# Patient Record
Sex: Female | Born: 1945 | Hispanic: No | Marital: Single | Smoking: Former smoker
Health system: Western US, Academic
[De-identification: ages and names within clinical notes are randomized; demographics above are authoritative.]

## PROBLEM LIST (undated history)

## (undated) DIAGNOSIS — I10 Essential (primary) hypertension: Secondary | ICD-10-CM

## (undated) DIAGNOSIS — N63 Unspecified lump in unspecified breast: Secondary | ICD-10-CM

## (undated) DIAGNOSIS — Z780 Asymptomatic menopausal state: Secondary | ICD-10-CM

## (undated) DIAGNOSIS — R300 Dysuria: Secondary | ICD-10-CM

## (undated) DIAGNOSIS — K529 Noninfective gastroenteritis and colitis, unspecified: Secondary | ICD-10-CM

## (undated) DIAGNOSIS — Z1231 Encounter for screening mammogram for malignant neoplasm of breast: Secondary | ICD-10-CM

## (undated) DIAGNOSIS — M25561 Pain in right knee: Secondary | ICD-10-CM

## (undated) DIAGNOSIS — S42442A Displaced fracture (avulsion) of medial epicondyle of left humerus, initial encounter for closed fracture: Secondary | ICD-10-CM

## (undated) DIAGNOSIS — G4733 Obstructive sleep apnea (adult) (pediatric): Secondary | ICD-10-CM

## (undated) DIAGNOSIS — M1711 Unilateral primary osteoarthritis, right knee: Secondary | ICD-10-CM

## (undated) DIAGNOSIS — Z1239 Encounter for other screening for malignant neoplasm of breast: Secondary | ICD-10-CM

## (undated) DIAGNOSIS — Z0000 Encounter for general adult medical examination without abnormal findings: Secondary | ICD-10-CM

## (undated) DIAGNOSIS — K219 Gastro-esophageal reflux disease without esophagitis: Secondary | ICD-10-CM

## (undated) DIAGNOSIS — Z1159 Encounter for screening for other viral diseases: Secondary | ICD-10-CM

## (undated) HISTORY — PX: LYMPHADENECTOMY: SHX15

## (undated) HISTORY — PX: CATARACT EXTRACTION: SHX1313B

## (undated) HISTORY — PX: APPENDECTOMY: SHX54

## (undated) HISTORY — PX: BREAST LUMPECTOMY: SHX2

## (undated) HISTORY — PX: OTHER PROCEDURE: U1053

## (undated) HISTORY — DX: Noninfective gastroenteritis and colitis, unspecified: K52.9

## (undated) MED ORDER — ZOSTER VAC RECOMB ADJUVANTED 50 MCG/0.5ML IM SUSR
0.50 mL | Freq: Once | INTRAMUSCULAR | 1 refills | Status: AC
Start: 2019-05-01 — End: 2019-05-01

## (undated) MED ORDER — ALBUTEROL SULFATE 108 (90 BASE) MCG/ACT IN AERS
INHALATION_SPRAY | RESPIRATORY_TRACT | Status: AC
Start: 2021-05-14 — End: ?

## (undated) MED ORDER — DIPHENOXYLATE-ATROPINE 2.5-0.025 MG OR TABS
1.0000 | ORAL_TABLET | Freq: Four times a day (QID) | ORAL | 1 refills | Status: AC | PRN
Start: 2020-01-04 — End: ?

## (undated) MED ORDER — SIMVASTATIN 10 MG OR TABS
ORAL_TABLET | ORAL | 1 refills | Status: AC
Start: 2021-08-12 — End: ?

## (undated) MED ORDER — ALBUTEROL SULFATE 108 (90 BASE) MCG/ACT IN AERS
INHALATION_SPRAY | RESPIRATORY_TRACT | Status: AC
Start: 2021-05-08 — End: ?

---

## 2010-07-09 LAB — HM DEXA SCAN

## 2011-11-09 HISTORY — PX: TOTAL KNEE ARTHROPLASTY: SHX125

## 2013-11-08 HISTORY — PX: SHOULDER OPEN ROTATOR CUFF REPAIR: SHX2407

## 2016-06-17 NOTE — Progress Notes (Addendum)
Visit Type:  Follow-up Visit   Referring Provider:  [name], MD   Primary Provider:  [name], MD         History of Present Illness:   Rhonda [name] comes to the office today for an acute appointment.  She had an insect bite on her right inner elbow and that is resolving now but it has taken over a week.  She wanted to have this evaluated.  She also has a mole on her right arm in the antecubital area that has significantly enlarged.  Her husband has noticed that she snores quite loudly and she has had witnessed apneas.  She is also due for a breast exam and mammogram.          0 = no chance of dozing.     1 = slight chance      2 = moderate chance   3 = high chance      1. Sitting and reading? 0      2. Watching TV? 1      3. Sitting inactive in a public place (e.g in a meeting)? 0      4. As a passenger in a car for an hour or more without a break? 3      5. Lying down to rest in the afternoon? 3      6. Sitting and talking to someone? 0      7. Sitting quietly after a lunch without alcohol? 1      8. In a car, while driving and stopped for a few minutes in traffic? 0      SCORE: 8                           Hypertension HPI    Denies:  chest pain, palpitations, dyspnea with exertion, orthopnea, PND, peripheral edema, visual symptoms, neurologic problems, syncope   Any medication side effects?  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  no    Have you been or are you currently exposed to second hand smoke?  no      For women, in the past year how often have you had 4 or more drinks a day? never   In the past year, how often have you used prescription drugs for non medical reasons? (example: getting high) never   In the past year, how often have you used illegal drugs?  never      Exercise    How many times per week do you usually exercise? 0  How intense is your typical exercise?  Light (stretching slow walking or housework)      Nutrition    How would you describe your eating habits? pretty healthy   Do you have any concerns about what you eat? no I do not have concerns   Is there anything you would like to change? no      Motor Vehicle Safety    Do you fasten your seat belt when you are in a vehicle?  yes   Do you ever drive after drinking,or ride with a driver who has been drinking? no   Do you talk on your mobile device while driving? no   Do you text on your mobile device while driving?  no      Sun Exposure    How often are you outdoors? frequently   Do you wear UV protection while outdoors? yes      Falls Risk Information    Falls in the past 6 months? no   Are there difficulties with balance? no         Review of Systems       General        Denies fever, chills, sweats and fatigue.      CV        Denies chest pain/pressure, palpitations, fainting, shortness of breath w/exertion and trouble laying flat in bed.      Resp        Denies cough, shortness of breath and wheezing.      GI        Denies nausea, vomiting, diarrhea and constipation.         Vital Signs    Care Management:    Eligible (02/06/2015 12:00:00 AM)   Transition of Care:  PCP   Patient Identity Verified with 2 Methods  Yes   Nursing/MA Comments: needs mmx ordered.    pt got bit by an ant.   lesion on right arm near inner elbow needs to be looked at.       Pain Assessment    Are you having significant pain today that you would like to discuss with  the provider you are seeing today?no   BP Location:  Right arm   BP Cuff Size:  Regular   Method:  Manual      Final BP: 150 / 82   Blood Pressure #1: 160/84 mm Hg   Blood Pressure #2: 150/82 mm Hg   Pulse #2 66   Pulse rhythm: regular   Height: 65 inches    Entered Weight 227 lb Calculated Weight: 227 lb.  103.18kg   Body Mass Index: 37.91      Body Surface Area (m2): 2.09      Documented By: Tia MaskerJessica Tunks CMA (June 15, 2016 9:09 AM)      Medications were reviewed and reconciled with the patient during this visit including over the counter medications.          Allergies:    CECLOR (Critical)   PREDNISONE (Critical)      Allergies were reviewed with the patient during this visit.                  Physical Exam      General:       well developed, well nourished, in no acute distress.     Head:       normocephalic and atraumatic.     Eyes:       PERRL/EOM  intact, conjunctiva and sclera clear.   Breasts:       no masses, adenopathy or nipple discharge.     Lungs:       clear bilaterally to auscultation.     Heart:       non-displaced PMI, chest non-tender; regular rate and rhythm, S1, S2 without murmurs, rubs, or gallops   Abdomen:        normal bowel sounds; no hepatosplenomegaly no ventral,umbilical hernias or masses noted.     Skin:       there is an area of resolving erythema on the medial elbow with no signs of cellulitis.  There is a mole that has enlarged greatly on her right lateral antecubital area.    Psych:       alert and cooperative; normal mood and affect; normal attention span and concentration.           Impression & Recommendations:      Problem # 1:  Insect bite (ICD-919.4) (ZOX09-U04.5)   She was reassured that this looks like it is healing well.  There is nothing to do at this time.       Problem # 2:  Skin lesion (ICD-709.9) (ICD10-L98.9)   She will schedule an appointment to have this excised.       Problem # 3:  Sleep apnea (ICD-780.57) (ICD10-G47.30)    She will be set up for a sleep study based on her witnessed apneas.    Orders:   Sleep Study (CPT-00000)         Problem # 4:  Other screening mammogram (ICD-V76.12) (WUJ81-X91.47)   Her mammogram is ordered today   Orders:   Screening Bilat Mammo-Other (CPT-G0202)                     Orders:   Added new Test order of Sleep Study (CPT-00000) - Signed   Added new Test order of Screening Bilat Mammo-Other (CPT-G0202) - Signed   Added new Service order of Ofc Vst, Est Lvl 4 ([id number]) - Signed            I have either personally documented or reviewed the patients past medical, surgical, family and social history and review of systems if present in the note and when recorded by clinical staff.         Electronically signed by [name] MD on 06/15/2016 at 10:29 AM   ________________________________________________________________________

## 2016-06-28 NOTE — Telephone Encounter (Signed)
Call Type    PRIORITY Normal      pt wants to know if she needs to take antibiotics prior to her procedure tomorrow. please advise. [name] CMA, Rhonda  June 28, 2016 1:45 PM      the pt staes that she has 4 of them on hand that she ususally takes them  prior to going to the dentist  or anything that she ahs done that is going to be breaking the skin since she has had a knee replacement. the best call back is [phone].Rhonda JubileeBooker, Rhonda  June 28, 2016 1:48 PM      Is this for her colonoscopy? If so, that doesn't break the skin so she doens't need to.  It honestly won't hurt if she does. Theador Jezewski MD, Judie GrieveBryan  June 28, 2016 2:08 PM      shes coming here for a procedure tomorrow. it just says "elbow" on the appt note so I dont know what its for. [name] CMA, Rhonda  June 28, 2016 2:45 PM      pt called back and is waiting to hear if she needs to take her antibiotics prior to her procedure tomorrow with you for her elbow. please advise. Rhonda JubileeBooker, Rhonda  June 28, 2016 3:32 PM       I spoke with Dr Juliann PulseLundquist then called the pt and let her know she should take the antibiotics prior to her procedure. Rhonda JubileeBooker, Rhonda  June 28, 2016 3:43 PM         Electronically signed by Rhonda JubileeKatina [name] on 06/28/2016 at 3:43 PM   ________________________________________________________________________

## 2016-07-07 LAB — HM MAMMOGRAPHY
Mammography, External: NORMAL
Mammography, External: NORMAL

## 2016-07-08 NOTE — Progress Notes (Addendum)
Vital Signs    Care Management:    Eligible (02/06/2015 12:00:00 AM)   Height: 65 inches      Final BP:  /                Suture Removal    Location of Sutures: right inner elbow   Sutures placed by:  Johnn HaiBryan Kahlen Boyde, MD   Sutures placed on:  06/29/2016   Number of sutures removed: 2   Wound Appearance healed, no signs of infection   Signs of infection? No   Complicated removal? No   Sutures removed by: Tia MaskerJessica Tunks CCMA       Sent to Provider for Review and Signature            Electronically signed by Johnn HaiBryan Shamila Lerch MD on 07/06/2016 at 8:18 AM   ________________________________________________________________________

## 2016-07-08 NOTE — Progress Notes (Addendum)
Visit Type:  procedure   Referring Provider:  Johnn HaiBryan Sariah Henkin, MD   Primary Provider:  Johnn HaiBryan Nicole Hafley, MD         History of Present Illness:   Rhonda [name] comes to the office today to have the skin lesion on her right antecubital fossa removed.  This has increased in size and has changed color and shape over the last year.  It has started to bleed on occasion as well.  She is concerned because of these sudden changes.                                     Depression Screening    Over the last 2 weeks has often been bothered by feeling down, depressed or hopeless:  No   Over the last 2 weeks has often been bothered by little interest or pleasure in doing things:  No      Tobacco Use    Never smoker      Do you use other tobacco products?  no   Have you been or are you currently exposed to second hand smoke?  no      For women, in the past year how often have you had 4 or more drinks a day? never   In the past year, how often have you used prescription drugs for non medical reasons? (example: getting high) never   In the past year, how often have you used illegal drugs?  never      Exercise    How many times per week do you usually exercise? 0   How intense is your typical exercise?  Light (stretching slow walking or housework)      Nutrition    How would you describe your eating habits? pretty healthy   Do you have any concerns about what you eat? no I do not have concerns   Is there anything you would like to change? no      Motor Vehicle Safety    Do you fasten your seat belt when you are in a vehicle?  yes   Do you ever drive after drinking,or ride with a driver who has been drinking? no   Do you talk on your mobile device while driving? no   Do you text on your mobile device while driving?  no      Sun Exposure    How often are you outdoors? frequently   Do you wear UV protection while outdoors? yes      Falls Risk Information    Falls in the past 6 months? no   Are there difficulties with balance? no          Review of Systems       General        Denies headaches, fever, chills and sweats.         Vital Signs    Care Management:    Eligible (02/06/2015 12:00:00 AM)   Transition of Care:  PCP   Patient Identity Verified with 2 Methods  Yes   Nursing/MA Comments: right arm near inner elbow. changing lesion      Pain Assessment    Are you having significant pain today that you would like to discuss with the provider you are seeing today?no   BP Location:  Right arm   BP Cuff Size:  Regular   Method:  Manual      Final  BP: 132 / 80   Blood Pressure: 132/80 mm Hg   Pulse #1 66   Pulse rhythm: regular   Height: 65 inches    Entered Weight 228 lb Calculated Weight: 228 lb.  103.64kg   Body Mass Index: 38.08      Body Surface Area (m2): 2.09      Documented By: Tia Masker CMA (June 29, 2016 8:18 AM)      Medications were reviewed and reconciled with the patient during this visit including over the counter medications.          Allergies:    CECLOR (Critical)   PREDNISONE (Critical)      Allergies were reviewed with the patient during this visit.                  Physical Exam      General:       well developed, well nourished, in no acute distress.     Skin:       There is a mole that has enlarged greatly on her right lateral antecubital area.          Impression & Recommendations:      Problem # 1:  Skin lesion (ICD-709.9) (ICD10-L98.9)   A time out was performed prior to the minimally invasive procedure checking for correct patient, correct site, correct procedure and necessary equipment, including ,if applicable, the correct  needle length and technique.  An informed consent was reviewed and signed by the patient. The area was then prepped in sterile fashion and 10ml of 1% lidocaine with epinephrine was used to achieve adequate anesthesia.  After adequate anesthesia was verified an elliptical incision was used to excise the lesion which was 1x1x3cm and it was placed in a formain containing  specimen cup.  Hemostasis was achieved partially with TCA and then with closure of the incision.  4-0 ethilon was used and a mattress was placed to alleviate tension on the incision and then a running subcuticular suture was used for final closure.  A sterile dressing was placed.  She tolerated the procedure well, and post procedure instructions were given. She will follow up in 7-10 days for suture removal.    Orders:   Excision, benign lesion, trunk, arms or legs; excised diameter 2.1 to 3.0 cm (11403)                     Orders:   Added new Service order of Excision, benign lesion, trunk, arms or legs; excised diameter 2.1 to 3.0 cm (11403) - Signed            I have either personally documented or reviewed the patients past medical, surgical, family and social history and review of systems if present in the note and when recorded by clinical staff.         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, including shoulder (ICD-173.61) (ICD10-C44.612)   Orders:   Added new Service order of Excision;malignant les(exc skin tag)trunk,arms,legs 2.1 to 3.0 cm ([id number]) - Signed                     Electronically signed by [name] MD on 07/06/2016 at 11:45 AM   ________________________________________________________________________   Clinical Lists Changes  Orders:   Added new Service order of Excision;malignant les(exc skin tag)trunk,arms,legs 2.1 to 3.0 cm ([id number]) - Signed                     Electronically signed by [name] MD on 07/08/2016 at 12:33 PM   ________________________________________________________________________

## 2016-07-13 NOTE — Telephone Encounter (Addendum)
Call Type    Admin/Sched   PRIORITY Normal   Initial Call Started: [name],  June 22, 2016 10:57 AM   Rescheduled/Cancelled Appt Comment pts husband claled to schedk status of sleep study, he is very upset that these havent been taken care of yet, tried to explain the process hre didnt want to hear it, there is a note in his chart as well             I have sent husbands doc to [facility]. Tunks CMA, [name]  June 22, 2016 11:14 AM      [name], have you talked to either of them yet? Milayah Krell MD, [name]  June 22, 2016 11:46 AM      I called [facility] and they will call the patient to schedule. Referral faxed to [facility] at fax [phone] per their request. ..................................................................Marland KitchenDenna Haggard  June 22, 2016 11:47 AM      I have not.  I will touch base with them at the end of this week.  It sounds like it's moving along. [name]  June 22, 2016 12:44 PM      Thank you. [name] MD, [name]  June 22, 2016 1:12 PM         Electronically signed by [name] MD on 06/22/2016 at 1:12 PM   ________________________________________________________________________   [name].  Pt is scheduled on 9/11 at 8pm.          Electronically signed by [name] on 07/13/2016 at 8:03 AM   ________________________________________________________________________

## 2016-07-19 NOTE — Telephone Encounter (Addendum)
Call Type    Admin/Sched   PRIORITY Normal   Initial Call Started: Rhonda [name],  July 16, 2016 8:10 AM   Rescheduled/Cancelled Appt Comment Dr Juliann PulseLundquist has to be out on Monday, please reschedule this patient.       I called and spoke with [name] and explained to him that I was calling because pt. has an appt. on Monday with Dr. Juliann PulseLundquist and he is going to be out of the office that day and was calling to reschedule.  Rhonda [name] was upset and told me to just cancel the appt. all together because they called on Wednesday and Dr. Juliann PulseLundquist was not in that day and they had to wait until Monday for her to be seen, and now he's not going to be in the office again and they have to reschedule.  He said to just forget it and cancel the appt.  I have cancelled pts. appt.   Rhonda [name], Rhonda [name]  July 16, 2016 8:46 AM         Electronically signed by Rhonda Leaderhristy [name] Rhonda [name] on 07/16/2016 at 8:46 AM   Electronically signed by Rhonda HaiBryan Trever Streater MD on 07/18/2016 at 11:55 PM   ________________________________________________________________________

## 2016-08-23 LAB — HM COLONOSCOPY

## 2016-10-22 NOTE — Progress Notes (Signed)
Visit Type:  Follow-up Visit   Referring Provider:  [name], MD   Primary Provider:  [name], MD         History of Present Illness:   Rhonda [name] comes to the office today for a follow up appointment on her recent sleep study.  She informed me that she was told she has significant sleep apnea and she was started on positive pressure therapy for the titration phase.  She was amazed at what a difference it made in her sleep in just a short amount of time.  She has a number of questions about CPAP and OSA and how this process works.                                     Depression Screening    Over the last 2 weeks has often been bothered by feeling down, depressed or hopeless:  No   Over the last 2 weeks has often been bothered by little interest or pleasure in doing things:  No      Tobacco Use    Never smoker      Do you use other tobacco products?  no   Have you been or are you currently exposed to second hand smoke?  no      For women, in the past year how often have you had 4 or more drinks a day? never   In the past year, how often have you used prescription drugs for non medical reasons? (example: getting high) never   In the past year, how often have you used illegal drugs?  never      Exercise    How many times per week do you usually exercise? 0   How intense is your typical exercise?  Light (stretching slow walking or housework)      Nutrition    How would you describe your eating habits? pretty healthy   Do you have any concerns about what you eat? no I do not have concerns   Is there anything you would like to change? no      Motor Vehicle Safety    Do you fasten your seat belt when you are in a vehicle?  yes   Do you ever drive after drinking,or ride with a driver who has been drinking? no   Do you talk on your mobile device while driving? no   Do you text on your mobile device while driving?  no      Sun Exposure    How often are you outdoors? frequently    Do you wear UV protection while outdoors? yes      Falls Risk Information    Falls in the past 6 months? no   Are there difficulties with balance? no            Vital Signs    Care Management:    Eligible (02/06/2015 12:00:00 AM)   Transition of Care:  PCP   Patient Identity Verified with 2 Methods  Yes   Nursing/MA Comments: follow up   failed sleep test.   trouble with hips, arthritis? [id number]      Pain Assessment    Are you having significant pain today that you would like to discuss with the provider you are seeing today?no   BP Location:  Right arm   BP Cuff Size:  Regular   Method:  Manual  Final BP: 132 / 78   Blood Pressure: 132/78 mm Hg   Pulse #1 66   Pulse rhythm: regular   Height: 65 inches    Entered Weight 226 lb Calculated Weight: 226 lb.  102.73kg   Body Mass Index: 37.74      Body Surface Area (m2): 2.09      Documented By: Tia MaskerJessica Tunks CCMA (August 12, 2016 7:59 AM)      Medications were reviewed and reconciled with the patient during this visit including over the counter medications.          Allergies:    CECLOR (Critical)   PREDNISONE (Critical)      Allergies were reviewed with the patient during this visit.                  Physical Exam      General:       well developed, well nourished, in no acute distress.     Head:       normocephalic and atraumatic.     Psych:       alert and cooperative; normal mood and affect; normal attention span and concentration.           Impression & Recommendations:      Problem # 1:  Sleep apnea (ICD-780.57) (ICD10-G47.30)   The majority of today's 45 minute appointment was spent in counseling and coordination of care. We discussed OSA and the associated complications of this diagnosis.  We also discussed obstrutive vs central sleep apnea as that came up in discussion at the sleep lab.  We further discussed how the devices work and the differences between an autoset and a fixed CPAP /  BiPAP as well as the difference between these and an ASV device.  She will be set up with a device as soon as I have the report from the sleep lab.                   Orders:   Added new Service order of Ofc Vst, Est Lvl 5 ([id number]) - Signed            I have either personally documented or reviewed the patients past medical, surgical, family and social history and review of systems if present in the note and when recorded by clinical staff.         Electronically signed by Johnn HaiBryan Zayden Maffei MD on 08/12/2016 at 8:52 AM   ________________________________________________________________________   Flu Shot Visit            Influenza Vaccine      Vaccine Type: Fluzone PF Quadrivalent      Site: L arm      Mfr: Sanofi Pasteur      Dose: 0.5 ml      Route: IM      Given by: Tia MaskerJessica Tunks CCMA      Exp. Date: 05/07/2017      Lot #: [id number]      VIS given: 06/14/14 version given August 13, 2016.      Flu Vaccine Consent Questions       Do you have a history of severe allergic reactions to this vaccine? no      Any prior history of allergic reactions to egg and/or gelatin? no      Do you have a sensitivity to the preservative Thimersol? no      Do you have a past history of Guillan-Barre Syndrome? no  Do you currently have an acute febrile illness? no      Have you ever had a severe reaction to latex? no      Vaccine information given and explained to patient? yes      Are you currently pregnant? no         Orders:   Added new Service order of Fluzone PF Quadrivalent ([id number]) - Signed   Added new Service order of Immunization admin #1 [phone]([id number]) - Signed      A time out was performed checking for correct patient; correct vaccine or diluents; correct time (includes administering at correct age, appropriate interval and before vaccine or diluent expires); correct dosage; correct route, needle length and technique; correct site and correct documentation.  It was verified that the patient is not allergic to the  vaccine and/or medication being given. The patient is not currently moderately or severely ill.         Electronically signed by Tia MaskerJessica Tunks CCMA on 08/13/2016 at 9:13 AM   Electronically signed by Johnn HaiBryan Dorsie Sethi MD on 08/13/2016 at 9:43 AM   ________________________________________________________________________   Update Service Orders         Orders:   Added new Service order of Fluzone PF Quadrivalent ([id number]) - Signed   Added new Service order of Immunization admin #1 ([id number]) - Signed               Influenza Vaccine      Vaccine Type: Fluzone PF Quadrivalent      Site: R arm      Mfr: Sanofi Pasteur      Dose: 0.5 ml      Route: IM      Given by: Tia MaskerJessica Tunks CCMA      Exp. Date: 05/07/2017      Lot #: [id number]      VIS given: 06/14/14 version given September 02, 2016.      Flu Vaccine Consent Questions       Do you have a history of severe allergic reactions to this vaccine? no      Any prior history of allergic reactions to egg and/or gelatin? no      Do you have a sensitivity to the preservative Thimersol? no      Do you have a past history of Guillan-Barre Syndrome? no      Do you currently have an acute febrile illness? no      Have you ever had a severe reaction to latex? no      Vaccine information given and explained to patient? yes      Are you currently pregnant? no         A time out was performed checking for correct patient; correct vaccine or diluents; correct time (includes administering at correct age, appropriate interval and before vaccine or diluent expires); correct dosage; correct route, needle length and technique; correct site and correct documentation.  It was verified that the patient is not allergic to the vaccine and/or medication being given. The patient is not currently moderately or severely ill.      this was put in on the wrong service date, so this is why it has been changed.Ramiro Harvest. Tunks CCMA, [name]  September 02, 2016 8:25 AM          Electronically signed by Tia MaskerJessica Tunks CCMA on 09/02/2016 at 8:25 AM   Electronically signed by Johnn HaiBryan Treylan Mcclintock MD on 09/02/2016 at 8:29 AM   ________________________________________________________________________

## 2016-10-22 NOTE — Progress Notes (Signed)
Visit Type:  Follow-up Visit   Referring Provider:  Johnn HaiBryan Calil Amor, MD   Primary Provider:  Johnn HaiBryan Keeya Dyckman, MD         History of Present Illness:   Rhonda MccreedyBarbara comes to the office today for an acute visit due to pain in her left buttock.  She has not had any injuries that she is aware of but had a sudden onset of pain in her left buttock occur about a week ago.  This has limited her ability to walk and her ability to get up and down from a seated position.  She does not have any numbness or tingling associated with this. She has tried OTC medications and heat and these have not been effective in alleviating her symptoms.                                     Depression Screening    Over the last 2 weeks has often been bothered by feeling down, depressed or hopeless:  No   Over the last 2 weeks has often been bothered by little interest or pleasure in doing things:  No      Tobacco Use    Never smoker      Do you use other tobacco products?  no   Have you been or are you currently exposed to second hand smoke?  no      For women, in the past year how often have you had 4 or more drinks a day? never   In the past year, how often have you used prescription drugs for non medical reasons? (example: getting high) never   In the past year, how often have you used illegal drugs?  never      Exercise    How many times per week do you usually exercise? 0   How intense is your typical exercise?  Light (stretching slow walking or housework)      Nutrition    How would you describe your eating habits? pretty healthy   Do you have any concerns about what you eat? no I do not have concerns   Is there anything you would like to change? no      Motor Vehicle Safety    Do you fasten your seat belt when you are in a vehicle?  yes   Do you ever drive after drinking,or ride with a driver who has been drinking? no   Do you talk on your mobile device while driving? no   Do you text on your mobile device while driving?  no       Sun Exposure    How often are you outdoors? frequently   Do you wear UV protection while outdoors? yes      Falls Risk Information    Falls in the past 6 months? no   Are there difficulties with balance? no         Review of Systems       General        Denies headaches, fever, chills, sweats and fatigue.         Vital Signs    Care Management:    Eligible (02/06/2015 12:00:00 AM)   Transition of Care:  PCP   Patient Identity Verified with 2 Methods  Yes   Nursing/MA Comments: left hip bothering. last few days been getting worse. has been hurting for a while. no known  injury or fall. ROM is decreased. hurts to get into car      Pain Assessment    Are you having significant pain today that you would like to discuss with the provider you are seeing today?yes   BP Location:  Right arm   BP Cuff Size:  Regular   Method:  Manual      Final BP: 126 / 80   Blood Pressure: 126/80 mm Hg   Pulse #1 64   Pulse rhythm: regular   Height: 65 inches    Entered Weight 231 lb Calculated Weight: 231 lb.  105.00kg   Body Mass Index: 38.58      Body Surface Area (m2): 2.10      Documented By: Tia MaskerJessica Tunks CCMA (September 23, 2016 2:57 PM)      Medications were reviewed and reconciled with the patient during this visit including over the counter medications.          Allergies:    CECLOR (Critical)   PREDNISONE (Critical)      Allergies were reviewed with the patient during this visit.                  Physical Exam      General:       well developed, well nourished, in no acute distress.     Head:       normocephalic and atraumatic.     Eyes:       PERRL/EOM intact, conjunctiva and sclera clear.   Msk:       Examination of her left buttock demonstrates tenderness over the left piriformis muscle which is in spasm.  Oppositional testing of piriformis also reproduces her pain.    Psych:       alert and cooperative; normal mood and affect; normal attention span and concentration.           Impression & Recommendations:       Problem # 1:  Piriformis muscle spasm, left (ICD-728.85) (ICD10-M62.838)   As she has failed OTC NSAIDs, she will be treated with steroids and will utilize ice massage.  She will let me know in 10 days how she is doing and if she has not had significant improvement, she will be referred to PT for further evaluation and management.       Medications Added to Medication List This Visit:   1)  Medrol 4 Mg Oral Tablet Therapy Pack (Methylprednisolone) .... Take as directed, tapering over 6 days         Prescriptions:   MEDROL 4 MG ORAL TABLET THERAPY PACK (METHYLPREDNISOLONE) Take as directed, tapering over 6 days  #1[Package] x 0       Entered and Authorized by:  Johnn HaiBryan Brigido Mera MD       Electronically signed by:   Johnn HaiBryan Lyliana Dicenso MD on 09/23/2016       Method used:    Electronically to                [facility]* (retail)               [address]               [place], [place]  [id number]               Ph: [phone]               Fax: [phone]       RxID:   [id number]  Orders:   Added new Service order of Ofc Vst, Est Lvl 4 ([id number]) - Signed            I have either personally documented or reviewed the patients past medical, surgical, family and social history and review of systems if present in the note and when recorded by clinical staff.         Electronically signed by [name] MD on 09/23/2016 at 3:26 PM   ________________________________________________________________________

## 2016-10-22 NOTE — Progress Notes (Signed)
Visit Type:  Follow-up Visit   Referring Provider:  [name], MD   Primary Provider:  [name], MD         History of Present Illness:   Rhonda [name] comes to the office today for a routine follow up appointment on her OSA, HTN, and buttock pain.  She reports that her OSA is well controlled and she is pleased that the device is working as well as it has been. She has not had any problems with the machine from her end of things, though apparently it did have a bad modem and didn't transmit for a while.  She feels that her blood pressure is well controlled and she has not had symtpoms of elevation or orthostasis.  She did take the medrol dose pack for her piriformis syndrome, and that was helpful the first few days.  However, when she reduced the dose, she noticed return of pain, but she also started to have headaches and other side effects from the medication, thus she stopped it.  She has been taking her meloxicam and that has not been beneficial.  She has not been to PT for this issue in the past.                            Hypertension HPI    Denies:  headache, chest pain, palpitations, dyspnea with exertion, orthopnea, PND, peripheral edema, visual symptoms, neurologic problems, syncope   Any medication side effects?  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  no   Have you been or are you currently exposed to second hand smoke?  no       For women, in the past year how often have you had 4 or more drinks a day? never   In the past year, how often have you used prescription drugs for non medical reasons? (example: getting high) never   In the past year, how often have you used illegal drugs?  never      Exercise    How many times per week do you usually exercise? 0   How intense is your typical exercise?  Light (stretching slow walking or housework)      Nutrition    How would you describe your eating habits? pretty healthy   Do you have any concerns about what you eat? no I do not have concerns   Is there anything you would like to change? no      Motor Vehicle Safety    Do you fasten your seat belt when you  are in a vehicle?  yes   Do you ever drive after drinking,or ride with a driver who has been drinking? no   Do you talk on your mobile device while driving? no   Do you text on your mobile device while driving?  no      Sun Exposure    How often are you outdoors? frequently   Do you wear UV protection while outdoors? yes      Falls Risk Information    Falls in the past 6 months? no   Are there difficulties with balance? no         Review of Systems       General        Denies headaches, fever, chills, sweats and fatigue.      CV        Denies chest pain/pressure, palpitations, fainting, shortness of breath w/exertion, trouble laying flat in bed, wake up at night/can't breathe and leg cramps.      Resp        Denies cough, shortness of breath and wheezing.         Vital Signs    Care Management:    Eligible (02/06/2015 12:00:00 AM)   Transition of Care:  PCP   Patient Identity Verified with 2 Methods  Yes   Nursing/MA Comments: left leg pain, radiates to calf. hard time walking- throwing off right knee.    cpap seems to be working.       Pain Assessment    Are you having significant pain today that you would like to discuss with the provider you are seeing today?yes   BP Location:  Right arm   BP Cuff Size:  Regular   Method:  Manual       Final BP: 132 / 80   Blood Pressure: 132/80 mm Hg   Pulse #1 68   Pulse rhythm: regular   Height: 65 inches    Entered Weight 229 lb Calculated Weight: 229 lb.  104.09kg   Change since last visit: -2 pounds    Body Mass Index: 38.25      Body Surface Area (m2): 2.10      Documented By: Tia MaskerJessica [name] CCMA (October 07, 2016 7:38 AM)      Medications were reviewed and reconciled with the patient during this visit including over the counter medications.          Allergies:    CECLOR (Critical)   PREDNISONE (Critical)      Allergies were reviewed with the patient during this visit.                  Physical Exam      General:       well developed, well nourished, in no acute distress.     Head:       normocephalic and atraumatic.     Eyes:       PERRL/EOM intact, conjunctiva and sclera clear.   Lungs:       clear bilaterally to auscultation.     Heart:       non-displaced PMI, chest non-tender; regular rate and rhythm, S1, S2 without murmurs, rubs, or gallops   Abdomen:        normal bowel sounds; no hepatosplenomegaly no ventral,umbilical hernias or masses noted.     Psych:       alert and cooperative; normal mood and affect; normal attention span and concentration.  Impression & Recommendations:      Problem # 1:  Piriformis muscle spasm, left (ICD-728.85) (ICD10-M62.838)   We dicussed that use of muscle relaxant medications and steroids is usually helpful.  However, she is intolerant to steroids at this point in time.  She does have Cyclobenzaprine at home and she will start taking this.  I will refer her to PT and she will also see Dr. Darius BumpSaulter DC.  To help get the pain settled for PT and the chiropractor, she will be prescribed Vicoden for a week.       Problem # 2:  Sleep apnea (ICD-780.57) (ICD10-G47.30)   Excellent control on her current CPAP settings.  The download was reviewed and will be scanned into the chart.       Problem # 3:  Hypertension, benign (ICD-401.1) (ICD10-I10)    Well managed, no changes required at this time.       Her updated medication list for this problem includes:      Lisinopril-hydrochlorothiazide 20-25 Mg Oral Tablet (Lisinopril-hydrochlorothiazide) .Marland Kitchen.... 1 tablet by mouth once a day      Lisinopril 20 Mg Oral Tablet (Lisinopril) .Marland Kitchen.... 1 tablet by mouth once a day at bedtime         Medications Added to Medication List This Visit:   1)  Hydrocodone-acetaminophen 10-325 Mg Oral Tablet (Hydrocodone-acetaminophen) .... One every six hours as needed for severe pain.  acute pain         Prescriptions:   HYDROCODONE-ACETAMINOPHEN 10-325 MG ORAL TABLET (HYDROCODONE-ACETAMINOPHEN) one every six hours as needed for severe pain.  Acute pain  #20 x 0       Entered and Authorized by:  Johnn HaiBryan Herbie Lehrmann MD       Electronically signed by:   Johnn HaiBryan Korbin Mapps MD on 10/07/2016       Method used:    Print then Give to Patient       RxID:   [id number]               Orders:   Added new Service order of Ofc Vst, Est Lvl 4 ([id number]) - Signed            I have either personally documented or reviewed the patients past medical, surgical, family and social history and review of systems if present in the note and when recorded by clinical staff.         Electronically signed by Johnn HaiBryan Saveon Plant MD on 10/07/2016 at 8:35 AM   ________________________________________________________________________

## 2016-10-22 NOTE — Telephone Encounter (Signed)
Patient Identity Verified with 2 Methods        Call Type    VM   PRIORITY Normal   Initial Call Started: Ella JubileeKatina Booker,  October 15, 2016 12:54 PM   Spoke to: Spouse phillip    Call For: Johnn HaiBryan Oluwatimileyin Vivier MD    Other    [phone]   Date of Voicemail 10/15/2016   VM Message:   the pt husband Aneta Minshillip called and left a VM the pt is worse the pain has not subsided since the pt has seen Dr Juliann PulseLundquist a few weeks ago. the pain is worse and she can hardly move.  she has had 4 visits with Dr Darius Bumpsaulter and he is not sure what it is . he thinks it could be a pinched nerve or goes back to a fall she had down the stairs aways back and nothing was done about it. he said that the pain meds that she has she can take it makes her sick and she can not do anything. he thinks it is time to do something else such as xrays or a MRI and have it set up emideatly. he wants a call asap.          Follow-up for Phone Call    Follow-up Details: pc to pt.  She doesnt have any numbness or weakness in her legs,  The pain goes from middle of back down the outside of the left side.  Worse standing, couldn't sleep, used icepacks all night, got into recliner and was able to doze off and on.  She saw the chiropracter today and there was no pain with the treatment.  13 years ago she fell down 13 steps and didnt get propper tx.  Pt has (per spouse) herniated disk, bulging disk etc.  chiropracter says pelvic is out of line with spine so possibly a pinched nerve.        Dr Juliann PulseLundquist gave her Vicodin 1 tab every 6 hours but the first she took made her sick to her stomach.  so she has tried using half at a time and it makes her kind of disoriented and sick to her stomach.      They are using ice packs, pillow under knees as well.  would like MRI and additional information please.      Please call spouse.   Follow-up by: Annamary CarolinSharon McCarthy CMA, AAMA,  October 15, 2016 2:35 PM         New Orders:   Referral - PT [CPT-000000]   LS Spine xray [[id number]]       I don't see what happened to the PT order, but I have resent this.  I have also entered an order for a LS spine radiograph. Nyanna Heideman MD, Judie GrieveBryan  October 15, 2016 4:18 PM      pc to pt-got answering machine and left specific message regarding PT (explained pt is  NOT chirporactic service) [facility] and asked them to call there Monday to see if they can expedite appt.  also explained the [facility] for the [facility] and asked them to go in as walk-in as soon as possible.  they should call the hospital and ask if possible to get done on Saturday but if not to go up Monday and again it is as a Walk In.      THey will touch base here on Monday also to update.      ..................................................................Marland Kitchen.Annamary CarolinSharon McCarthy CMA, AAMA  October 15, 2016 4:27 PM         Electronically signed by Annamary CarolinSharon McCarthy CMA, AAMA on 10/15/2016 at 4:28 PM   Electronically signed by Johnn HaiBryan Lorielle Boehning MD on 10/15/2016 at 4:58 PM   ________________________________________________________________________   husband called and requested [facility]- [facility].          Electronically signed by Cleda Mccreedyristina Lopez CMA AAMA on 10/15/2016 at 4:39 PM   Electronically signed by Johnn HaiBryan Loletha Bertini MD on 10/15/2016 at 4:58 PM   ________________________________________________________________________

## 2016-10-25 NOTE — Telephone Encounter (Addendum)
Call Type    Admin/Sched   PRIORITY Normal   Initial Call Started: Rhonda [name],  October 22, 2016 12:40 PM   Rescheduled/Cancelled Appt Comment pts husband called pt is in alot of pain, she got a message about the Vicoden and getting PT, they want something besides the Vicoden for pain. There must be something that can be done for the pain, the chiropractor says she will improve but in the mean time they would like to have a referral back to pain mgmt with Dr Janetta HoraZolper for ASAP scheduling she can be seen next week if you do this for them today and referrals sends it to them today  call phil when this has been done on [phone]        so to be clear what they are asking for is a referral to Dr Janetta HoraZolper put through referrals asap so she can be seen next week, also something for the pain besides the vicoden, they do not want a referral to PT as the chiropractor says she will improve   Rhonda Calamityaylor, Rhonda [name]  October 22, 2016 12:44 PM          New Problems:   Lumbar spondylosis (ICD-721.3) (ICD10-M47.816)         New Orders:   Referral-Pain Mgmt [CPT-00000]      I have sent the order to pain management, but there is not much they are going to do as stable spine imaging for 5 years does not warrant injection therapy.  Her current issue is muscular and PT will help with this and can help her get better faster.  There is not much else that I can give her other than Vicoden as she has been intolerant to other medications that could be helpful.  She is already on an NSAID (Meloxicam) and was taking left over muscle relaxants (Cyclobenzaprine).  She will get better in time, Dr. Harlan StainsSalter is correct. Rhonda Gutzmer MD, Judie GrieveBryan  October 23, 2016 3:49 PM      I have responded to pt through email, and told her the above message. Tunks Eartha InchCCMA, [name]  October 25, 2016 8:30 AM         Electronically signed by Tia MaskerJessica Tunks CCMA on 10/25/2016 at 8:30 AM   Electronically signed by Johnn HaiBryan Josphine Laffey MD on 10/25/2016 at 9:01 AM    ________________________________________________________________________

## 2016-10-29 NOTE — Telephone Encounter (Addendum)
Patient Identity Verified with 2 Methods        Call Type    Admin/Sched   PRIORITY Normal   Initial Call Started: Rhonda JubileeKatina Booker,  October 27, 2016 10:34 AM      [facility] fax number [phone]. the fax said her husband the pt is going to pain managment and does not want PT at this time. I put the fax in the MA box.Rhonda [name], Rhonda [name]  October 27, 2016 10:35 AM      Spoke to ManilaPhilip who confirms that pt does not want PT- he states that he thinks [facility] has made a misdiagnosis. He states that he cx'd with PT and she has appt with Pain Management tomorrow. Order cx'd. ..................................................................Marland Kitchen.Cleda MccreedyCristina Lopez CMA AAMA  October 27, 2016 10:52 AM         Electronically signed by Cleda Mccreedyristina Lopez CMA AAMA on 10/27/2016 at 10:52 AM   Electronically signed by Johnn HaiBryan Isael Stille MD on 10/28/2016 at 5:15 PM   ________________________________________________________________________

## 2016-11-12 NOTE — Telephone Encounter (Signed)
Patient Identity Verified with 2 Methods        Call Type    Admin/Sched   PRIORITY Normal   Initial Call Started: Rhonda [name],  November 12, 2016 11:05 AM   Spoke to: Spouse   Call For: Rhonda HaiBryan Landy Dunnavant [name]   [facility] [phone]            New Orders:   MRI Lumbar w/o Contrast [[id number]]the pt husband called and the pt is getting worse as far has her back and leg pain. she has been going to the chiropractor and the pain clinic. when she went to the chiropractor today and he looked at the xray results he said that there was a compression fracture of the spine at one point. the pt , her husband and the chiropractor all agree they would like a MRI done to see why the pt is not getting better but worse. they would like the order sent [facility] it is the closes and they should be able to get in there sooner then around here and they are only 7 miles away. please let me know if this order is put in so i can call him and let him know. he would like to hear something back today. Grace Bushy.[name], Rhonda  November 12, 2016 11:11 AM      can you do this order for this? Tunks CCMA, [name]  November 12, 2016 11:17 AM      have they discussed this with pain management yet?  They are currently managing this back pain. [name], Rhonda [name]  November 12, 2016 12:32 PM      talked with husband, when she went to pain management, they were expecting her to have had one done before she went there, so one needs to be ordered. phil wants me to get back to him Today. Tunks CCMA, [name]  November 12, 2016 1:21 PM      Michele Mcalpinehil is the one who insisted that she be seen at Pain Management ASAP and called Pain management to set up the appointment, there was not time to order an MRI prior to her being seen.  In the usual course of events one would have been ordered, but this was circumvented.  I expected that Pain Management may order one upon seeing her, but apparently they have not.   An MRI has been ordered. Rhonda [name], Rhonda [name]  November 12, 2016 1:42 PM      talked to pts husband, he [facility], and asap. I have sent a flag to [facility]. Tunks Eartha InchCCMA, [name]  November 12, 2016 2:02 PM         Electronically signed by Tia MaskerJessica Tunks CCMA on 11/12/2016 at 2:03 PM   Electronically signed by Rhonda HaiBryan Yoshiye Kraft [name] on 11/12/2016 at 2:06 PM   ________________________________________________________________________

## 2017-01-11 ENCOUNTER — Ambulatory Visit: Admit: 2017-01-11 | Discharge: 2017-01-11 | Payer: MEDICARE | Attending: Family Medicine | Primary: Internal Medicine

## 2017-01-11 DIAGNOSIS — G4733 Obstructive sleep apnea (adult) (pediatric): Secondary | ICD-10-CM

## 2017-01-11 NOTE — Progress Notes (Signed)
[place]   [place] [address]  [place]   Rhonda [name] is a 71 y.o. female who presents to clinic for Hypertension ( follow up) and Back Pain.    HPI   She comes to the office today for a routine follow up appointment.  She feels that she is doing well with her OSA and CPAP management.  She has been wearing her mask nightly and her download shows good compliance and a normal AHI.  She awakes feeling well rested in the AM.  She has not had any issues with her blood pressure. There have been no symptoms of elevated blood pressure such as headaches, palpitations, chest pain, epistaxis, or vision changes; nor have there been symptoms of orthostasis or dizziness that would indicate hypotension.  Medications are taken regularly and are well tolerated without side effects. Her GERD remains well controlled.  She has surgery scheduled for her back on Monday and is hopeful that this will resolve her pain.     HPI    Cardiovascular Review:  The patient has hypertension.  Diet and Lifestyle: generally follows a low fat low cholesterol diet, sedentary  Home BP Monitoring: is not measured at home.  Pertinent ROS: taking medications as instructed, no medication side effects noted, no TIA's, no chest pain on exertion, no dyspnea on exertion, no swelling of ankles.   Patient Active Problem List    Diagnosis Date Noted   ??? GERD without esophagitis 01/11/2017   ??? Hypertension, benign 01/06/2017   ??? Osteoarthritis 01/06/2017   ??? Lumbar spondylosis 10/22/2016   ??? Basal cell carcinoma of skin of right upper limb, including shoulder 07/06/2016   ??? Other screening mammogram 06/15/2016   ??? Skin lesion 06/15/2016   ??? Obstructive sleep apnea syndrome 06/15/2016   ??? Rotator cuff tear, left 10/21/2015   ??? Encounter for general adult medical examination without abnormal findings 12/06/2014   ??? Seborrheic keratosis 07/08/2014    ??? Mild intermittent asthma 11[birth year]/1958     Current Outpatient Prescriptions   Medication Sig Dispense Refill   ??? fluticasone-salmeterol (ADVAIR DISKUS) 250-50 mcg/dose diskus inhaler Inhale 1 dose twice a day     ??? cpap machine kit Autoset CPAP 6-10cmH2O.     ??? albuterol-ipratropium (DUO-NEB) 2.5 mg-0.5 mg/3 ml nebu Use every 4 hours as needed for dyspena     ??? lisinopril (PRINIVIL, ZESTRIL) 20 mg tablet 1 tablet by mouth once a day at bedtime     ??? lisinopril-hydroCHLOROthiazide (PRINZIDE, ZESTORETIC) 20-25 mg per tablet 1 tablet by mouth once a day     ??? meloxicam (MOBIC) 15 mg tablet 1/2 tablet by mouth twice a day as directed     ??? albuterol (PROVENTIL HFA) 90 mcg/actuation inhaler Inhale 2 puffs four times a day if needed     ??? raNITIdine (ZANTAC) 300 mg tablet One twice daily as needed for GERD     ??? HYDROcodone-acetaminophen (NORCO) 10-325 mg tablet One every six hours as needed for severe pain.  Acute pain     ??? nystatin (MYCOSTATIN) topical cream Apply to affected area up to 3 times daily       Allergies   Allergen Reactions   ??? Cefaclor Not Reported This Time   ??? Prednisone Not Reported This Time     Past Medical History:   Diagnosis Date   ??? Asthma    ???  HTN (hypertension)    ??? Hx of colonoscopy 2005     Past Surgical History:   Procedure Laterality Date   ??? HX CATARACT REMOVAL Left    ??? HX ROTATOR CUFF REPAIR  2007    Dr. Mickie Bail - [place]   ??? HX TONSILLECTOMY  1952     Family History   Problem Relation Age of Onset   ??? No Known Problems Son    ??? No Known Problems Daughter      Social History   Substance Use Topics   ??? Smoking status: Never Smoker   ??? Smokeless tobacco: Never Used   ??? Alcohol use Yes      Comment: Rare social ETOH          REVIEW OF SYSTEMS   Review of Systems   Constitutional: Negative for chills, diaphoresis, fever and malaise/fatigue.   HENT: Negative for hearing loss.    Eyes: Negative for blurred vision and double vision.    Respiratory: Negative for cough, shortness of breath and wheezing.    Cardiovascular: Negative for chest pain, palpitations and leg swelling.   Gastrointestinal: Negative for abdominal pain, constipation, diarrhea, nausea and vomiting.   Genitourinary: Negative for dysuria, frequency and urgency.   Skin: Negative for itching and rash.   Neurological: Negative for dizziness and headaches.         PHYSICAL EXAM     Vitals:    01/11/17 0815   BP: 138/84   Pulse: 66   Weight: 223 lb (101.2 kg)   Height: '5\' 5"'  (1.651 m)       Physical Exam   Constitutional: She is well-developed, well-nourished, and in no distress.   HENT:   Head: Normocephalic and atraumatic.   Eyes: Conjunctivae and EOM are normal. Pupils are equal, round, and reactive to light.   Cardiovascular: Normal rate, regular rhythm, S1 normal, S2 normal and normal heart sounds.  Exam reveals no gallop and no friction rub.    No murmur heard.  Pulmonary/Chest: Breath sounds normal. She has no wheezes. She has no rales.   Abdominal: Soft. Bowel sounds are normal. She exhibits no distension. There is no tenderness.   Psychiatric: Affect normal.       LABS/IMAGING     No results found for any visits on 01/11/17.      Assessment and Plan       ICD-10-CM ICD-9-CM    1. Obstructive sleep apnea syndrome G47.33 327.23    2. Lumbar spondylosis M47.816 721.3    3. Mild intermittent asthma without complication H47.42 595.63    4. Hypertension, benign I10 401.1    5. GERD without esophagitis K21.9 530.81         Blood pressure is well managed as is her OSA and GERD.  I will plan to see her back in 3 months unless she needs to be seen sooner post-operatively.     Follow-up Disposition:  Return in about 3 months (around 04/13/2017) for HTN, OSA.  Future Appointments  Date Time Provider [place]   04/25/2017 7:30 AM Lance Morin, MD [facility]       Lance Morin, MD  01/11/2017

## 2017-01-21 MED ORDER — RANITIDINE 300 MG TAB
300 mg | ORAL_TABLET | ORAL | 3 refills | Status: DC
Start: 2017-01-21 — End: 2018-01-20

## 2017-02-07 MED ORDER — MELOXICAM 15 MG TAB
15 mg | ORAL_TABLET | ORAL | 3 refills | Status: DC
Start: 2017-02-07 — End: 2017-08-13

## 2017-02-07 MED ORDER — LISINOPRIL 20 MG TAB
20 mg | ORAL_TABLET | ORAL | 3 refills | Status: DC
Start: 2017-02-07 — End: 2017-02-18

## 2017-02-14 NOTE — Telephone Encounter (Signed)
Have her double her lisinopril  (the stand alone pill) and follow her BP for the rest of the week and call on Friday.

## 2017-02-14 NOTE — Telephone Encounter (Signed)
Pt called again she would like to hear from Abanda asap!

## 2017-02-14 NOTE — Telephone Encounter (Signed)
The pt called and left a MV that she was in the er over the weekend with her BP. She said it was high. When she left there is was 187/something. She said today it is 157/81 which is high for her. They told her to call her PCP office on Monday. Does she need to be seen ? Or any suggestions for the pt.

## 2017-02-14 NOTE — Telephone Encounter (Signed)
Bryan- please advise.

## 2017-02-14 NOTE — Telephone Encounter (Signed)
Pt is aware, she will do this and get back to [place]

## 2017-02-14 NOTE — Telephone Encounter (Signed)
Please advise if you need to see her.

## 2017-02-18 MED ORDER — LISINOPRIL 40 MG TAB
40 mg | ORAL_TABLET | Freq: Every day | ORAL | 3 refills | Status: DC
Start: 2017-02-18 — End: 2017-04-25

## 2017-02-18 NOTE — Telephone Encounter (Signed)
Double B/P and it worked.  Should she continue to take double or go back to the one pill qhs?  Please advise.  Also had surgery on her back and they gave her methylpredisone and pt  Would like it noted in her chart that she has a reaction to that as well now.  [phone]

## 2017-02-18 NOTE — Telephone Encounter (Signed)
Pt has been notified through personalized mailbox.

## 2017-02-18 NOTE — Telephone Encounter (Signed)
She should remain on the higher dose.

## 2017-04-25 ENCOUNTER — Encounter

## 2017-04-25 ENCOUNTER — Ambulatory Visit: Admit: 2017-04-25 | Discharge: 2017-04-25 | Payer: MEDICARE | Attending: Family Medicine | Primary: Internal Medicine

## 2017-04-25 DIAGNOSIS — I10 Essential (primary) hypertension: Secondary | ICD-10-CM

## 2017-04-25 MED ORDER — HYDROCHLOROTHIAZIDE 25 MG TAB
25 mg | ORAL_TABLET | Freq: Every day | ORAL | 1 refills | Status: DC
Start: 2017-04-25 — End: 2017-10-23

## 2017-04-25 MED ORDER — LISINOPRIL 40 MG TAB
40 mg | ORAL_TABLET | Freq: Every day | ORAL | 3 refills | Status: DC
Start: 2017-04-25 — End: 2018-05-22

## 2017-04-25 NOTE — Progress Notes (Signed)
[place]   [place], [place]  [place] [address]  [phone]      Rhonda [name] is a 71 y.o. female who presents to clinic for Hypertension (follow up); Asthma (hasnt had a spirometer test, not using inhalers correctly? ); and Sleep Problem (OSA follow up).    HPI   Rhonda [name] comes to the office today for a routine follow up appointment on her multiple medical issues.  She  Is still having issues with blood pressure despite increase of lisinopril to 71m in addition to Lisinopril-HCTZ 20/25.  There have been no symptoms of elevated blood pressure such as headaches, palpitations, chest pain, epistaxis, or vision changes; nor have there been symptoms of orthostasis or dizziness that would indicate hypotension.  Medications are taken regularly and are well tolerated without side effects.  She is also still having difficulty with dyspnea had been using albuterol twice daily and this was discovered in the hospital.  She has apparently been doing this for many years, but apparently her use didn't rise to the level of concern with her insurance or the pharmacy and she fell within the normal number of refills given annually for asthma with occasional exacerbations.  She would like to know if she can decrease her albuterol use where she has been doing this for so long.  She has been using her CPAP regularly and feels that it works well in treating her OSA.  However, she wakes up in the morning every morning with a red spot on her left cheek.  This is irritated and painful but will resolve in time.  She typically covers it with makeup so she can go about her day without giving time to resolve. She also feels that her GERD remains well controlled.   HPI      Patient Active Problem List    Diagnosis Date Noted   ??? Severe obesity (BMI 35.0-39.9) ([place] 04/25/2017   ??? GERD without esophagitis 01/11/2017   ??? Hypertension, benign 01/06/2017   ??? Osteoarthritis 01/06/2017    ??? Lumbar spondylosis 10/22/2016   ??? Basal cell carcinoma of skin of right upper limb, including shoulder 07/06/2016   ??? Other screening mammogram 06/15/2016   ??? Skin lesion 06/15/2016   ??? Obstructive sleep apnea syndrome 06/15/2016   ??? Rotator cuff tear, left 10/21/2015   ??? Encounter for general adult medical examination without abnormal findings 12/06/2014   ??? Seborrheic keratosis 07/08/2014   ??? Moderate persistent asthma 11/08/1958     Current Outpatient Prescriptions   Medication Sig Dispense Refill   ??? hydroCHLOROthiazide (HYDRODIURIL) 25 mg tablet Take 1 Tab by mouth daily. 90 Tab 1   ??? lisinopril (PRINIVIL, ZESTRIL) 40 mg tablet Take 2 Tabs by mouth daily. 180 Tab 3   ??? meloxicam (MOBIC) 15 mg tablet take 1/2 tablet by mouth twice a day as directed 45 Tab 3   ??? raNITIdine (ZANTAC) 300 mg tablet  TAKE 1 TABLET BY MOUTH TWICE DAILY IF NEEDED FOR GERD 180 Tab 3   ??? fluticasone-salmeterol (ADVAIR DISKUS) 250-50 mcg/dose diskus inhaler Inhale 1 dose twice a day     ??? cpap machine kit Autoset CPAP 6-10cmH2O.     ??? albuterol (PROVENTIL HFA) 90 mcg/actuation inhaler Inhale 2 puffs four times a day if needed     ??? albuterol-ipratropium (DUO-NEB) 2.5 mg-0.5 mg/3 ml nebu Use every 4 hours as needed for dyspena  Allergies   Allergen Reactions   ??? Cefaclor Not Reported This Time   ??? Methylprednisolone Other (comments)     Pt states that she has a reaction    ??? Prednisone Not Reported This Time     Past Medical History:   Diagnosis Date   ??? Asthma    ??? HTN (hypertension)    ??? Hx of colonoscopy 2005     Past Surgical History:   Procedure Laterality Date   ??? HX CATARACT REMOVAL Left    ??? HX ROTATOR CUFF REPAIR  2007    Dr. Mickie Bail - [place]   ??? HX TONSILLECTOMY  1952     Family History   Problem Relation Age of Onset   ??? No Known Problems Son    ??? No Known Problems Daughter      Social History   Substance Use Topics   ??? Smoking status: Never Smoker   ??? Smokeless tobacco: Never Used   ??? Alcohol use Yes       Comment: Rare social ETOH          REVIEW OF SYSTEMS   Review of Systems   Constitutional: Negative for chills, diaphoresis, fever and malaise/fatigue.   HENT: Negative for hearing loss.    Eyes: Negative for blurred vision and double vision.   Respiratory: Negative for cough.    Cardiovascular: Negative for chest pain, palpitations and leg swelling.   Gastrointestinal: Negative for abdominal pain, constipation, diarrhea, nausea and vomiting.   Genitourinary: Negative for dysuria, frequency and urgency.   Skin: Negative for itching and rash.   Neurological: Negative for dizziness and headaches.         PHYSICAL EXAM     Vitals:    04/25/17 0733   BP: 138/78   Pulse: 72   Weight: 225 lb (102.1 kg)   Height: '5\' 5"'  (1.651 m)       Physical Exam   Constitutional: She is well-developed, well-nourished, and in no distress.   HENT:   Head: Normocephalic and atraumatic.   Eyes: Conjunctivae and EOM are normal. Pupils are equal, round, and reactive to light.   Cardiovascular: Normal rate, regular rhythm, S1 normal, S2 normal and normal heart sounds.  Exam reveals no gallop and no friction rub.    No murmur heard.  Pulmonary/Chest: Breath sounds normal. She has no wheezes. She has no rales.   Abdominal: Soft. Bowel sounds are normal. She exhibits no distension. There is no tenderness.   Psychiatric: Affect normal.       Assessment and Plan       ICD-10-CM ICD-9-CM    1. Hypertension, benign I10 401.1 hydroCHLOROthiazide (HYDRODIURIL) 25 mg tablet      lisinopril (PRINIVIL, ZESTRIL) 40 mg tablet      METABOLIC PANEL, COMPREHENSIVE      LIPID PANEL W/ REFLX DIRECT LDL      CBC WITH AUTOMATED DIFF   2. Obstructive sleep apnea syndrome G47.33 327.23    3. Moderate persistent asthma without complication U27.25 366.44 PFT COMPLETE      CBC WITH AUTOMATED DIFF   4. GERD without esophagitis K21.9 530.81         We will maximize her ACE-I and in doing so will discontinue the combo.   She will instead be prescribed HCTZ alone and take 26m of lisinopril.  I will plan to see her back in 3 months for a follow up provided she continues to check home blood pressures (as her husband  does this with a manual cuff) and these are well controlled.  She will have her labs updated.  We discussed that she should decrease her albuterol use and she will start by eliminating the AM dose.  She will have an updated PFT to determine her current lung function and see if it has changed from a normal asthma picture to more of a COPD type picture.  This will guide further medication management.    She will try to pad the CPAP tubing with something and if this is not effective will talk to the respiratory supplier to see what options are available to help manage this.   She will remain on her H2RA for her GERD as this is stable.     Follow-up Disposition:  Return in about 3 months (around 07/26/2017).  Future Appointments  Date Time Provider [place]   08/23/2017 7:30 AM Lance Morin, MD [facility]       Lance Morin, MD  04/25/2017

## 2017-05-13 LAB — CBC WITH AUTOMATED DIFF
HCT: 39.6 % (ref 36.0–47.0)
HGB: 13.5 g/dL (ref 12.0–16.0)
MCH: 32.1 pg (ref 28.0–34.0)
MCHC: 34.1 g/dL (ref 32.0–36.0)
MCV: 94.1 fL (ref 80.0–100.0)
MEAN PLATELET VOLUME: 9.6 fL (ref 8.5–12.0)
PLATELET: 338 10*3/uL (ref 150–400)
RBC: 4.21 Mil/uL (ref 4.20–5.40)
RDW-CV: 13 % (ref 11.5–13.5)
RDW-SD: 44.2 fL (ref 35.0–47.0)
WBC: 8.3 10*3/uL (ref 4.8–10.8)

## 2017-05-13 LAB — METABOLIC PANEL, COMPREHENSIVE
ALT (SGPT): 32 IU/L (ref 10–40)
AST (SGOT): 35 IU/L (ref 10–42)
Albumin: 4.1 g/dL (ref 3.5–5.0)
Alk. phosphatase: 61 IU/L (ref 28–100)
Anion gap: 8 mEq/L (ref 3–11)
BUN: 20 mg/dL — ABNORMAL HIGH (ref 7–18)
Bilirubin, total: 0.6 mg/dL (ref 0.2–1.0)
CO2: 27 mmol/L (ref 21–31)
Calcium: 9 mg/dL (ref 8.4–10.2)
Chloride: 103 mmol/L (ref 101–111)
Creatinine: 0.98 mg/dL (ref 0.50–1.06)
Glucose: 102 mg/dL (ref 70–120)
Potassium: 4 mmol/L (ref 3.5–5.0)
Protein, total: 7.3 g/dL (ref 5.9–7.8)
Sodium: 138 mmol/L (ref 135–145)
eGFR: 56

## 2017-05-13 LAB — DIFFERENTIAL, AUTO.
ABS. BASOPHILS: 0.07 10*3/uL (ref 0.00–0.20)
ABS. BASOPHILS: 0.07 10*3/uL (ref 0.00–0.20)
ABS. IMM. GRANS.: 0.04 10*3/uL (ref 0.00–0.06)
ABS. IMM. GRANS.: 0.04 10*3/uL (ref 0.00–0.06)
ABS. MONOCYTES: 0.56 10*3/uL (ref 0.10–0.80)
ABS. MONOCYTES: 0.56 10*3/uL (ref 0.10–0.80)
ABS. NEUTROPHILS: 5.16 10*3/uL (ref 1.90–7.80)
ABS. NEUTROPHILS: 5.16 10*3/uL (ref 1.90–7.80)
Abs Lymphocytes: 2.37 10*3/uL (ref 1.00–4.50)
Abs Lymphocytes: 2.37 10*3/uL (ref 1.00–4.50)
BASOPHILS: 0.8 % (ref 0.0–2.0)
BASOPHILS: 0.8 % (ref 0.0–2.0)
BRCH EOSINS: 1.4 % (ref 0.0–5.0)
BRCH EOSINS: 1.4 % (ref 0.0–5.0)
BRCH NEUTROPHIL: 62.1 % (ref 45.0–85.0)
BRCH NEUTROPHIL: 62.1 % (ref 45.0–85.0)
Eos abs-DIF: 0.12 10*3/uL (ref 0.00–0.50)
Eos abs-DIF: 0.12 10*3/uL (ref 0.00–0.50)
IMMATURE GRANULOCYTES: 0.5 %
IMMATURE GRANULOCYTES: 0.5 %
LYMPHOCYTES: 28.5 % (ref 25.0–45.0)
LYMPHOCYTES: 28.5 % (ref 25.0–45.0)
MONOCYTES: 6.7 % (ref 1.0–9.0)
MONOCYTES: 6.7 % (ref 1.0–9.0)

## 2017-05-13 LAB — LIPID PANEL W/ REFLX DIRECT LDL
Cholesterol, total: 182 mg/dL (ref 165–199)
HDL Cholesterol: 36 mg/dL — ABNORMAL LOW (ref 40–91)
LDL CHOL, CALCULATED: 118 mg/dL (ref 70–129)
LDL+VLDL: 146 mg/dL (ref <=160)
Triglyceride: 142 mg/dL (ref 50–149)

## 2017-05-16 NOTE — Progress Notes (Signed)
Please let her know that her labs are excellent.

## 2017-05-17 NOTE — Progress Notes (Signed)
Pt aware

## 2017-05-18 ENCOUNTER — Inpatient Hospital Stay: Admit: 2017-05-18 | Discharge: 2017-05-20 | Payer: MEDICARE | Attending: Family Medicine | Primary: Internal Medicine

## 2017-05-18 DIAGNOSIS — J45909 Unspecified asthma, uncomplicated: Secondary | ICD-10-CM

## 2017-05-18 NOTE — Procedures (Signed)
PULMONARY FUNCTION TEST    PATIENT'S NAME:        Rhonda [name], Rhonda [name]  DATE OF BIRTH:         29-Jan-1946  MR#:                   [id number]  ACCT#:                 [id number]  TEST TYPE              Pulmonary Function Test  TEST DATE:             05/18/2017  ORDERING PHYSICIAN     Beverly GustAndrew C Kingsly Kloepfer, MD  PCP:                   Ellin GoodieBRYAN [name] LUNDQUIST    Spirometry demonstrates a normal forced vital capacity of 104% and a normal FEV1 of 102%.  The FEV1/FVC ratio is normal at 75%.  Bronchodilator response is not assessed.     Total lung capacity is normal at 104% and residual volume normal at 98%.  Diffusion capacity is normal at 95%.    CONCLUSION:  This is a normal study.  If a diagnosis of asthma remains under question, then a methacholine challenge test could be considered if clinically indicated.          ______________________________  Beverly GustAndrew C Domini Vandehei, MD      D:  05/18/2017 10:06:57  T:  05/18/2017 10:13:19    [id number]  ACD/MODL      CC:         Johnn HaiBryan Lundquist, MD

## 2017-05-18 NOTE — Procedures (Signed)
PULMONARY FUNCTION TEST    PATIENT'S NAME:        Rhonda [name], Rhonda [name]  DATE OF BIRTH:         11/05/1946  MR#:                   [id number]  ACCT#:                 [id number]  TEST TYPE              Pulmonary Function Test  TEST DATE:             05/18/2017  ORDERING PHYSICIAN     Lucus Lambertson C Marnie Fazzino, MD  PCP:                   BRYAN [name] LUNDQUIST    Spirometry demonstrates a normal forced vital capacity of 104% and a normal FEV1 of 102%.  The FEV1/FVC ratio is normal at 75%.  Bronchodilator response is not assessed.     Total lung capacity is normal at 104% and residual volume normal at 98%.  Diffusion capacity is normal at 95%.    CONCLUSION:  This is a normal study.  If a diagnosis of asthma remains under question, then a methacholine challenge test could be considered if clinically indicated.          ______________________________  Ginni Eichler C Alanmichael Barmore, MD      D:  05/18/2017 10:06:57  T:  05/18/2017 10:13:19    [id number]  ACD/MODL      CC:         Bryan Lundquist, MD

## 2017-08-15 MED ORDER — MELOXICAM 15 MG TAB
15 mg | ORAL_TABLET | ORAL | 3 refills | Status: DC
Start: 2017-08-15 — End: 2018-02-02

## 2017-08-15 NOTE — Telephone Encounter (Signed)
Please address

## 2017-08-15 NOTE — Telephone Encounter (Signed)
Lab Results   Component Value Date/Time    Sodium 138 05/13/2017 07:40 AM    Potassium 4.0 05/13/2017 07:40 AM    Chloride 103 05/13/2017 07:40 AM    CO2 27 05/13/2017 07:40 AM    Anion gap 8 05/13/2017 07:40 AM    Glucose 102 05/13/2017 07:40 AM    BUN 20 (H) 05/13/2017 07:40 AM    Creatinine 0.98 05/13/2017 07:40 AM    Calcium 9.0 05/13/2017 07:40 AM    Bilirubin, total 0.6 05/13/2017 07:40 AM    AST (SGOT) 35 05/13/2017 07:40 AM    Alk. phosphatase 61 05/13/2017 07:40 AM    Protein, total 7.3 05/13/2017 07:40 AM    Albumin 4.1 05/13/2017 07:40 AM    ALT (SGPT) 32 05/13/2017 07:40 AM     Future Appointments  Date Time Provider [place]   08/23/2017 7:30 AM Lance Morin, MD [facility]

## 2017-08-17 MED ORDER — ALBUTEROL SULFATE HFA 90 MCG/ACTUATION AEROSOL INHALER
90 mcg/actuation | Freq: Four times a day (QID) | RESPIRATORY_TRACT | 2 refills | Status: DC | PRN
Start: 2017-08-17 — End: 2017-09-02

## 2017-08-17 NOTE — Telephone Encounter (Signed)
-----   Message from Cristal Generous, [place] sent at 08/17/2017  7:06 AM EDT -----  Regarding: FW: Prescription Question  Contact: [phone]      ----- Message -----     From: Annye Asa     Sent: 08/16/2017   6:52 PM       To: [facility]  Subject: Prescription Question                            I need a refill on my Ventolin HFA.  I thought there was a refill for Oct.  but missed the refill date by one day. Probably ok for the next few days, but will need it by the weekend.  Thanks!  Rhonda [name]

## 2017-08-23 ENCOUNTER — Ambulatory Visit: Admit: 2017-08-23 | Discharge: 2017-08-23 | Payer: MEDICARE | Attending: Family Medicine | Primary: Internal Medicine

## 2017-08-23 DIAGNOSIS — I10 Essential (primary) hypertension: Secondary | ICD-10-CM

## 2017-08-23 MED ORDER — VARICELLA-ZOSTER GLYCOE VACC-AS01B ADJ(PF) 50 MCG/0.5 ML IM SUSPENSION
50 mcg/0.5 mL | INTRAMUSCULAR | 1 refills | Status: DC
Start: 2017-08-23 — End: 2018-11-15

## 2017-08-23 MED ORDER — IPRATROPIUM-ALBUTEROL 2.5 MG-0.5 MG/3 ML NEB SOLUTION
2.5 mg-0.5 mg/3 ml | INHALATION_SOLUTION | Freq: Four times a day (QID) | RESPIRATORY_TRACT | 2 refills | Status: AC | PRN
Start: 2017-08-23 — End: ?

## 2017-08-23 NOTE — Progress Notes (Signed)
[facility]   [address], [place]  [place] [address]  [phone]      Rhonda [name] is a 71 y.o. female who presents to clinic for Hypertension (follow up).    HPI   [name] comes to the office today for a routine follow up appointment.  She feels that she is doing well and her asthma and allergies don't seem to be bothering her much this fall.  She has been using her CPAP regularly and it has been beneficial for her.  She is awakening feeling rested in the AM and is not drowsy during the day.  She has not had any issues with elevated blood pressure at home and does check on a regular basis. There have been no symptoms of elevated blood pressure such as headaches, palpitations, chest pain, epistaxis, or vision changes; nor have there been symptoms of orthostasis or dizziness that would indicate hypotension.  Medications are taken regularly and are well tolerated without side effects.  She has not had any issues with her GERD.         Patient Active Problem List    Diagnosis Date Noted   ??? Severe obesity (BMI 35.0-39.9) 04/25/2017   ??? GERD without esophagitis 01/11/2017   ??? Hypertension, benign 01/06/2017   ??? Osteoarthritis 01/06/2017   ??? Lumbar spondylosis 10/22/2016   ??? Basal cell carcinoma of skin of right upper limb, including shoulder 07/06/2016   ??? Other screening mammogram 06/15/2016   ??? Skin lesion 06/15/2016   ??? Obstructive sleep apnea syndrome 06/15/2016   ??? Rotator cuff tear, left 10/21/2015   ??? Encounter for general adult medical examination without abnormal findings 12/06/2014   ??? Seborrheic keratosis 07/08/2014   ??? Moderate persistent asthma 11/08/1958     Current Outpatient Prescriptions   Medication Sig Dispense Refill   ??? albuterol-ipratropium (DUO-NEB) 2.5 mg-0.5 mg/3 ml nebu 3 mL by Nebulization route every six (6) hours as needed. Use every 4 hours as needed for dyspena 30 Nebule 2    ??? varicella-zoster recombinant, PF, (SHINGRIX, PF,) 50 mcg/0.5 mL susr injection 0.5 ml IM, 1 dose now, repeat dose in 2-6 months for  Indications: PREVENTION OF HERPES ZOSTER 0.5 mL 1   ??? albuterol (PROVENTIL HFA) 90 mcg/actuation inhaler Take 2 Puffs by inhalation every six (6) hours as needed for Wheezing. Inhale 2 puffs four times a day if needed 1 Inhaler 2   ??? meloxicam (MOBIC) 15 mg tablet  TAKE 1/2 TABLET BY MOUTH TWICE DAILY AS DIRECTED 45 Tab 3   ??? hydroCHLOROthiazide (HYDRODIURIL) 25 mg tablet Take 1 Tab by mouth daily. 90 Tab 1   ??? lisinopril (PRINIVIL, ZESTRIL) 40 mg tablet Take 2 Tabs by mouth daily. 180 Tab 3   ??? raNITIdine (ZANTAC) 300 mg tablet  TAKE 1 TABLET BY MOUTH TWICE DAILY IF NEEDED FOR GERD 180 Tab 3   ??? fluticasone-salmeterol (ADVAIR DISKUS) 250-50 mcg/dose diskus inhaler Inhale 1 dose twice a day     ??? cpap machine kit Autoset CPAP 6-10cmH2O.       Allergies   Allergen Reactions   ??? Cefaclor Not Reported This Time   ??? Methylprednisolone Other (comments)     Pt states that she has a reaction    ??? Prednisone Not Reported This Time     Past Medical History:   Diagnosis Date   ??? Asthma    ??? HTN (hypertension)    ???  Hx of colonoscopy 2005     Past Surgical History:   Procedure Laterality Date   ??? HX CATARACT REMOVAL Left    ??? HX ROTATOR CUFF REPAIR  2007    Dr. Mickie Bail - [place]   ??? HX TONSILLECTOMY  1952     Family History   Problem Relation Age of Onset   ??? No Known Problems Son    ??? No Known Problems Daughter      Social History   Substance Use Topics   ??? Smoking status: Never Smoker   ??? Smokeless tobacco: Never Used   ??? Alcohol use Yes      Comment: Rare social ETOH      Lab Results   Component Value Date/Time    WBC 8.3 05/13/2017 07:40 AM    HGB 13.5 05/13/2017 07:40 AM    HCT 39.6 05/13/2017 07:40 AM    PLATELET 338 05/13/2017 07:40 AM    MCV 94.1 05/13/2017 07:40 AM     Lab Results   Component Value Date/Time    Cholesterol, total 182 05/13/2017 07:40 AM     HDL Cholesterol 36 (L) 05/13/2017 07:40 AM    LDL CHOL, CALCULATED 118 05/13/2017 07:40 AM    Triglyceride 142 05/13/2017 07:40 AM     Lab Results   Component Value Date/Time    ALT (SGPT) 32 05/13/2017 07:40 AM    AST (SGOT) 35 05/13/2017 07:40 AM    Alk. phosphatase 61 05/13/2017 07:40 AM    Bilirubin, total 0.6 05/13/2017 07:40 AM    Albumin 4.1 05/13/2017 07:40 AM    Protein, total 7.3 05/13/2017 07:40 AM    PLATELET 338 05/13/2017 07:40 AM       Lab Results   Component Value Date/Time    Creatinine 0.98 05/13/2017 07:40 AM    BUN 20 (H) 05/13/2017 07:40 AM    Sodium 138 05/13/2017 07:40 AM    Potassium 4.0 05/13/2017 07:40 AM    Chloride 103 05/13/2017 07:40 AM    CO2 27 05/13/2017 07:40 AM       Lab Results   Component Value Date/Time    Glucose 102 05/13/2017 07:40 AM        REVIEW OF SYSTEMS   Review of Systems   Constitutional: Negative for chills, diaphoresis, fever and malaise/fatigue.   HENT: Negative for hearing loss.    Eyes: Negative for blurred vision and double vision.   Respiratory: Negative for cough, shortness of breath and wheezing.    Cardiovascular: Negative for chest pain, palpitations and leg swelling.   Gastrointestinal: Negative for abdominal pain, constipation, diarrhea, nausea and vomiting.   Genitourinary: Negative for dysuria, frequency and urgency.   Skin: Negative for itching and rash.   Neurological: Negative for dizziness and headaches.         PHYSICAL EXAM     Vitals:    08/23/17 0732   BP: 138/82   Pulse: 74   Weight: 228 lb (103.4 kg)   Height: '5\' 5"'  (1.651 m)       Physical Exam   Constitutional: She is well-developed, well-nourished, and in no distress.   HENT:   Head: Normocephalic and atraumatic.   Eyes: Conjunctivae and EOM are normal. Pupils are equal, round, and reactive to light.   Cardiovascular: Normal rate, regular rhythm, S1 normal, S2 normal and normal heart sounds.  Exam reveals no gallop and no friction rub.    No murmur heard.   Pulmonary/Chest: Breath sounds normal. She has no  wheezes. She has no rales.   Abdominal: Soft. Bowel sounds are normal. She exhibits no distension. There is no tenderness.   Psychiatric: Affect normal.     Assessment and Plan       ICD-10-CM ICD-9-CM    1. Hypertension, benign I10 401.1    2. GERD without esophagitis K21.9 530.81    3. Moderate persistent asthma without complication X21.19 417.40 albuterol-ipratropium (DUO-NEB) 2.5 mg-0.5 mg/3 ml nebu   4. Obstructive sleep apnea syndrome G47.33 327.23    5. Encounter for general adult medical examination without abnormal findings Z00.00 V70.9 varicella-zoster recombinant, PF, (SHINGRIX, PF,) 50 mcg/0.5 mL susr injection      HEPATITIS C AB       She is doing well overall.  We reviewed her recent normal PFT study.  She still finds benefit to her dyspnea with her inhalers so these will be continued at this time as she may have an exercise component or some other trigger.  She will have her Hep C screening test done when she can get to the lab.      Follow-up Disposition:  Return in about 3 months (around 11/23/2017) for Multiple issues.  Future Appointments  Date Time Provider [place]   12/01/2017 7:30 AM Lance Morin, MD [facility]       Lance Morin, MD  08/23/2017

## 2017-09-05 MED ORDER — ALBUTEROL SULFATE HFA 90 MCG/ACTUATION AEROSOL INHALER
90 mcg/actuation | Freq: Four times a day (QID) | RESPIRATORY_TRACT | 2 refills | Status: DC | PRN
Start: 2017-09-05 — End: 2020-08-20

## 2017-09-05 NOTE — Telephone Encounter (Signed)
Looks like it has been being filled with 1, because that is what the pharmacy was requesting erequest. I have changed this.

## 2017-09-27 DIAGNOSIS — Z8711 Personal history of peptic ulcer disease: Secondary | ICD-10-CM

## 2017-09-27 DIAGNOSIS — E785 Hyperlipidemia, unspecified: Secondary | ICD-10-CM

## 2017-10-03 NOTE — Progress Notes (Signed)
Your Hepatitis C test was negative. If you have any questions, please feel free to contact the office.

## 2017-10-04 LAB — HEPATITIS C ANTIBODY: HEPATITIS C AB,HCAB: NEGATIVE

## 2017-10-04 LAB — HEPATITIS C AB: Hepatitis C Ab: NEGATIVE

## 2017-10-07 NOTE — Telephone Encounter (Signed)
I spoke with Dr Juliann PulseLundquist and he said the pt has to be off the Azo for a full 24 hours before it can be tested, I called the pt and gave her this information and suggested a walk in care or Er over the weekend if things do not improve .,

## 2017-10-07 NOTE — Telephone Encounter (Signed)
The pt called and she has frequent urination and burning, she said she feels like she has a shiver in her thighs when she goes.  she has done the azo for 2 days and this has not helped can a rx be sent in the pt uses [facility] in [place] if a rx is sent please advise and call the pt

## 2017-10-07 NOTE — Telephone Encounter (Signed)
[facility]

## 2017-10-23 ENCOUNTER — Encounter

## 2017-10-24 MED ORDER — HYDROCHLOROTHIAZIDE 25 MG TAB
25 mg | ORAL_TABLET | ORAL | 1 refills | Status: DC
Start: 2017-10-24 — End: 2018-04-24

## 2017-10-24 NOTE — Telephone Encounter (Signed)
Lab Results   Component Value Date/Time    Sodium 138 05/13/2017 07:40 AM    Potassium 4.0 05/13/2017 07:40 AM    Chloride 103 05/13/2017 07:40 AM    CO2 27 05/13/2017 07:40 AM    Anion gap 8 05/13/2017 07:40 AM    Glucose 102 05/13/2017 07:40 AM    BUN 20 (H) 05/13/2017 07:40 AM    Creatinine 0.98 05/13/2017 07:40 AM    Calcium 9.0 05/13/2017 07:40 AM    Bilirubin, total 0.6 05/13/2017 07:40 AM    AST (SGOT) 35 05/13/2017 07:40 AM    Alk. phosphatase 61 05/13/2017 07:40 AM    Protein, total 7.3 05/13/2017 07:40 AM    Albumin 4.1 05/13/2017 07:40 AM    ALT (SGPT) 32 05/13/2017 07:40 AM     Future Appointments   Date Time Provider [place]   12/01/2017  7:30 AM Lundquist, Ike Bene, MD [facility]

## 2017-11-14 MED ORDER — ADVAIR DISKUS 250 MCG-50 MCG/DOSE POWDER FOR INHALATION
250-50 mcg/dose | RESPIRATORY_TRACT | 3 refills | Status: DC
Start: 2017-11-14 — End: 2018-10-23

## 2017-12-01 ENCOUNTER — Ambulatory Visit: Admit: 2017-12-01 | Discharge: 2017-12-01 | Payer: MEDICARE | Attending: Family Medicine | Primary: Internal Medicine

## 2017-12-01 DIAGNOSIS — I10 Essential (primary) hypertension: Secondary | ICD-10-CM

## 2017-12-01 NOTE — Progress Notes (Signed)
[facility]   [address], [place]  [place] [address]  [phone]      Rhonda [name] is a 72 y.o. female who presents to clinic for Hypertension (follow up) and Medication Evaluation (mobic? long term).    HPI   [place] comes to the office for a follow up appointment on her blood pressure and to discuss meloxicam as she was at her husband's rheumatology appointment when he was told this medication should be discontinued due to side effects. She feels that her hips might be getting worse as they are giving her more discomfort.  However, she would prefer to wait until her husband's issues are addressed before delving into this for herself.  She feels that the pain is an annoyance right now and not a limitation.  She feels her blood pressure has remained under good control and has been in the normal range when checked at home.       Patient Active Problem List    Diagnosis Date Noted   ??? Severe obesity (BMI 35.0-39.9) 04/25/2017   ??? GERD without esophagitis 01/11/2017   ??? Hypertension, benign 01/06/2017   ??? Osteoarthritis 01/06/2017   ??? Lumbar spondylosis 10/22/2016   ??? Basal cell carcinoma of skin of right upper limb, including shoulder 07/06/2016   ??? Other screening mammogram 06/15/2016   ??? Skin lesion 06/15/2016   ??? Obstructive sleep apnea syndrome 06/15/2016   ??? Rotator cuff tear, left 10/21/2015   ??? Encounter for general adult medical examination without abnormal findings 12/06/2014   ??? Seborrheic keratosis 07/08/2014   ??? Moderate persistent asthma 11/08/1958     Current Outpatient Medications   Medication Sig Dispense Refill   ??? ADVAIR DISKUS 250-50 mcg/dose diskus inhaler inhale 1 dose by mouth twice a day 3 Inhaler 3   ??? hydroCHLOROthiazide (HYDRODIURIL) 25 mg tablet take 1 tablet by mouth once daily 90 Tab 1   ??? albuterol (PROVENTIL HFA) 90 mcg/actuation inhaler Take 2 Puffs by inhalation every six (6) hours as needed for Wheezing. Inhale 2 puffs four  times a day if needed 3 Inhaler 2   ??? albuterol-ipratropium (DUO-NEB) 2.5 mg-0.5 mg/3 ml nebu 3 mL by Nebulization route every six (6) hours as needed. Use every 4 hours as needed for dyspena 30 Nebule 2   ??? varicella-zoster recombinant, PF, (SHINGRIX, PF,) 50 mcg/0.5 mL susr injection 0.5 ml IM, 1 dose now, repeat dose in 2-6 months for  Indications: PREVENTION OF HERPES ZOSTER 0.5 mL 1   ??? meloxicam (MOBIC) 15 mg tablet  TAKE 1/2 TABLET BY MOUTH TWICE DAILY AS DIRECTED 45 Tab 3   ??? lisinopril (PRINIVIL, ZESTRIL) 40 mg tablet Take 2 Tabs by mouth daily. 180 Tab 3   ??? raNITIdine (ZANTAC) 300 mg tablet  TAKE 1 TABLET BY MOUTH TWICE DAILY IF NEEDED FOR GERD 180 Tab 3   ??? cpap machine kit Autoset CPAP 6-10cmH2O.       Allergies   Allergen Reactions   ??? Cefaclor Not Reported This Time   ??? Methylprednisolone Other (comments)     Pt states that she has a reaction    ??? Prednisone Not Reported This Time     Past Medical History:   Diagnosis Date   ??? Asthma    ??? HTN (hypertension)    ??? Hx of colonoscopy 2005     Past Surgical History:   Procedure Laterality Date   ??? HX  CATARACT REMOVAL Left    ??? HX ROTATOR CUFF REPAIR  2007    Dr. Mickie Bail - [place]   ??? HX TONSILLECTOMY  1952     Family History   Problem Relation Age of Onset   ??? No Known Problems Son    ??? No Known Problems Daughter      Social History     Tobacco Use   ??? Smoking status: Never Smoker   ??? Smokeless tobacco: Never Used   Substance Use Topics   ??? Alcohol use: Yes     Comment: Rare social ETOH      Lab Results   Component Value Date/Time    WBC 8.3 05/13/2017 07:40 AM    HGB 13.5 05/13/2017 07:40 AM    HCT 39.6 05/13/2017 07:40 AM    PLATELET 338 05/13/2017 07:40 AM    MCV 94.1 05/13/2017 07:40 AM     Lab Results   Component Value Date/Time    Cholesterol, total 182 05/13/2017 07:40 AM    HDL Cholesterol 36 (L) 05/13/2017 07:40 AM    LDL CHOL, CALCULATED 118 05/13/2017 07:40 AM    Triglyceride 142 05/13/2017 07:40 AM     Lab Results   Component Value Date/Time     ALT (SGPT) 32 05/13/2017 07:40 AM    AST (SGOT) 35 05/13/2017 07:40 AM    Alk. phosphatase 61 05/13/2017 07:40 AM    Bilirubin, total 0.6 05/13/2017 07:40 AM    Albumin 4.1 05/13/2017 07:40 AM    Protein, total 7.3 05/13/2017 07:40 AM    PLATELET 338 05/13/2017 07:40 AM    Hepatitis C Ab NEG 10/03/2017 01:34 PM     Lab Results   Component Value Date/Time    Creatinine 0.98 05/13/2017 07:40 AM    BUN 20 (H) 05/13/2017 07:40 AM    Sodium 138 05/13/2017 07:40 AM    Potassium 4.0 05/13/2017 07:40 AM    Chloride 103 05/13/2017 07:40 AM    CO2 27 05/13/2017 07:40 AM     Lab Results   Component Value Date/Time    Glucose 102 05/13/2017 07:40 AM        REVIEW OF SYSTEMS   ROS      PHYSICAL EXAM     Vitals:    12/01/17 0803   BP: 140/72   Pulse: 74   Weight: 228 lb (103.4 kg)   Height: '5\' 5"'  (1.651 m)       Physical Exam    LABS/IMAGING     No results found for any visits on 12/01/17.        Assessment and Plan       ICD-10-CM ICD-9-CM    1. Hypertension, benign I10 401.1    2. Primary osteoarthritis involving multiple joints M15.0 715.09    3. Lumbar spondylosis M47.816 721.3         We had a very long discussion today going over the difficult balance of medication management in patients with multiple medical problems.  I explained that we have to look at the risk and benefit of each medication in light of the specific patient and not generalized research data.  We discussed that the NSAIDs are a broad class of medication with varied side effect profiles amongst them all and each with their own risk of causing these side effects.  I explained how the NSAIDs work and we discussed the difference in Meloxicam and Celebrex being more selective for COX2 with Meloxicam bleeding over to hitting COX1 at  the 7m dose.  These two medications have been shown in research to be safer than traditional NSAIDs with Celebrex being placed back on the market after a temporary withdrawal due to "class concerns" many years ago while the other  medications in the same class - Bextra and Vioxx - were discontinued.  As she has been doing well on Meloxicam and Celebrex is more expensive the slight benefit of Celebrex does not push uKoreato change to Celebrex.  If we were to risk stratify all of the issues that could affect the renal and cardiovascular system Meloxicam would rank close to if not at the bottom of this list.       We will address her hip issues further when she is ready to do so.     Her blood pressure is stable at this time, we will continue to follow for now.         Follow-up Disposition:  Return in about 3 months (around 03/01/2018) for HTN, OSA, Hip OA.  Future Appointments   Date Time Provider [place]  03/06/2018  7:30 AM Daymien Goth[facility]       BLance Morin MD  12/01/2017

## 2017-12-22 ENCOUNTER — Ambulatory Visit (INDEPENDENT_AMBULATORY_CARE_PROVIDER_SITE_OTHER): Admitting: Family Medicine

## 2017-12-22 ENCOUNTER — Encounter (INDEPENDENT_AMBULATORY_CARE_PROVIDER_SITE_OTHER): Payer: Self-pay | Admitting: Family Medicine

## 2017-12-22 VITALS — BP 118/80 | HR 101 | Temp 98.4°F | Resp 16 | Ht 62.6 in | Wt 128.0 lb

## 2017-12-22 DIAGNOSIS — K219 Gastro-esophageal reflux disease without esophagitis: Secondary | ICD-10-CM

## 2017-12-22 DIAGNOSIS — J309 Allergic rhinitis, unspecified: Secondary | ICD-10-CM

## 2017-12-22 DIAGNOSIS — M25512 Pain in left shoulder: Secondary | ICD-10-CM

## 2017-12-22 DIAGNOSIS — M25561 Pain in right knee: Secondary | ICD-10-CM

## 2017-12-22 DIAGNOSIS — J302 Other seasonal allergic rhinitis: Secondary | ICD-10-CM

## 2017-12-22 DIAGNOSIS — Z853 Personal history of malignant neoplasm of breast: Principal | ICD-10-CM

## 2017-12-22 DIAGNOSIS — G8929 Other chronic pain: Secondary | ICD-10-CM

## 2017-12-22 DIAGNOSIS — Z96651 Presence of right artificial knee joint: Secondary | ICD-10-CM

## 2017-12-22 DIAGNOSIS — K589 Irritable bowel syndrome without diarrhea: Secondary | ICD-10-CM

## 2017-12-22 MED ORDER — DIPHENOXYLATE-ATROPINE 2.5-0.025 MG OR TABS
1.0000 | ORAL_TABLET | Freq: Four times a day (QID) | ORAL | 1 refills | Status: DC | PRN
Start: 2017-12-22 — End: 2019-01-11

## 2017-12-22 MED ORDER — RANITIDINE HCL 150 MG OR TABS
150.0000 mg | ORAL_TABLET | Freq: Two times a day (BID) | ORAL | 1 refills | Status: DC | PRN
Start: 2017-12-22 — End: 2018-05-10

## 2017-12-22 MED ORDER — SIMVASTATIN 20 MG OR TABS
ORAL_TABLET | ORAL | 2 refills | Status: DC
Start: 2017-09-27 — End: 2017-12-22

## 2017-12-22 MED ORDER — PANTOPRAZOLE SODIUM 40 MG OR TBEC
DELAYED_RELEASE_TABLET | ORAL | 2 refills | Status: DC
Start: 2017-09-27 — End: 2017-12-22

## 2017-12-22 MED ORDER — CIPROFLOXACIN HCL 500 MG OR TABS
ORAL_TABLET | ORAL | 0 refills | Status: DC
Start: 2017-10-28 — End: 2017-12-22

## 2017-12-30 NOTE — Telephone Encounter (Signed)
Signed and given to Jess to attache med list.

## 2017-12-30 NOTE — Telephone Encounter (Signed)
What type of paperwork?  dmv  How did it arrive in the office?patient  Is the patient portion complete?yes  What needs to be done once paperwork is completed? (IE fax, mail, etc)fax to dmv  What was done with paperwork? (IE put in providers box, etc)  Provider box

## 2017-12-30 NOTE — Telephone Encounter (Signed)
Placed for BL to sign.

## 2018-01-02 NOTE — Telephone Encounter (Signed)
faxed

## 2018-01-05 ENCOUNTER — Telehealth (INDEPENDENT_AMBULATORY_CARE_PROVIDER_SITE_OTHER): Payer: Self-pay | Admitting: Family Medicine

## 2018-01-05 ENCOUNTER — Encounter (INDEPENDENT_AMBULATORY_CARE_PROVIDER_SITE_OTHER): Payer: Self-pay | Admitting: Family Medicine

## 2018-01-05 DIAGNOSIS — E7800 Pure hypercholesterolemia, unspecified: Principal | ICD-10-CM

## 2018-01-05 LAB — COMPREHENSIVE METABOLIC PANEL, BLOOD
ALT (SGPT): 37 U/L — ABNORMAL HIGH (ref 6–29)
AST (SGOT): 27 U/L (ref 10–35)
Albumin/Glob Ratio: 1.9 (calc) (ref 1.0–2.5)
Albumin: 4.8 g/dL (ref 3.6–5.1)
Alkaline Phos: 89 U/L (ref 33–130)
BUN/Creatinine Ratio: 20 (calc) (ref 6–22)
BUN: 19 mg/dL (ref 7–25)
Bilirubin, Total: 0.6 mg/dL (ref 0.2–1.2)
Calcium: 9.8 mg/dL (ref 8.6–10.4)
Carbon Dioxide: 25 mmol/L (ref 20–32)
Chloride: 102 mmol/L (ref 98–110)
Creatinine: 0.95 mg/dL — ABNORMAL HIGH (ref 0.60–0.93)
Globulin: 2.5 g/dL (calc) (ref 1.9–3.7)
Glucose: 89 mg/dL (ref 65–99)
Potassium: 4.4 mmol/L (ref 3.5–5.3)
Sodium: 138 mmol/L (ref 135–146)
Total Protein: 7.3 g/dL (ref 6.1–8.1)
eGFR African American: 70 mL/min/{1.73_m2} (ref >=60)
eGFR non-Afr.American: 61 mL/min/{1.73_m2} (ref >=60)

## 2018-01-05 LAB — LIPID PANEL W/REFLEX TO DIRECT LDL
Chol/HDLC Ratio: 7.9 (calc) — ABNORMAL HIGH (ref <5.0)
Cholesterol: 309 mg/dL — ABNORMAL HIGH (ref <200)
HDL Cholesterol: 39 mg/dL — ABNORMAL LOW (ref >50)
Non-HDL Cholesterol: 270 mg/dL (calc) — ABNORMAL HIGH (ref <130)
Triglycerides: 500 mg/dL — ABNORMAL HIGH (ref <150)

## 2018-01-05 LAB — DIRECT LDL - QUEST HISTORICAL: Direct LDL: 178 mg/dL — ABNORMAL HIGH (ref <100)

## 2018-01-05 MED ORDER — ATORVASTATIN CALCIUM 20 MG OR TABS
20.0000 mg | ORAL_TABLET | Freq: Every day | ORAL | 3 refills | Status: DC
Start: 2018-01-05 — End: 2019-01-11

## 2018-01-11 ENCOUNTER — Encounter (INDEPENDENT_AMBULATORY_CARE_PROVIDER_SITE_OTHER): Payer: Self-pay | Admitting: Physician Assistant

## 2018-01-11 ENCOUNTER — Ambulatory Visit (INDEPENDENT_AMBULATORY_CARE_PROVIDER_SITE_OTHER): Admitting: Physician Assistant

## 2018-01-11 VITALS — BP 110/72 | HR 72 | Temp 98.8°F | Resp 16 | Ht 62.6 in | Wt 129.0 lb

## 2018-01-11 MED ORDER — CLARITHROMYCIN 500 MG OR TABS
500.0000 mg | ORAL_TABLET | Freq: Two times a day (BID) | ORAL | 0 refills | Status: DC
Start: 2018-01-11 — End: 2019-01-08

## 2018-01-11 MED ORDER — METRONIDAZOLE 500 MG OR TABS
500.0000 mg | ORAL_TABLET | Freq: Three times a day (TID) | ORAL | 0 refills | Status: DC
Start: 2018-01-11 — End: 2018-09-13

## 2018-01-11 MED ORDER — OMEPRAZOLE 40 MG OR CPDR
40.0000 mg | DELAYED_RELEASE_CAPSULE | Freq: Every day | ORAL | 0 refills | Status: DC
Start: 2018-01-11 — End: 2019-02-05

## 2018-01-11 NOTE — Assessment & Plan Note (Addendum)
Active  Worsening  Start Omeprazole, Clarithromycin and Metronidazole as prescribed  Avoid spicy, acidic, tomato-based, or fatty foods  Limit intake of coffee, tea, alcohol and soda beverages  Encouraged to exercise to lose weight  Avoid gorging during mealtimes, eating moderate amounts of food  Avoid bedtime snacks, latest meal should be 3-4 hours before bedtime  Monitor

## 2018-01-20 MED ORDER — RANITIDINE 300 MG TAB
300 mg | ORAL_TABLET | ORAL | 3 refills | Status: DC
Start: 2018-01-20 — End: 2019-07-10

## 2018-01-20 NOTE — Telephone Encounter (Signed)
Lab Results   Component Value Date/Time    Sodium 138 05/13/2017 07:40 AM    Potassium 4.0 05/13/2017 07:40 AM    Chloride 103 05/13/2017 07:40 AM    CO2 27 05/13/2017 07:40 AM    Anion gap 8 05/13/2017 07:40 AM    Glucose 102 05/13/2017 07:40 AM    BUN 20 (H) 05/13/2017 07:40 AM    Creatinine 0.98 05/13/2017 07:40 AM    Calcium 9.0 05/13/2017 07:40 AM    Bilirubin, total 0.6 05/13/2017 07:40 AM    AST (SGOT) 35 05/13/2017 07:40 AM    Alk. phosphatase 61 05/13/2017 07:40 AM    Protein, total 7.3 05/13/2017 07:40 AM    Albumin 4.1 05/13/2017 07:40 AM    ALT (SGPT) 32 05/13/2017 07:40 AM     Future Appointments   Date Time Provider [place]   04/24/2018  7:30 AM Lundquist, Ike Bene, MD [facility]

## 2018-01-31 ENCOUNTER — Inpatient Hospital Stay: Admit: 2018-01-31 | Payer: MEDICARE | Primary: Internal Medicine

## 2018-01-31 ENCOUNTER — Ambulatory Visit: Admit: 2018-01-31 | Discharge: 2018-01-31 | Payer: MEDICARE | Attending: Family Medicine | Primary: Internal Medicine

## 2018-01-31 DIAGNOSIS — R300 Dysuria: Secondary | ICD-10-CM

## 2018-01-31 MED ORDER — NITROFURANTOIN (25% MACROCRYSTAL FORM) 100 MG CAP
100 mg | ORAL_CAPSULE | Freq: Two times a day (BID) | ORAL | 0 refills | Status: AC
Start: 2018-01-31 — End: 2018-02-07

## 2018-01-31 NOTE — Progress Notes (Signed)
[facility]   [place] [address], [place]  [place] [address]  [phone]      CHIEF COMPLAINT   Rhonda [name] is a 72 y.o. female who presents to clinic for UTI (x few weeks, took AZO, so cant do U/A, frequency).    HPI   Rhonda [name] comes to the office today for an acute appointment due to urinary symptoms.  She had urinary frequency, urgency, and dysuria about two weeks ago.  She took some AZO at that time and it settled her symptoms down for a bit.  However, in the last few days her symptoms returned with a vengance. She took an OTC product similar to AZO and that has calmed them down a bit again, but she is still having frequency.  She has pain in her pelvic region and occasional pain in her low back since this started back a few days ago. She is concerned that she might have a UTI.         Patient Active Problem List    Diagnosis Date Noted   ??? Severe obesity (BMI 35.0-39.9) 04/25/2017   ??? GERD without esophagitis 01/11/2017   ??? Hypertension, benign 01/06/2017   ??? Osteoarthritis 01/06/2017   ??? Lumbar spondylosis 10/22/2016   ??? Basal cell carcinoma of skin of right upper limb, including shoulder 07/06/2016   ??? Other screening mammogram 06/15/2016   ??? Skin lesion 06/15/2016   ??? Obstructive sleep apnea syndrome 06/15/2016   ??? Rotator cuff tear, left 10/21/2015   ??? Encounter for general adult medical examination without abnormal findings 12/06/2014   ??? Seborrheic keratosis 07/08/2014   ??? Moderate persistent asthma 11/08/1958     Current Outpatient Medications   Medication Sig Dispense Refill   ??? neomycin-polymyxin-dexamethasone (DEXACINE) 3.5 mg/g-10,000 unit/g-0.1 % ophthalmic ointment apply TO CORNER OF RIGHT EYE BEFORE BED FOR 1 WEEK  0   ??? nitrofurantoin, macrocrystal-monohydrate, (MACROBID) 100 mg capsule Take 1 Cap by mouth two (2) times a day for 7 days. 14 Cap 0   ??? raNITIdine (ZANTAC) 300 mg tab  TAKE 1 TABLET BY MOUTH TWICE DAILY IF NEEDED FOR GERD 180 Tab 3    ??? ADVAIR DISKUS 250-50 mcg/dose diskus inhaler inhale 1 dose by mouth twice a day 3 Inhaler 3   ??? hydroCHLOROthiazide (HYDRODIURIL) 25 mg tablet take 1 tablet by mouth once daily 90 Tab 1   ??? albuterol (PROVENTIL HFA) 90 mcg/actuation inhaler Take 2 Puffs by inhalation every six (6) hours as needed for Wheezing. Inhale 2 puffs four times a day if needed 3 Inhaler 2   ??? albuterol-ipratropium (DUO-NEB) 2.5 mg-0.5 mg/3 ml nebu 3 mL by Nebulization route every six (6) hours as needed. Use every 4 hours as needed for dyspena 30 Nebule 2   ??? varicella-zoster recombinant, PF, (SHINGRIX, PF,) 50 mcg/0.5 mL susr injection 0.5 ml IM, 1 dose now, repeat dose in 2-6 months for  Indications: PREVENTION OF HERPES ZOSTER 0.5 mL 1   ??? meloxicam (MOBIC) 15 mg tablet  TAKE 1/2 TABLET BY MOUTH TWICE DAILY AS DIRECTED 45 Tab 3   ??? lisinopril (PRINIVIL, ZESTRIL) 40 mg tablet Take 2 Tabs by mouth daily. 180 Tab 3   ??? cpap machine kit Aut[employer]et CPAP 6-10cmH2O.       Allergies   Allergen Reactions   ??? Cefaclor Not Reported This Time   ??? Methylprednisolone Other (comments)     Pt states that she has a reaction    ???  Prednisone Not Reported This Time     Past Medical History:   Diagnosis Date   ??? Asthma    ??? HTN (hypertension)    ??? Hx of colonoscopy 2005     Past Surgical History:   Procedure Laterality Date   ??? HX CATARACT REMOVAL Left    ??? HX ROTATOR CUFF REPAIR  2007    Dr. Mickie Bail - [place]   ??? HX TONSILLECTOMY  1952     Family History   Problem Relation Age of Onset   ??? No Known Problems Son    ??? No Known Problems Daughter      Social History     Tobacco Use   ??? Smoking status: Never Smoker   ??? Smokeless tobacco: Never Used   Substance Use Topics   ??? Alcohol use: Yes     Comment: Rare social ETOH          REVIEW OF SYSTEMS   Review of Systems   Constitutional: Negative for chills, diaphoresis, fever and malaise/fatigue.         PHYSICAL EXAM     Vitals:    01/31/18 0908   BP: 150/78   Pulse: 78   Weight: 226 lb (102.5 kg)    Height: '5\' 5"'  (1.651 m)       Physical Exam   Constitutional: She is well-developed, well-nourished, and in no distress.   HENT:   Head: Normocephalic and atraumatic.   Eyes: Pupils are equal, round, and reactive to light. Conjunctivae and EOM are normal.   Cardiovascular: Normal rate, regular rhythm, S1 normal, S2 normal and normal heart sounds. Exam reveals no gallop and no friction rub.   No murmur heard.  Pulmonary/Chest: Breath sounds normal. She has no wheezes. She has no rales.   Abdominal: Soft. Bowel sounds are normal. She exhibits no distension. There is no tenderness. There is no CVA tenderness.   Psychiatric: Affect normal.   Nursing note and vitals reviewed.      Assessment and Plan       ICD-10-CM ICD-9-CM    1. Dysuria R30.0 788.1 CULTURE, URINE      nitrofurantoin, macrocrystal-monohydrate, (MACROBID) 100 mg capsule      We discussed that her symptoms are due to a UTI and she will be treated empirically for this.  As we cannot dip her urine the office it will be sent for C&S.  She is aware that we might need to change antibiotics.  We also discussed that AZO is an urinary anti-inflammatory but does not treat an infection, which is a common misconception.    Follow-up and Dispositions    ?? Return if symptoms worsen or fail to improve.       Future Appointments   Date Time Provider [place]   04/24/2018  7:30 AM Kendra Grissett, Ike Bene, MD [facility]       Lance Morin, MD  01/31/2018

## 2018-02-02 LAB — CULTURE, URINE

## 2018-02-02 MED ORDER — MELOXICAM 15 MG TAB
15 mg | ORAL_TABLET | ORAL | 3 refills | Status: DC
Start: 2018-02-02 — End: 2019-02-05

## 2018-02-02 NOTE — Progress Notes (Signed)
Your urine culture came back as contaminated.  I hope you are feeling better on the antibiotics. If after the treatment is done the symptoms return, we will need to do another urine culture.

## 2018-02-02 NOTE — Telephone Encounter (Signed)
Got a fax wanting 90 day supply of meloxicam

## 2018-03-06 ENCOUNTER — Encounter: Attending: Family Medicine | Primary: Internal Medicine

## 2018-03-07 ENCOUNTER — Encounter: Attending: Family Medicine | Primary: Internal Medicine

## 2018-03-08 ENCOUNTER — Encounter (INDEPENDENT_AMBULATORY_CARE_PROVIDER_SITE_OTHER): Payer: Self-pay | Admitting: Physician Assistant

## 2018-03-08 ENCOUNTER — Ambulatory Visit (INDEPENDENT_AMBULATORY_CARE_PROVIDER_SITE_OTHER): Admitting: Physician Assistant

## 2018-03-08 VITALS — BP 110/74 | HR 92 | Temp 98.4°F | Resp 16 | Ht 62.6 in | Wt 137.4 lb

## 2018-03-08 DIAGNOSIS — M25561 Pain in right knee: Secondary | ICD-10-CM

## 2018-03-08 DIAGNOSIS — H538 Other visual disturbances: Principal | ICD-10-CM

## 2018-03-08 DIAGNOSIS — G8929 Other chronic pain: Secondary | ICD-10-CM

## 2018-03-08 NOTE — Assessment & Plan Note (Signed)
Active  Referred to opthalmology  Monitor

## 2018-03-08 NOTE — Assessment & Plan Note (Signed)
Active  Referred to ortho  Awaiting xray results  Continue NSAIDs PRN for pain and swelling  Monitor

## 2018-03-08 NOTE — Progress Notes (Signed)
Encounter Date:  03/08/18  7:38 AM   PCP: Adrienne Mocha  MRN: [id number]  DOB: 1946-03-20       HPI:  Kathy Cherry is a 72 year old female presents   Chief Complaint   Patient presents with   . Referral/authorization     Per patient requesting for referral to opthalmology due to vision getting blurry.  Patient requesting for Ortho referral due to having right knee pain.  Per patient had a full knee replacement 6 years ago.      Here for referrals to ophthalmology due to changes in vision/ blurred vision and orthopedics due to right knee pain. Patient has hx of knee replacement in that knee.     PROBLEM  LIST:  Patient Active Problem List   Diagnosis   . GERD (gastroesophageal reflux disease)   . IBS (irritable bowel syndrome)   . Right knee pain   . Left shoulder pain   . Allergic rhinitis   . History of bilateral breast cancer   . Hypertriglyceridemia   . Blurred vision, bilateral         PAST MEDICAL HISTORY:  Past Medical History:   Diagnosis Date   . Colitis     x 2 09/24/2017 and 10/28/2017       PAST SURGICAL HISTORY:  Past Surgical History:   Procedure Laterality Date   . APPENDECTOMY      72 y/o   . Bleeding Ulcers      72 y/o   . BREAST LUMPECTOMY Left     x 2    . CATARACT EXTRACTION     . LYMPHADENECTOMY Left    . SHOULDER OPEN ROTATOR CUFF REPAIR Left 2015   . TOTAL KNEE ARTHROPLASTY  2013        FAMILY HISTORY:   Family History   Problem Relation Name Age of Onset   . Breast Cancer Mother     . Diabetes Mother     . Hypertension Mother     . Obesity Mother     . Colon Cancer Father     . Diabetes Father     . Hypertension Father     . Obesity Father     . Breast Cancer Maternal Grandmother     . Breast Cancer Paternal Grandmother           SOCIAL HISTORY:  Social History     Socioeconomic History   . Marital status: Married     Spouse name: Not on file   . Number of children: Not on file   . Years of education: Not on file   . Highest education level: Not on file   Occupational History   . Not on file      Social Needs   . Financial resource strain: Not on file   . Food insecurity:     Worry: Not on file     Inability: Not on file   . Transportation needs:     Medical: Not on file     Non-medical: Not on file   Tobacco Use   . Smoking status: Former Smoker     Last attempt to quit: 2006     Years since quitting: 13.3   . Smokeless tobacco: Never Used   Substance and Sexual Activity   . Alcohol use: Yes     Alcohol/week: 5.0 oz     Types: 1 Glasses of wine per week     Frequency: 2-4 times  a month     Comment: rare   . Drug use: No   . Sexual activity: Not on file   Lifestyle   . Physical activity:     Days per week: Not on file     Minutes per session: Not on file   . Stress: Not on file   Relationships   . Social connections:     Talks on phone: Not on file     Gets together: Not on file     Attends religious service: Not on file     Active member of club or organization: Not on file     Attends meetings of clubs or organizations: Not on file     Relationship status: Not on file   . Intimate partner violence:     Fear of current or ex partner: Not on file     Emotionally abused: Not on file     Physically abused: Not on file     Forced sexual activity: Not on file   Other Topics Concern   . Not on file   Social History Narrative   . Not on file     Social History     Tobacco Use   Smoking Status Former Smoker   . Last attempt to quit: 2006   . Years since quitting: 13.3   Smokeless Tobacco Never Used      Social History     Substance and Sexual Activity   Alcohol Use Yes   . Alcohol/week: 5.0 oz   . Types: 1 Glasses of wine per week   . Frequency: 2-4 times a month    Comment: rare      Social History     Substance and Sexual Activity   Drug Use No          There is no immunization history on file for this patient.      CURRENT  MEDICATIONS:  Current Outpatient Medications on File Prior to Visit   Medication Sig Dispense Refill   . atorvastatin (LIPITOR) 20 MG tablet Take 1 tablet (20 mg) by mouth daily. 90 tablet  3   . clarithromycin (BIAXIN) 500 MG tablet Take 1 tablet (500 mg) by mouth 2 times daily. 20 tablet 0   . diphenoxylate-atropine (LOMOTIL) 2.5-0.025 MG tablet Take 1 tablet by mouth 4 times daily as needed for Diarrhea. 60 tablet 1   . metroNIDAZOLE (FLAGYL) 500 MG tablet Take 1 tablet (500 mg) by mouth 3 times daily. 30 tablet 0   . omeprazole (PRILOSEC) 40 MG capsule Take 1 capsule (40 mg) by mouth daily. 30 capsule 0   . ranitidine (ZANTAC) 150 MG tablet Take 1 tablet (150 mg) by mouth 2 times daily as needed for Heartburn. 60 tablet 1     No current facility-administered medications on file prior to visit.      Outpatient Medications Marked as Taking for the 03/08/18 encounter (Office Visit) with Tomasita Morrow, PA   Medication Sig Dispense Refill   . atorvastatin (LIPITOR) 20 MG tablet Take 1 tablet (20 mg) by mouth daily. 90 tablet 3   . clarithromycin (BIAXIN) 500 MG tablet Take 1 tablet (500 mg) by mouth 2 times daily. 20 tablet 0   . diphenoxylate-atropine (LOMOTIL) 2.5-0.025 MG tablet Take 1 tablet by mouth 4 times daily as needed for Diarrhea. 60 tablet 1   . metroNIDAZOLE (FLAGYL) 500 MG tablet Take 1 tablet (500 mg) by mouth 3 times daily. 30 tablet  0   . omeprazole (PRILOSEC) 40 MG capsule Take 1 capsule (40 mg) by mouth daily. 30 capsule 0   . ranitidine (ZANTAC) 150 MG tablet Take 1 tablet (150 mg) by mouth 2 times daily as needed for Heartburn. 60 tablet 1        ALLERGIES:    Allergies   Allergen Reactions   . Valium [Diazepam] Other     Per pt gets hyper sensitive          REVIEW OF SYSTEMS:  Review of Systems   Constitutional: Negative for chills, fatigue and fever.   Eyes: Positive for visual disturbance. Negative for photophobia, pain, discharge, redness and itching.   Respiratory: Negative for cough, chest tightness and shortness of breath.    Cardiovascular: Negative for chest pain, palpitations and leg swelling.   Endocrine: Negative.    Genitourinary: Negative.    Musculoskeletal:  Positive for arthralgias and gait problem.   Skin: Negative.    Allergic/Immunologic: Negative.    Neurological: Negative for dizziness, syncope, weakness, light-headedness, numbness and headaches.   Hematological: Negative.    Psychiatric/Behavioral: Negative.    All other systems reviewed and are negative.        PHYSICAL EXAM:   03/08/18  1002   BP: 110/74   Pulse: 92   Resp: 16   Temp: 98.4 F (36.9 C)     Body mass index is 24.65 kg/m.    Ht Readings from Last 1 Encounters:   03/08/18 5' 2.6" (1.59 m)     Wt Readings from Last 1 Encounters:   03/08/18 62.3 kg (137 lb 6.4 oz)           Physical Exam   Constitutional: She is oriented to person, place, and time. She appears well-developed and well-nourished. No distress.   HENT:   Head: Normocephalic and atraumatic.   Eyes: EOM are normal.   Neck: Normal range of motion.   Cardiovascular: Normal rate, regular rhythm, normal heart sounds and intact distal pulses. Exam reveals no gallop and no friction rub.   No murmur heard.  Pulmonary/Chest: Effort normal and breath sounds normal. No stridor. No respiratory distress. She has no wheezes. She has no rales. She exhibits no tenderness.   Musculoskeletal: Normal range of motion. She exhibits no edema, tenderness or deformity.   Neurological: She is alert and oriented to person, place, and time. She has normal reflexes. She displays normal reflexes. No cranial nerve deficit. She exhibits normal muscle tone. Coordination normal.   Skin: Skin is warm, dry and intact. No ecchymosis, no laceration, no lesion and no rash noted. No erythema. No pallor.   Psychiatric: She has a normal mood and affect. Her speech is normal and behavior is normal. Judgment and thought content normal. Her mood appears not anxious. Her affect is not inappropriate. She is not agitated and not aggressive. Cognition and memory are normal. Cognition and memory are not impaired. She does not exhibit a depressed mood. She exhibits normal recent memory  and normal remote memory.   Nursing note and vitals reviewed.          ASSESSMENT & PLAN:    Kathy Cherry was seen today for referral/authorization.    Diagnoses and all orders for this visit:    Blurred vision, bilateral  Assessment & Plan:  Active  Referred to opthalmology  Monitor    Orders:  -     Ophthalmology Clinic    Chronic pain of right knee  Assessment & Plan:  Active  Referred to ortho  Awaiting xray results  Continue NSAIDs PRN for pain and swelling  Monitor    Orders:  -     Orthopedics Clinic        ICD-10-CM ICD-9-CM    1. Blurred vision, bilateral H53.8 368.8 Ophthalmology Clinic   2. Chronic pain of right knee M25.561 719.46 Orthopedics Clinic    G89.29 338.29              [address] [place]  [facility]  [email or web address]

## 2018-03-17 ENCOUNTER — Telehealth (INDEPENDENT_AMBULATORY_CARE_PROVIDER_SITE_OTHER): Payer: Self-pay | Admitting: Family Medicine

## 2018-03-17 NOTE — Telephone Encounter (Signed)
Please notify patient that her knee xray looks good. A little bit of swelling. She can consider a knee brace  But otherwise not much to do

## 2018-03-17 NOTE — Telephone Encounter (Signed)
Pt.notified

## 2018-03-22 ENCOUNTER — Ambulatory Visit (INDEPENDENT_AMBULATORY_CARE_PROVIDER_SITE_OTHER): Admitting: Physician Assistant

## 2018-03-22 VITALS — BP 118/78 | HR 96 | Temp 98.6°F | Resp 16 | Ht 62.6 in | Wt 139.4 lb

## 2018-03-22 DIAGNOSIS — Z96651 Presence of right artificial knee joint: Principal | ICD-10-CM

## 2018-03-22 DIAGNOSIS — M25561 Pain in right knee: Principal | ICD-10-CM

## 2018-03-22 DIAGNOSIS — G8929 Other chronic pain: Principal | ICD-10-CM

## 2018-03-22 NOTE — Assessment & Plan Note (Addendum)
Active  F/u with ortho   Continue RICE tx  Continue NSAIDs PRN for pain and swelling  Monitor

## 2018-03-22 NOTE — Progress Notes (Signed)
Encounter Date:  03/22/18  8:04 AM   PCP: Adrienne Mocha  MRN: [id number]  DOB: [birth year]       HPI:  Kathy Cherry is a 72 year old female presents   Chief Complaint   Patient presents with   . Follow up Results     Knee X-ray of the right knee      Here for f/u on knee xray of right knee 12/22/17    Xray shows unremarkable right total knee arthroplasty with small effusion.     Notes she is still having pain and swelling of the knee.  Reports she has begun to use knee bracing.         PROBLEM  LIST:  Patient Active Problem List   Diagnosis   . GERD (gastroesophageal reflux disease)   . IBS (irritable bowel syndrome)   . Chronic knee pain after total replacement of right knee joint   . Left shoulder pain   . Allergic rhinitis   . History of bilateral breast cancer   . Hypertriglyceridemia   . Blurred vision, bilateral         PAST MEDICAL HISTORY:  Past Medical History:   Diagnosis Date   . Colitis     x 2 09/24/2017 and 10/28/2017       PAST SURGICAL HISTORY:  Past Surgical History:   Procedure Laterality Date   . APPENDECTOMY      72 y/o   . Bleeding Ulcers      72 y/o   . BREAST LUMPECTOMY Left     x 2    . CATARACT EXTRACTION     . LYMPHADENECTOMY Left    . SHOULDER OPEN ROTATOR CUFF REPAIR Left 2015   . TOTAL KNEE ARTHROPLASTY  2013        FAMILY HISTORY:   Family History   Problem Relation Name Age of Onset   . Breast Cancer Mother     . Diabetes Mother     . Hypertension Mother     . Obesity Mother     . Colon Cancer Father     . Diabetes Father     . Hypertension Father     . Obesity Father     . Breast Cancer Maternal Grandmother     . Breast Cancer Paternal Grandmother           SOCIAL HISTORY:  Social History     Socioeconomic History   . Marital status: Married     Spouse name: Not on file   . Number of children: Not on file   . Years of education: Not on file   . Highest education level: Not on file   Occupational History   . Not on file   Social Needs   . Financial resource strain: Not on file   . Food  insecurity:     Worry: Not on file     Inability: Not on file   . Transportation needs:     Medical: Not on file     Non-medical: Not on file   Tobacco Use   . Smoking status: Former Smoker     Last attempt to quit: 2006     Years since quitting: 13.3   . Smokeless tobacco: Never Used   Substance and Sexual Activity   . Alcohol use: Yes     Alcohol/week: 5.0 oz     Types: 1 Glasses of wine per week     Frequency: 2-4  times a month     Comment: rare   . Drug use: No   . Sexual activity: Not on file   Lifestyle   . Physical activity:     Days per week: Not on file     Minutes per session: Not on file   . Stress: Not on file   Relationships   . Social connections:     Talks on phone: Not on file     Gets together: Not on file     Attends religious service: Not on file     Active member of club or organization: Not on file     Attends meetings of clubs or organizations: Not on file     Relationship status: Not on file   . Intimate partner violence:     Fear of current or ex partner: Not on file     Emotionally abused: Not on file     Physically abused: Not on file     Forced sexual activity: Not on file   Other Topics Concern   . Not on file   Social History Narrative   . Not on file     Social History     Tobacco Use   Smoking Status Former Smoker   . Last attempt to quit: 2006   . Years since quitting: 13.3   Smokeless Tobacco Never Used      Social History     Substance and Sexual Activity   Alcohol Use Yes   . Alcohol/week: 5.0 oz   . Types: 1 Glasses of wine per week   . Frequency: 2-4 times a month    Comment: rare      Social History     Substance and Sexual Activity   Drug Use No          There is no immunization history on file for this patient.      CURRENT  MEDICATIONS:  Current Outpatient Medications on File Prior to Visit   Medication Sig Dispense Refill   . atorvastatin (LIPITOR) 20 MG tablet Take 1 tablet (20 mg) by mouth daily. 90 tablet 3   . clarithromycin (BIAXIN) 500 MG tablet Take 1 tablet (500 mg)  by mouth 2 times daily. 20 tablet 0   . diphenoxylate-atropine (LOMOTIL) 2.5-0.025 MG tablet Take 1 tablet by mouth 4 times daily as needed for Diarrhea. 60 tablet 1   . metroNIDAZOLE (FLAGYL) 500 MG tablet Take 1 tablet (500 mg) by mouth 3 times daily. 30 tablet 0   . omeprazole (PRILOSEC) 40 MG capsule Take 1 capsule (40 mg) by mouth daily. 30 capsule 0   . ranitidine (ZANTAC) 150 MG tablet Take 1 tablet (150 mg) by mouth 2 times daily as needed for Heartburn. 60 tablet 1     No current facility-administered medications on file prior to visit.      Outpatient Medications Marked as Taking for the 03/22/18 encounter (Office Visit) with Tomasita Morrow, PA   Medication Sig Dispense Refill   . atorvastatin (LIPITOR) 20 MG tablet Take 1 tablet (20 mg) by mouth daily. 90 tablet 3   . clarithromycin (BIAXIN) 500 MG tablet Take 1 tablet (500 mg) by mouth 2 times daily. 20 tablet 0   . diphenoxylate-atropine (LOMOTIL) 2.5-0.025 MG tablet Take 1 tablet by mouth 4 times daily as needed for Diarrhea. 60 tablet 1   . metroNIDAZOLE (FLAGYL) 500 MG tablet Take 1 tablet (500 mg) by mouth 3 times daily. 30  tablet 0   . omeprazole (PRILOSEC) 40 MG capsule Take 1 capsule (40 mg) by mouth daily. 30 capsule 0   . ranitidine (ZANTAC) 150 MG tablet Take 1 tablet (150 mg) by mouth 2 times daily as needed for Heartburn. 60 tablet 1        ALLERGIES:    Allergies   Allergen Reactions   . Valium [Diazepam] Other     Per pt gets hyper sensitive          REVIEW OF SYSTEMS:  Review of Systems   Constitutional: Negative for chills, fatigue and fever.   Eyes: Negative.    Respiratory: Negative for cough, chest tightness and shortness of breath.    Cardiovascular: Negative for chest pain, palpitations and leg swelling.   Endocrine: Negative.    Genitourinary: Negative.    Musculoskeletal: Positive for arthralgias and myalgias.   Skin: Negative.    Allergic/Immunologic: Negative.    Neurological: Negative for dizziness, syncope, weakness,  light-headedness, numbness and headaches.   Hematological: Negative.    Psychiatric/Behavioral: Negative.    All other systems reviewed and are negative.        PHYSICAL EXAM:   03/22/18  1025   BP: 118/78   Pulse: 96   Resp: 16   Temp: 98.6 F (37 C)     Body mass index is 25.01 kg/m.    Ht Readings from Last 1 Encounters:   03/22/18 5' 2.6" (1.59 m)     Wt Readings from Last 1 Encounters:   03/22/18 63.2 kg (139 lb 6.4 oz)           Physical Exam   Constitutional: She is oriented to person, place, and time. She appears well-developed and well-nourished. No distress.   HENT:   Head: Normocephalic and atraumatic.   Eyes: EOM are normal.   Neck: Normal range of motion.   Cardiovascular: Normal rate, regular rhythm, normal heart sounds and intact distal pulses. Exam reveals no gallop and no friction rub.   No murmur heard.  Pulmonary/Chest: Effort normal and breath sounds normal. No stridor. No respiratory distress. She has no wheezes. She has no rales. She exhibits no tenderness.   Musculoskeletal: Normal range of motion. She exhibits edema and tenderness. She exhibits no deformity.   Edema and TTP at right knee   Neurological: She is alert and oriented to person, place, and time. She has normal reflexes. She displays normal reflexes. No cranial nerve deficit. She exhibits normal muscle tone. Coordination normal.   Skin: Skin is warm, dry and intact. Capillary refill takes less than 2 seconds. No ecchymosis, no laceration, no lesion and no rash noted. No erythema. No pallor.   Psychiatric: She has a normal mood and affect. Her speech is normal and behavior is normal. Judgment and thought content normal. Her mood appears not anxious. Her affect is not inappropriate. She is not agitated and not aggressive. Cognition and memory are normal. Cognition and memory are not impaired. She does not exhibit a depressed mood. She exhibits normal recent memory and normal remote memory.   Nursing note and vitals  reviewed.          ASSESSMENT & PLAN:    Kathy Cherry was seen today for follow up results.    Diagnoses and all orders for this visit:    Chronic knee pain after total replacement of right knee joint  Assessment & Plan:  Active  F/u with ortho   Continue RICE tx  Continue NSAIDs PRN for  pain and swelling  Monitor          ICD-10-CM ICD-9-CM    1. Chronic knee pain after total replacement of right knee joint M25.561 719.46 CANCELED: Orthopedics Clinic    G89.29 V43.65     Z96.651               Tomasita Morrow, Cordelia Poche  [facility]  [email or web address]

## 2018-03-23 NOTE — Telephone Encounter (Signed)
Pt's husband called [facility] requested for he and his wife to switch providers to any MD only located at [facility], as this is near providers they already see.    Please advise.    CB# [phone] (home)     Candelaria Stagers  03/23/2018  2:05 PM

## 2018-03-31 NOTE — Telephone Encounter (Signed)
Pt called [facility] wanting a pcp transfer from Dr. Juliann Pulse.  Pt req. MD    She felt that her concerns were not been addressed.    Pt to see Dr. Glade Lloyd on 6/21

## 2018-03-31 NOTE — Telephone Encounter (Signed)
Noted  

## 2018-04-17 ENCOUNTER — Ambulatory Visit (INDEPENDENT_AMBULATORY_CARE_PROVIDER_SITE_OTHER): Admitting: Family Medicine

## 2018-04-17 VITALS — BP 118/68 | HR 72 | Temp 98.4°F | Resp 14 | Ht 62.5 in | Wt 137.0 lb

## 2018-04-17 LAB — UA, CHEM ONLY POCT
Nitrite: POSITIVE
Specific Gravity: 1.02 (ref 1.002–1.030)
pH: 5 (ref 5–8)

## 2018-04-17 MED ORDER — CIPROFLOXACIN HCL 250 MG OR TABS
250.0000 mg | ORAL_TABLET | Freq: Two times a day (BID) | ORAL | 0 refills | Status: DC
Start: 2018-04-17 — End: 2019-01-08

## 2018-04-17 NOTE — Assessment & Plan Note (Signed)
   Stable   UA done in office positive for infection   Take antibiotics as prescribed   Advised patient to drink plenty of fluids (e.g. cranberry juice)   Take Pyridium otc PRN   Send for Urine C & S   F/u if not better in 3-5 days   Discussed risk factors for UTI, would defer vaginal estrogen in this patient due to history of breast cancer

## 2018-04-17 NOTE — Progress Notes (Signed)
Encounter Date:  04/17/2018  11:58 AM   PCP: Adrienne Mocha  MRN: [id number]  DOB: 04-06-1946     CC: UTI Symptoms (burning x 2days)      HPI:  Kathy Cherry is a 72 year old female who presents today for UTI Symptoms (burning x 2days)    UTI symptoms   Patient reports burning and frequency x 2 days  Took Azo otc  Still with pain with urination  Denies fever, flank pain, abdominal pain  Tolerating POs  Otherwise well       PROBLEM  LIST:  Patient Active Problem List   Diagnosis   . GERD (gastroesophageal reflux disease)   . IBS (irritable bowel syndrome)   . Chronic knee pain after total replacement of right knee joint   . Left shoulder pain   . Allergic rhinitis   . History of bilateral breast cancer   . Hypertriglyceridemia   . Blurred vision, bilateral   . Acute cystitis with hematuria         PAST MEDICAL HISTORY:  Past Medical History:   Diagnosis Date   . Colitis     x 2 09/24/2017 and 10/28/2017       PAST SURGICAL HISTORY:  Past Surgical History:   Procedure Laterality Date   . APPENDECTOMY      72 y/o   . Bleeding Ulcers      72 y/o   . BREAST LUMPECTOMY Left     x 2    . CATARACT EXTRACTION     . LYMPHADENECTOMY Left    . SHOULDER OPEN ROTATOR CUFF REPAIR Left 2015   . TOTAL KNEE ARTHROPLASTY  2013        FAMILY HISTORY:   Family History   Problem Relation Name Age of Onset   . Breast Cancer Mother     . Diabetes Mother     . Hypertension Mother     . Obesity Mother     . Colon Cancer Father     . Diabetes Father     . Hypertension Father     . Obesity Father     . Breast Cancer Maternal Grandmother     . Breast Cancer Paternal Grandmother           SOCIAL HISTORY:  Social History     Socioeconomic History   . Marital status: Married     Spouse name: Not on file   . Number of children: Not on file   . Years of education: Not on file   . Highest education level: Not on file   Occupational History   . Not on file   Social Needs   . Financial resource strain: Not on file   . Food insecurity:     Worry: Not on file      Inability: Not on file   . Transportation needs:     Medical: Not on file     Non-medical: Not on file   Tobacco Use   . Smoking status: Former Smoker     Last attempt to quit: 2006     Years since quitting: 13.4   . Smokeless tobacco: Never Used   Substance and Sexual Activity   . Alcohol use: Yes     Alcohol/week: 5.0 oz     Types: 1 Glasses of wine per week     Frequency: 2-4 times a month     Comment: rare   . Drug use: No   .  Sexual activity: Not on file   Lifestyle   . Physical activity:     Days per week: Not on file     Minutes per session: Not on file   . Stress: Not on file   Relationships   . Social connections:     Talks on phone: Not on file     Gets together: Not on file     Attends religious service: Not on file     Active member of club or organization: Not on file     Attends meetings of clubs or organizations: Not on file     Relationship status: Not on file   . Intimate partner violence:     Fear of current or ex partner: Not on file     Emotionally abused: Not on file     Physically abused: Not on file     Forced sexual activity: Not on file   Other Topics Concern   . Not on file   Social History Narrative   . Not on file     Social History     Tobacco Use   Smoking Status Former Smoker   . Last attempt to quit: 2006   . Years since quitting: 13.4   Smokeless Tobacco Never Used      Social History     Substance and Sexual Activity   Alcohol Use Yes   . Alcohol/week: 5.0 oz   . Types: 1 Glasses of wine per week   . Frequency: 2-4 times a month    Comment: rare      Social History     Substance and Sexual Activity   Drug Use No          There is no immunization history on file for this patient.      CURRENT  MEDICATIONS:  Current Outpatient Medications on File Prior to Visit   Medication Sig Dispense Refill   . atorvastatin (LIPITOR) 20 MG tablet Take 1 tablet (20 mg) by mouth daily. 90 tablet 3   . clarithromycin (BIAXIN) 500 MG tablet Take 1 tablet (500 mg) by mouth 2 times daily. 20 tablet 0    . diphenoxylate-atropine (LOMOTIL) 2.5-0.025 MG tablet Take 1 tablet by mouth 4 times daily as needed for Diarrhea. 60 tablet 1   . metroNIDAZOLE (FLAGYL) 500 MG tablet Take 1 tablet (500 mg) by mouth 3 times daily. 30 tablet 0   . omeprazole (PRILOSEC) 40 MG capsule Take 1 capsule (40 mg) by mouth daily. 30 capsule 0   . ranitidine (ZANTAC) 150 MG tablet Take 1 tablet (150 mg) by mouth 2 times daily as needed for Heartburn. 60 tablet 1     No current facility-administered medications on file prior to visit.      No outpatient medications have been marked as taking for the 04/17/18 encounter (Office Visit) with Kandis Cocking, MD.        ALLERGIES:    Allergies   Allergen Reactions   . Valium [Diazepam] Other     Per pt gets hyper sensitive          REVIEW OF SYSTEMS:  Review of Systems  Denies unexplained weight loss  Denies chest pain  Denies SOB  Denies abdominal pain  Denies LEE        PHYSICAL EXAM:   04/17/18  1149   BP: 118/68   Pulse: 72   Resp: 14   Temp: 98.4 F (36.9 C)  Body mass index is 24.66 kg/m.    Ht Readings from Last 1 Encounters:   04/17/18 5' 2.5" (1.588 m)     Wt Readings from Last 1 Encounters:   04/17/18 62.1 kg (137 lb)           Physical Exam  Gen: NAD  CV: RRR, S1/S2 present, no murmur   Pulm: Good air movement, CTAB  Abd: soft, mild suprapubic tenderness, nondistended, (+) BS  Ext: no edema        ASSESSMENT & PLAN:    Kathy Cherry was seen today for uti symptoms.    Diagnoses and all orders for this visit:    Dysuria  -     UA, Chem Only (POCT)    Acute cystitis with hematuria  Assessment & Plan:   Stable   UA done in office positive for infection   Take antibiotics as prescribed   Advised patient to drink plenty of fluids (e.g. cranberry juice)   Take Pyridium otc PRN   Send for Urine C & S   F/u if not better in 3-5 days   Discussed risk factors for UTI, would defer vaginal estrogen in this patient due to history of breast cancer       Orders:  -     ciprofloxacin (CIPRO)  250 MG tablet; Take 1 tablet (250 mg) by mouth 2 times daily.        ICD-10-CM ICD-9-CM    1. Dysuria R30.0 788.1 UA, Chem Only (POCT)   2. Acute cystitis with hematuria N30.01 595.0 ciprofloxacin (CIPRO) 250 MG tablet           No future appointments.    Kandis Cocking, MD  [facility]  [email or web address]

## 2018-04-24 ENCOUNTER — Encounter: Attending: Family Medicine | Primary: Internal Medicine

## 2018-04-24 ENCOUNTER — Encounter

## 2018-04-25 MED ORDER — HYDROCHLOROTHIAZIDE 25 MG TAB
25 mg | ORAL_TABLET | ORAL | 1 refills | Status: DC
Start: 2018-04-25 — End: 2018-10-14

## 2018-04-28 ENCOUNTER — Ambulatory Visit: Attending: Internal Medicine | Primary: Internal Medicine

## 2018-04-28 ENCOUNTER — Ambulatory Visit: Admit: 2018-04-28 | Discharge: 2018-04-28 | Payer: MEDICARE | Attending: Internal Medicine | Primary: Internal Medicine

## 2018-04-28 DIAGNOSIS — I10 Essential (primary) hypertension: Secondary | ICD-10-CM

## 2018-04-28 NOTE — Progress Notes (Signed)
[facility]   [address] [place] [address]  [phone]    CHIEF COMPLAINT   Rhonda [name] is a 72 y.o. female who presents to clinic for Follow-up     ASSESSMENT AND PLAN      SUMMARY:      In addition, the below problems were assessed during today's visit and if not discussed above in the Summary above or addressed below are stable based on history, physical exam, review of pertinent labs, studies and medications. Medications have been reconciled. Plan is to continue current medications and have patient follow-up as noted below with me. Orders were placed as noted below.      Diagnoses and all orders for this visit:    1. Hypertension, benign  BP is stable.     2. GERD without esophagitis  Stable, continue PPI    3. Obstructive sleep apnea syndrome  Continue CPAP    4. Encounter for general adult medical examination without abnormal findings  Last colonoscopy was in 08/23/2016, next in 10 years         HPI     Rhonda [name] is here for a new patient visit and follow up.   Medical h/o is notable for OSA, HTN, GERD and moderate persistent asthma.   BP is stable.   She is compliant with CPAP.   Breathing is stable.       Current Outpatient Medications:   ???  ascorbic acid, vitamin C, (VITAMIN C) 250 mg tablet, Take  by mouth., Disp: , Rfl:   ???  cyanocobalamin (VITAMIN B-12) 1,000 mcg tablet, Take 1,000 mcg by mouth daily., Disp: , Rfl:   ???  hydroCHLOROthiazide (HYDRODIURIL) 25 mg tablet, take 1 tablet by mouth once daily, Disp: 90 Tab, Rfl: 1  ???  meloxicam (MOBIC) 15 mg tablet,  TAKE 1/2 TABLET BY MOUTH TWICE DAILY AS DIRECTED, Disp: 90 Tab, Rfl: 3  ???  raNITIdine (ZANTAC) 300 mg tab,  TAKE 1 TABLET BY MOUTH TWICE DAILY IF NEEDED FOR GERD, Disp: 180 Tab, Rfl: 3  ???  ADVAIR DISKUS 250-50 mcg/dose diskus inhaler, inhale 1 dose by mouth twice a day, Disp: 3 Inhaler, Rfl: 3  ???  albuterol (PROVENTIL HFA) 90 mcg/actuation inhaler, Take 2 Puffs by inhalation every six (6) hours as needed for Wheezing. Inhale  2 puffs four times a day if needed, Disp: 3 Inhaler, Rfl: 2  ???  albuterol-ipratropium (DUO-NEB) 2.5 mg-0.5 mg/3 ml nebu, 3 mL by Nebulization route every six (6) hours as needed. Use every 4 hours as needed for dyspena, Disp: 30 Nebule, Rfl: 2  ???  lisinopril (PRINIVIL, ZESTRIL) 40 mg tablet, Take 2 Tabs by mouth daily., Disp: 180 Tab, Rfl: 3  ???  cpap machine kit, Autoset CPAP 6-10cmH2O., Disp: , Rfl:   ???  neomycin-polymyxin-dexamethasone (DEXACINE) 3.5 mg/g-10,000 unit/g-0.1 % ophthalmic ointment, apply TO CORNER OF RIGHT EYE BEFORE BED FOR 1 WEEK, Disp: , Rfl: 0  ???  varicella-zoster recombinant, PF, (SHINGRIX, PF,) 50 mcg/0.5 mL susr injection, 0.5 ml IM, 1 dose now, repeat dose in 2-6 months for  Indications: PREVENTION OF HERPES ZOSTER, Disp: 0.5 mL, Rfl: 1    There are no discontinued medications.    Active Problem List and Past Medical History reviewed and updated today in the Electronic Health Record.     Allergies   Allergen Reactions   ??? Cefaclor Not Reported This Time   ??? Methylprednisolone Other (comments)     Pt states that she  has a reaction    ??? Prednisone Not Reported This Time         REVIEW OF SYSTEMS   Review of Systems   Constitutional: Negative for chills, fever and malaise/fatigue.   HENT: Negative for sore throat.    Respiratory: Negative for cough, shortness of breath and wheezing.    Cardiovascular: Negative for chest pain and leg swelling.   Gastrointestinal: Negative for abdominal pain, nausea and vomiting.   Musculoskeletal: Negative for back pain.   Neurological: Negative for dizziness, loss of consciousness and headaches.   Psychiatric/Behavioral: The patient is not nervous/anxious.          PHYSICAL EXAM     Vitals:    04/28/18 0715   BP: 129/77   Pulse: 79   Weight: 217 lb (98.4 kg)   Height: '5\' 5"'$  (1.651 m)     Body mass index is 36.11 kg/m??.  BP Readings from Last 3 Encounters:   04/28/18 129/77   01/31/18 150/78   12/01/17 140/72     Wt Readings from Last 3 Encounters:   04/28/18 217  lb (98.4 kg)   01/31/18 226 lb (102.5 kg)   12/01/17 228 lb (103.4 kg)       Physical Exam   Constitutional: She is oriented to person, place, and time. She appears well-developed and well-nourished.   HENT:   Mouth/Throat: Oropharynx is clear and moist.   Eyes: Conjunctivae are normal.   Neck: Neck supple.   Cardiovascular: Normal heart sounds. Exam reveals no gallop and no friction rub.   No murmur heard.  Pulmonary/Chest: Breath sounds normal.   Abdominal: Bowel sounds are normal. There is no tenderness. There is no guarding.   Neurological: She is alert and oriented to person, place, and time.   Psychiatric: She has a normal mood and affect.   Nursing note and vitals reviewed.        LABS/IMAGING     Orders Placed This Encounter   ??? ascorbic acid, vitamin C, (VITAMIN C) 250 mg tablet     Sig: Take  by mouth.   ??? cyanocobalamin (VITAMIN B-12) 1,000 mcg tablet     Sig: Take 1,000 mcg by mouth daily.       Results from today's visit:    No results found for any visits on 04/28/18.    Most recent labs reviewed as below:            Follow-up and Dispositions    ?? Return in about 6 months (around 10/28/2018) for MWV40.       Future Appointments   Date Time Provider [place]   11/15/2018  9:00 AM Jerilee Field, MD [facility]       Jerilee Field, MD  04/28/2018

## 2018-04-28 NOTE — Progress Notes (Signed)
[facility]   [address] [place] [address]  [phone]    CHIEF COMPLAINT   Rhonda [name] is a 72 y.o. female who presents to clinic for Follow-up     ASSESSMENT AND PLAN      SUMMARY:      In addition, the below problems were assessed during today's visit and if not discussed above in the Summary above or addressed below are stable based on history, physical exam, review of pertinent labs, studies and medications. Medications have been reconciled. Plan is to continue current medications and have patient follow-up as noted below with me. Orders were placed as noted below.      Diagnoses and all orders for this visit:    1. Hypertension, benign  BP is stable.     2. GERD without esophagitis  Stable, continue PPI    3. Obstructive sleep apnea syndrome  Continue CPAP    4. Encounter for general adult medical examination without abnormal findings  Last colonoscopy was in 08/23/2016, next in 10 years         HPI     Rhonda [name] is here for a new patient visit and follow up.   Medical h/o is notable for OSA, HTN, GERD and moderate persistent asthma.   BP is stable.   She is compliant with CPAP.   Breathing is stable.       Current Outpatient Medications:   ???  ascorbic acid, vitamin C, (VITAMIN C) 250 mg tablet, Take  by mouth., Disp: , Rfl:   ???  cyanocobalamin (VITAMIN B-12) 1,000 mcg tablet, Take 1,000 mcg by mouth daily., Disp: , Rfl:   ???  hydroCHLOROthiazide (HYDRODIURIL) 25 mg tablet, take 1 tablet by mouth once daily, Disp: 90 Tab, Rfl: 1  ???  meloxicam (MOBIC) 15 mg tablet,  TAKE 1/2 TABLET BY MOUTH TWICE DAILY AS DIRECTED, Disp: 90 Tab, Rfl: 3  ???  raNITIdine (ZANTAC) 300 mg tab,  TAKE 1 TABLET BY MOUTH TWICE DAILY IF NEEDED FOR GERD, Disp: 180 Tab, Rfl: 3  ???  ADVAIR DISKUS 250-50 mcg/dose diskus inhaler, inhale 1 dose by mouth twice a day, Disp: 3 Inhaler, Rfl: 3  ???  albuterol (PROVENTIL HFA) 90 mcg/actuation inhaler, Take 2 Puffs by  inhalation every six (6) hours as needed for Wheezing. Inhale 2 puffs four times a day if needed, Disp: 3 Inhaler, Rfl: 2  ???  albuterol-ipratropium (DUO-NEB) 2.5 mg-0.5 mg/3 ml nebu, 3 mL by Nebulization route every six (6) hours as needed. Use every 4 hours as needed for dyspena, Disp: 30 Nebule, Rfl: 2  ???  lisinopril (PRINIVIL, ZESTRIL) 40 mg tablet, Take 2 Tabs by mouth daily., Disp: 180 Tab, Rfl: 3  ???  cpap machine kit, Autoset CPAP 6-10cmH2O., Disp: , Rfl:   ???  neomycin-polymyxin-dexamethasone (DEXACINE) 3.5 mg/g-10,000 unit/g-0.1 % ophthalmic ointment, apply TO CORNER OF RIGHT EYE BEFORE BED FOR 1 WEEK, Disp: , Rfl: 0  ???  varicella-zoster recombinant, PF, (SHINGRIX, PF,) 50 mcg/0.5 mL susr injection, 0.5 ml IM, 1 dose now, repeat dose in 2-6 months for  Indications: PREVENTION OF HERPES ZOSTER, Disp: 0.5 mL, Rfl: 1    There are no discontinued medications.    Active Problem List and Past Medical History reviewed and updated today in the Electronic Health Record.     Allergies   Allergen Reactions   ??? Cefaclor Not Reported This Time   ??? Methylprednisolone Other (comments)     Pt states that she  has a reaction    ??? Prednisone Not Reported This Time         REVIEW OF SYSTEMS   Review of Systems   Constitutional: Negative for chills, fever and malaise/fatigue.   HENT: Negative for sore throat.    Respiratory: Negative for cough, shortness of breath and wheezing.    Cardiovascular: Negative for chest pain and leg swelling.   Gastrointestinal: Negative for abdominal pain, nausea and vomiting.   Musculoskeletal: Negative for back pain.   Neurological: Negative for dizziness, loss of consciousness and headaches.   Psychiatric/Behavioral: The patient is not nervous/anxious.          PHYSICAL EXAM     Vitals:    04/28/18 0715   BP: 129/77   Pulse: 79   Weight: 217 lb (98.4 kg)   Height: '5\' 5"'  (1.651 m)     Body mass index is 36.11 kg/m??.  BP Readings from Last 3 Encounters:   04/28/18 129/77   01/31/18 150/78    12/01/17 140/72     Wt Readings from Last 3 Encounters:   04/28/18 217 lb (98.4 kg)   01/31/18 226 lb (102.5 kg)   12/01/17 228 lb (103.4 kg)       Physical Exam   Constitutional: She is oriented to person, place, and time. She appears well-developed and well-nourished.   HENT:   Mouth/Throat: Oropharynx is clear and moist.   Eyes: Conjunctivae are normal.   Neck: Neck supple.   Cardiovascular: Normal heart sounds. Exam reveals no gallop and no friction rub.   No murmur heard.  Pulmonary/Chest: Breath sounds normal.   Abdominal: Bowel sounds are normal. There is no tenderness. There is no guarding.   Neurological: She is alert and oriented to person, place, and time.   Psychiatric: She has a normal mood and affect.   Nursing note and vitals reviewed.        LABS/IMAGING     Orders Placed This Encounter   ??? ascorbic acid, vitamin C, (VITAMIN C) 250 mg tablet     Sig: Take  by mouth.   ??? cyanocobalamin (VITAMIN B-12) 1,000 mcg tablet     Sig: Take 1,000 mcg by mouth daily.       Results from today's visit:    No results found for any visits on 04/28/18.    Most recent labs reviewed as below:            Follow-up and Dispositions    ?? Return in about 6 months (around 10/28/2018) for MWV40.       Future Appointments   Date Time Provider [place]   11/15/2018  9:00 AM Jerilee Field, MD [facility]       Jerilee Field, MD  04/28/2018

## 2018-04-28 NOTE — Patient Instructions (Signed)
Sleep Apnea: Care Instructions  Your Care Instructions    Sleep apnea means that you frequently stop breathing for 10 seconds or longer during sleep. It can be mild to severe, based on the number of times an hour that you stop breathing or have slowed breathing.  Blocked or narrowed airways in your nose, mouth, or throat can cause sleep apnea. Your airway can become blocked when your throat muscles and tongue relax during sleep.  You can treat sleep apnea at home by making lifestyle changes. You also can use a CPAP breathing machine that keeps tissues in the throat from blocking your airway. Or your doctor may suggest that you use a breathing device while you sleep. It helps keep your airway open. This could be a device that you put in your mouth. In some cases, surgery may be needed to remove enlarged tissues in the throat.  Follow-up care is a key part of your treatment and safety. Be sure to make and go to all appointments, and call your doctor if you are having problems. It's also a good idea to know your test results and keep a list of the medicines you take.  How can you care for yourself at home?  ?? Lose weight, if needed. It may reduce the number of times you stop breathing or have slowed breathing.  ?? Sleep on your side. It may stop mild apnea. If you tend to roll onto your back, sew a pocket in the back of your pajama top. Put a tennis ball into the pocket, and stitch the pocket shut. This will help keep you from sleeping on your back.  ?? Avoid alcohol and medicines such as sleeping pills and sedatives before bed.  ?? Do not smoke. Smoking can make sleep apnea worse. If you need help quitting, talk to your doctor about stop-smoking programs and medicines. These can increase your chances of quitting for good.  ?? Prop up the head of your bed 4 to 6 inches by putting bricks under the legs of the bed.  ?? Treat breathing problems, such as a stuffy nose, caused by a cold or allergies.   ?? Try a continuous positive airway pressure (CPAP) breathing machine if your doctor recommends it. The machine keeps your airway open when you sleep.  ?? If CPAP does not work for you, ask your doctor if you can try other breathing machines. A bilevel positive airway pressure machine uses one type of air pressure for breathing in and another type for breathing out. Another device raises or lowers air pressure as needed while you breathe.  ?? Talk to your doctor if:  ? Your nose feels dry or bleeds when you use one of these machines. You may need to increase moisture in the air. A humidifier may help.  ? Your nose is runny or stuffy from using a breathing machine. Decongestants or a corticosteroid nasal spray may help.  ? You are sleepy during the day and it gets in the way of the normal things you do. Do not drive when you are drowsy.  When should you call for help?  Watch closely for changes in your health, and be sure to contact your doctor if:  ?? ?? You still have sleep apnea even though you have made lifestyle changes.   ?? ?? You are thinking of trying a device such as CPAP.   ?? ?? You are having problems using a CPAP or similar machine.   Where can you learn   more?  Go to http://www.healthwise.net/[employer].  Enter J936 in the search box to learn more about "Sleep Apnea: Care Instructions."  Current as of: July 13, 2017  Content Version: 11.9  ?? 2006-2018 [employer]. Care instructions adapted under license by [employer] (which disclaims liability or warranty for this information). If you have questions about a medical condition or this instruction, always ask your healthcare professional. [employer] disclaims any warranty or liability for your use of this information.

## 2018-05-04 ENCOUNTER — Ambulatory Visit (INDEPENDENT_AMBULATORY_CARE_PROVIDER_SITE_OTHER): Admitting: Family Medicine

## 2018-05-04 VITALS — BP 122/70 | HR 88 | Temp 97.9°F | Resp 14 | Ht 62.5 in | Wt 137.0 lb

## 2018-05-04 LAB — UA, CHEM ONLY POCT
Nitrite: POSITIVE
Specific Gravity: 1.025 (ref 1.002–1.030)
pH: 6 (ref 5–8)

## 2018-05-04 MED ORDER — CIPROFLOXACIN HCL 250 MG OR TABS
250.00 mg | ORAL_TABLET | Freq: Two times a day (BID) | ORAL | 1 refills | Status: AC
Start: 2018-05-04 — End: 2018-05-07

## 2018-05-10 ENCOUNTER — Other Ambulatory Visit (INDEPENDENT_AMBULATORY_CARE_PROVIDER_SITE_OTHER): Payer: Self-pay | Admitting: Family Medicine

## 2018-05-10 DIAGNOSIS — K219 Gastro-esophageal reflux disease without esophagitis: Principal | ICD-10-CM

## 2018-05-10 MED ORDER — RANITIDINE HCL 150 MG OR TABS
ORAL_TABLET | ORAL | 1 refills | Status: DC
Start: 2018-05-10 — End: 2019-02-05

## 2018-05-22 ENCOUNTER — Encounter

## 2018-05-23 MED ORDER — LISINOPRIL 40 MG TAB
40 mg | ORAL_TABLET | ORAL | 3 refills | Status: DC
Start: 2018-05-23 — End: 2019-05-16

## 2018-06-26 ENCOUNTER — Encounter (INDEPENDENT_AMBULATORY_CARE_PROVIDER_SITE_OTHER): Payer: Self-pay | Admitting: Family Medicine

## 2018-08-08 ENCOUNTER — Encounter (INDEPENDENT_AMBULATORY_CARE_PROVIDER_SITE_OTHER): Payer: Self-pay | Admitting: Family Medicine

## 2018-08-25 ENCOUNTER — Encounter (INDEPENDENT_AMBULATORY_CARE_PROVIDER_SITE_OTHER): Payer: Self-pay

## 2018-08-25 DIAGNOSIS — N952 Postmenopausal atrophic vaginitis: Secondary | ICD-10-CM

## 2018-09-06 ENCOUNTER — Ambulatory Visit: Attending: Internal Medicine | Primary: Internal Medicine

## 2018-09-06 ENCOUNTER — Ambulatory Visit: Admit: 2018-09-06 | Discharge: 2018-09-06 | Payer: MEDICARE | Attending: Internal Medicine | Primary: Internal Medicine

## 2018-09-06 DIAGNOSIS — Z1239 Encounter for other screening for malignant neoplasm of breast: Secondary | ICD-10-CM

## 2018-09-06 NOTE — Progress Notes (Signed)
[facility]   [place] [address]  [phone]    CHIEF COMPLAINT     Rhonda [name] is a 72 y.o. female who presents to clinic for Follow-up.    ASSESSMENT AND PLAN     SUMMARY:     Diagnoses and all orders for this visit:    1. Screening breast examination  Manual breast exam appears normal. Her last mammogram was 2 years ago. Will update mammogram today. She agrees with the plan.-     MAM MAMMO BI SCREENING INCL CAD; Future        HPI     Rhonda [name] is here for an acute visit.   She wishes to have a breat examination as well as a mammogram.   No h/o breast cancer or abnormal mammograms in the past.   She denies any nodules, pain or lymph node swelling.         Current Outpatient Medications:   ???  lisinopril (PRINIVIL, ZESTRIL) 40 mg tablet, take 2 tablets by mouth once daily, Disp: 180 Tab, Rfl: 3  ???  ascorbic acid, vitamin C, (VITAMIN C) 250 mg tablet, Take  by mouth., Disp: , Rfl:   ???  cyanocobalamin (VITAMIN B-12) 1,000 mcg tablet, Take 1,000 mcg by mouth daily., Disp: , Rfl:   ???  hydroCHLOROthiazide (HYDRODIURIL) 25 mg tablet, take 1 tablet by mouth once daily, Disp: 90 Tab, Rfl: 1  ???  meloxicam (MOBIC) 15 mg tablet,  TAKE 1/2 TABLET BY MOUTH TWICE DAILY AS DIRECTED, Disp: 90 Tab, Rfl: 3  ???  neomycin-polymyxin-dexamethasone (DEXACINE) 3.5 mg/g-10,000 unit/g-0.1 % ophthalmic ointment, apply TO CORNER OF RIGHT EYE BEFORE BED FOR 1 WEEK, Disp: , Rfl: 0  ???  raNITIdine (ZANTAC) 300 mg tab,  TAKE 1 TABLET BY MOUTH TWICE DAILY IF NEEDED FOR GERD, Disp: 180 Tab, Rfl: 3  ???  ADVAIR DISKUS 250-50 mcg/dose diskus inhaler, inhale 1 dose by mouth twice a day, Disp: 3 Inhaler, Rfl: 3  ???  albuterol (PROVENTIL HFA) 90 mcg/actuation inhaler, Take 2 Puffs by inhalation every six (6) hours as needed for Wheezing. Inhale 2 puffs four times a day if needed, Disp: 3 Inhaler, Rfl: 2  ???  albuterol-ipratropium (DUO-NEB) 2.5 mg-0.5 mg/3 ml nebu, 3 mL by Nebulization route every six (6) hours as needed. Use  every 4 hours as needed for dyspena, Disp: 30 Nebule, Rfl: 2  ???  varicella-zoster recombinant, PF, (SHINGRIX, PF,) 50 mcg/0.5 mL susr injection, 0.5 ml IM, 1 dose now, repeat dose in 2-6 months for  Indications: PREVENTION OF HERPES ZOSTER, Disp: 0.5 mL, Rfl: 1  ???  cpap machine kit, Autoset CPAP 6-10cmH2O., Disp: , Rfl:     There are no discontinued medications.    Allergies   Allergen Reactions   ??? Cefaclor Not Reported This Time   ??? Methylprednisolone Other (comments)     Pt states that she has a reaction    ??? Prednisone Not Reported This Time       Active Problem List and Past Medical History reviewed and updated in the Electronic Health Record.      REVIEW OF SYSTEMS   Review of Systems   Constitutional: Negative for chills and fever.   Respiratory: Negative for cough.    Cardiovascular: Negative for chest pain.   Gastrointestinal: Negative for abdominal pain.   Neurological: Negative for dizziness.       PHYSICAL EXAM     Vitals:    09/06/18 1140  BP: 140/84   Pulse: 78   Resp: 16   Temp: 97.4 ??F (36.3 ??C)   TempSrc: Oral   Weight: 209 lb 3.2 oz (94.9 kg)   Height: '5\' 5"'$  (1.651 m)     Body mass index is 34.81 kg/m??.  BP Readings from Last 3 Encounters:   09/06/18 140/84   04/28/18 129/77   01/31/18 150/78       Physical Exam   Constitutional: She is oriented to person, place, and time. She appears well-developed and well-nourished.   HENT:   Mouth/Throat: Oropharynx is clear and moist.   Neck: Neck supple.   Cardiovascular: Normal heart sounds. Exam reveals no gallop and no friction rub.   No murmur heard.  Pulmonary/Chest: Breath sounds normal. She has no wheezes. She has no rales.   Breast exam:(done in the presence of spouse)  No nodules, nipple discharge or adenopathy bilaterally.    Abdominal: Bowel sounds are normal.   Neurological: She is oriented to person, place, and time.   Psychiatric: She has a normal mood and affect.   Nursing note and vitals reviewed.      LABS/IMAGING     Orders Placed This  Encounter   ??? MAM MAMMO BI SCREENING INCL CAD     Biannual mammogram screening.wishes to have it at [facility].     Standing Status:   Future     Standing Expiration Date:   09/07/2019     Order Specific Question:   Reason for Exam     Answer:   screening       Results from this visit:    No results found for any visits on 09/06/18.      Patient Instructions        Mammogram: About This Test  What is it?  A mammogram is an X-ray of the breast that is used to screen for breast cancer. This test can find tumors that are too small for you or your doctor to feel. Cancer is most easily treated and cured when it is found at an early stage.  Why is this test done?  A mammogram is done to:  ?? Look for breast cancer in women who don't have symptoms.  ?? Find breast cancer in women who have symptoms. Symptoms of breast cancer may include a lump or thickening in the breast, nipple discharge, or dimpling of the skin on one area of the breast.  ?? Find an area of suspicious breast tissue to remove for an exam under a microscope (biopsy).  How can you prepare for the test?  ?? Tell your doctor if you:  ? Are or might be pregnant.  ? Are breastfeeding.  ? Have breast implants.  ? Have previously had a breast biopsy.  ?? On the day of the test, don't use any deodorant, perfume, powders, or ointments.  What happens before the test?  ?? You will need to take off any jewelry that might interfere with the X-ray pictures.  ?? You will need to take off your clothes above the waist.  ?? You will be given a cloth or paper gown to use during the test.  What happens during the test?  ?? You usually stand during a mammogram.  ?? One at a time, your breasts will be placed on a flat plate that contains the X-ray film.  ?? Another plate is then pressed firmly against your breast to help flatten out the breast tissue. You may be asked to lift  your arm.  ?? For a few seconds while the X-ray picture is being taken, you will need to hold your breath.  ?? At least two  pictures are taken of each breast. One is taken from the top and one from the side.  What else should you know about the test?  ?? The X-ray plate will feel cold when you place your breast on it. Having your breasts flattened and squeezed isn't comfortable. But it is necessary to flatten out the breast tissue to get the best pictures.  ?? Mammograms do not prevent breast cancer or reduce a woman's risk of developing cancer.  ?? Most things that are found during a mammogram are not breast cancer.  How long does the test take?  ?? The test will take about 10 to 15 minutes. You may be in the clinic for up to an hour.  What happens after the test?  ?? You will probably be able to go home right away.  ?? You can go back to your usual activities right away.  Follow-up care is a key part of your treatment and safety. Be sure to make and go to all appointments, and call your doctor if you are having problems. It's also a good idea to keep a list of the medicines you take. Ask your doctor when you can expect to have your test results.  Where can you learn more?  Go to [employer].at.  Enter [phone] in the search box to learn more about "Mammogram: About This Test."  Current as of: October 26, 2017  Content Version: 12.2  ?? 2006-2019 Healthwise, Incorporated. Care instructions adapted under license by [employer] (which disclaims liability or warranty for this information). If you have questions about a medical condition or this instruction, always ask your healthcare professional. [place] any warranty or liability for your use of this information.              Future Appointments   Date Time Provider [place]   11/15/2018  9:00 AM Jerilee Field, MD [facility] INT MED       Jerilee Field, MD  09/06/2018

## 2018-09-06 NOTE — Progress Notes (Signed)
[facility]   [place] [address]  [phone]    CHIEF COMPLAINT     Rhonda [name] is a 72 y.o. female who presents to clinic for Follow-up.    ASSESSMENT AND PLAN     SUMMARY:     Diagnoses and all orders for this visit:    1. Screening breast examination  Manual breast exam appears normal. Her last mammogram was 2 years ago. Will update mammogram today. She agrees with the plan.-     MAM MAMMO BI SCREENING INCL CAD; Future        HPI     Rhonda [name] is here for an acute visit.   She wishes to have a breat examination as well as a mammogram.   No h/o breast cancer or abnormal mammograms in the past.   She denies any nodules, pain or lymph node swelling.         Current Outpatient Medications:   ???  lisinopril (PRINIVIL, ZESTRIL) 40 mg tablet, take 2 tablets by mouth once daily, Disp: 180 Tab, Rfl: 3  ???  ascorbic acid, vitamin C, (VITAMIN C) 250 mg tablet, Take  by mouth., Disp: , Rfl:   ???  cyanocobalamin (VITAMIN B-12) 1,000 mcg tablet, Take 1,000 mcg by mouth daily., Disp: , Rfl:   ???  hydroCHLOROthiazide (HYDRODIURIL) 25 mg tablet, take 1 tablet by mouth once daily, Disp: 90 Tab, Rfl: 1  ???  meloxicam (MOBIC) 15 mg tablet,  TAKE 1/2 TABLET BY MOUTH TWICE DAILY AS DIRECTED, Disp: 90 Tab, Rfl: 3  ???  neomycin-polymyxin-dexamethasone (DEXACINE) 3.5 mg/g-10,000 unit/g-0.1 % ophthalmic ointment, apply TO CORNER OF RIGHT EYE BEFORE BED FOR 1 WEEK, Disp: , Rfl: 0  ???  raNITIdine (ZANTAC) 300 mg tab,  TAKE 1 TABLET BY MOUTH TWICE DAILY IF NEEDED FOR GERD, Disp: 180 Tab, Rfl: 3  ???  ADVAIR DISKUS 250-50 mcg/dose diskus inhaler, inhale 1 dose by mouth twice a day, Disp: 3 Inhaler, Rfl: 3  ???  albuterol (PROVENTIL HFA) 90 mcg/actuation inhaler, Take 2 Puffs by inhalation every six (6) hours as needed for Wheezing. Inhale 2 puffs four times a day if needed, Disp: 3 Inhaler, Rfl: 2  ???  albuterol-ipratropium (DUO-NEB) 2.5 mg-0.5 mg/3 ml nebu, 3 mL by  Nebulization route every six (6) hours as needed. Use every 4 hours as needed for dyspena, Disp: 30 Nebule, Rfl: 2  ???  varicella-zoster recombinant, PF, (SHINGRIX, PF,) 50 mcg/0.5 mL susr injection, 0.5 ml IM, 1 dose now, repeat dose in 2-6 months for  Indications: PREVENTION OF HERPES ZOSTER, Disp: 0.5 mL, Rfl: 1  ???  cpap machine kit, Autoset CPAP 6-10cmH2O., Disp: , Rfl:     There are no discontinued medications.    Allergies   Allergen Reactions   ??? Cefaclor Not Reported This Time   ??? Methylprednisolone Other (comments)     Pt states that she has a reaction    ??? Prednisone Not Reported This Time       Active Problem List and Past Medical History reviewed and updated in the Electronic Health Record.      REVIEW OF SYSTEMS   Review of Systems   Constitutional: Negative for chills and fever.   Respiratory: Negative for cough.    Cardiovascular: Negative for chest pain.   Gastrointestinal: Negative for abdominal pain.   Neurological: Negative for dizziness.       PHYSICAL EXAM     Vitals:    09/06/18 1140  BP: 140/84   Pulse: 78   Resp: 16   Temp: 97.4 ??F (36.3 ??C)   TempSrc: Oral   Weight: 209 lb 3.2 oz (94.9 kg)   Height: '5\' 5"'  (1.651 m)     Body mass index is 34.81 kg/m??.  BP Readings from Last 3 Encounters:   09/06/18 140/84   04/28/18 129/77   01/31/18 150/78       Physical Exam   Constitutional: She is oriented to person, place, and time. She appears well-developed and well-nourished.   HENT:   Mouth/Throat: Oropharynx is clear and moist.   Neck: Neck supple.   Cardiovascular: Normal heart sounds. Exam reveals no gallop and no friction rub.   No murmur heard.  Pulmonary/Chest: Breath sounds normal. She has no wheezes. She has no rales.   Breast exam:(done in the presence of spouse)  No nodules, nipple discharge or adenopathy bilaterally.    Abdominal: Bowel sounds are normal.   Neurological: She is oriented to person, place, and time.   Psychiatric: She has a normal mood and affect.    Nursing note and vitals reviewed.      LABS/IMAGING     Orders Placed This Encounter   ??? MAM MAMMO BI SCREENING INCL CAD     Biannual mammogram screening.wishes to have it at [facility].     Standing Status:   Future     Standing Expiration Date:   09/07/2019     Order Specific Question:   Reason for Exam     Answer:   screening       Results from this visit:    No results found for any visits on 09/06/18.      Patient Instructions        Mammogram: About This Test  What is it?  A mammogram is an X-ray of the breast that is used to screen for breast cancer. This test can find tumors that are too small for you or your doctor to feel. Cancer is most easily treated and cured when it is found at an early stage.  Why is this test done?  A mammogram is done to:  ?? Look for breast cancer in women who don't have symptoms.  ?? Find breast cancer in women who have symptoms. Symptoms of breast cancer may include a lump or thickening in the breast, nipple discharge, or dimpling of the skin on one area of the breast.  ?? Find an area of suspicious breast tissue to remove for an exam under a microscope (biopsy).  How can you prepare for the test?  ?? Tell your doctor if you:  ? Are or might be pregnant.  ? Are breastfeeding.  ? Have breast implants.  ? Have previously had a breast biopsy.  ?? On the day of the test, don't use any deodorant, perfume, powders, or ointments.  What happens before the test?  ?? You will need to take off any jewelry that might interfere with the X-ray pictures.  ?? You will need to take off your clothes above the waist.  ?? You will be given a cloth or paper gown to use during the test.  What happens during the test?  ?? You usually stand during a mammogram.  ?? One at a time, your breasts will be placed on a flat plate that contains the X-ray film.  ?? Another plate is then pressed firmly against your breast to help flatten out the breast tissue. You may be asked to lift  your arm.   ?? For a few seconds while the X-ray picture is being taken, you will need to hold your breath.  ?? At least two pictures are taken of each breast. One is taken from the top and one from the side.  What else should you know about the test?  ?? The X-ray plate will feel cold when you place your breast on it. Having your breasts flattened and squeezed isn't comfortable. But it is necessary to flatten out the breast tissue to get the best pictures.  ?? Mammograms do not prevent breast cancer or reduce a woman's risk of developing cancer.  ?? Most things that are found during a mammogram are not breast cancer.  How long does the test take?  ?? The test will take about 10 to 15 minutes. You may be in the clinic for up to an hour.  What happens after the test?  ?? You will probably be able to go home right away.  ?? You can go back to your usual activities right away.  Follow-up care is a key part of your treatment and safety. Be sure to make and go to all appointments, and call your doctor if you are having problems. It's also a good idea to keep a list of the medicines you take. Ask your doctor when you can expect to have your test results.  Where can you learn more?  Go to [employer].at.  Enter [phone] in the search box to learn more about "Mammogram: About This Test."  Current as of: October 26, 2017  Content Version: 12.2  ?? 2006-2019 Healthwise, Incorporated. Care instructions adapted under license by [employer] (which disclaims liability or warranty for this information). If you have questions about a medical condition or this instruction, always ask your healthcare professional. [place] any warranty or liability for your use of this information.              Future Appointments   Date Time Provider [place]   11/15/2018  9:00 AM Jerilee Field, MD [facility] INT MED       Jerilee Field, MD  09/06/2018

## 2018-09-06 NOTE — Patient Instructions (Signed)
Mammogram: About This Test  What is it?  A mammogram is an X-ray of the breast that is used to screen for breast cancer. This test can find tumors that are too small for you or your doctor to feel. Cancer is most easily treated and cured when it is found at an early stage.  Why is this test done?  A mammogram is done to:  ?? Look for breast cancer in women who don't have symptoms.  ?? Find breast cancer in women who have symptoms. Symptoms of breast cancer may include a lump or thickening in the breast, nipple discharge, or dimpling of the skin on one area of the breast.  ?? Find an area of suspicious breast tissue to remove for an exam under a microscope (biopsy).  How can you prepare for the test?  ?? Tell your doctor if you:  ? Are or might be pregnant.  ? Are breastfeeding.  ? Have breast implants.  ? Have previously had a breast biopsy.  ?? On the day of the test, don't use any deodorant, perfume, powders, or ointments.  What happens before the test?  ?? You will need to take off any jewelry that might interfere with the X-ray pictures.  ?? You will need to take off your clothes above the waist.  ?? You will be given a cloth or paper gown to use during the test.  What happens during the test?  ?? You usually stand during a mammogram.  ?? One at a time, your breasts will be placed on a flat plate that contains the X-ray film.  ?? Another plate is then pressed firmly against your breast to help flatten out the breast tissue. You may be asked to lift your arm.  ?? For a few seconds while the X-ray picture is being taken, you will need to hold your breath.  ?? At least two pictures are taken of each breast. One is taken from the top and one from the side.  What else should you know about the test?  ?? The X-ray plate will feel cold when you place your breast on it. Having your breasts flattened and squeezed isn't comfortable. But it is necessary to flatten out the breast tissue to get the best pictures.   ?? Mammograms do not prevent breast cancer or reduce a woman's risk of developing cancer.  ?? Most things that are found during a mammogram are not breast cancer.  How long does the test take?  ?? The test will take about 10 to 15 minutes. You may be in the clinic for up to an hour.  What happens after the test?  ?? You will probably be able to go home right away.  ?? You can go back to your usual activities right away.  Follow-up care is a key part of your treatment and safety. Be sure to make and go to all appointments, and call your doctor if you are having problems. It's also a good idea to keep a list of the medicines you take. Ask your doctor when you can expect to have your test results.  Where can you learn more?  Go to http://www.healthwise.net/GoodHelpConnections.  Enter Z238 in the search box to learn more about "Mammogram: About This Test."  Current as of: October 26, 2017  Content Version: 12.2  ?? 2006-2019 Healthwise, Incorporated. Care instructions adapted under license by [employer] (which disclaims liability or warranty for this information). If you have questions about a   medical condition or this instruction, always ask your healthcare professional. [employer], Inc[employer] disclaims any warranty or liability for your use of this information.

## 2018-09-07 ENCOUNTER — Encounter

## 2018-09-13 ENCOUNTER — Encounter (INDEPENDENT_AMBULATORY_CARE_PROVIDER_SITE_OTHER): Payer: Self-pay | Admitting: Family

## 2018-09-13 ENCOUNTER — Encounter (INDEPENDENT_AMBULATORY_CARE_PROVIDER_SITE_OTHER): Payer: Self-pay

## 2018-09-13 ENCOUNTER — Ambulatory Visit (INDEPENDENT_AMBULATORY_CARE_PROVIDER_SITE_OTHER): Admitting: Family

## 2018-09-13 VITALS — BP 120/64 | HR 102 | Temp 98.6°F | Resp 14 | Ht 62.5 in | Wt 136.6 lb

## 2018-09-13 DIAGNOSIS — N390 Urinary tract infection, site not specified: Principal | ICD-10-CM

## 2018-09-13 MED ORDER — FLUTICASONE PROPIONATE 50 MCG/ACT NA SUSP
NASAL | Status: DC
Start: ? — End: 2019-02-05

## 2018-09-13 MED ORDER — SIMVASTATIN 20 MG OR TABS
ORAL_TABLET | ORAL | Status: DC
Start: ? — End: 2019-01-11

## 2018-09-13 MED ORDER — CLONAZEPAM 1 MG OR TABS
ORAL_TABLET | ORAL | Status: DC
Start: ? — End: 2019-02-05

## 2018-09-13 MED ORDER — CEPHALEXIN 500 MG OR CAPS
500.00 mg | ORAL_CAPSULE | Freq: Two times a day (BID) | ORAL | 0 refills | Status: AC
Start: 2018-09-13 — End: 2018-09-20

## 2018-09-14 LAB — URINE CULTURE

## 2018-09-18 ENCOUNTER — Telehealth (INDEPENDENT_AMBULATORY_CARE_PROVIDER_SITE_OTHER): Payer: Self-pay | Admitting: Family

## 2018-10-14 ENCOUNTER — Encounter

## 2018-10-16 MED ORDER — HYDROCHLOROTHIAZIDE 25 MG TAB
25 mg | ORAL_TABLET | ORAL | 0 refills | Status: DC
Start: 2018-10-16 — End: 2018-10-18

## 2018-10-18 ENCOUNTER — Encounter

## 2018-10-18 NOTE — Telephone Encounter (Signed)
Dr Glade LloydPandey please advise. Pt is overdue on her labs by 1year.

## 2018-10-18 NOTE — Telephone Encounter (Signed)
Rhonda AsaBarbara J [name]  10/25/1946      Call back needed: yes    Preferred call back number: Call preference: Home phone   [phone] (home)    No relevant phone numbers on file.        Medications Requested:  Requested Prescriptions     Pending Prescriptions Disp Refills   ??? hydroCHLOROthiazide (HYDRODIURIL) 25 mg tablet [Pharmacy Med Name: HYDROCHLOROTHIAZIDE 25MG  TABLETS] 90 Tab 0     Sig: TAKE 1 TABLET BY MOUTH ONCE DAILY             Preferred Pharmacy:   [id number] - [place], [place] - [address]  [address]  [place] [id number]  Phone: [phone] Fax: [phone]                                       Last visit with provider:  09/06/2018    Future Appointments   Date Time Provider Department Center   11/02/2018  3:00 PM [facility] RM 2 [facility]   11/15/2018  9:00 AM Harrington ChallengerPandey, Sanjay P, MD [facility] INT MED       MOST RECENT BLOOD PRESSURES  BP Readings from Last 3 Encounters:   09/06/18 140/84   04/28/18 129/77   01/31/18 150/78        MOST RECENT LAB DATA  Lab Results   Component Value Date/Time    Creatinine 0.98 05/13/2017 07:40 AM    Potassium 4.0 05/13/2017 07:40 AM    ALT (SGPT) 32 05/13/2017 07:40 AM    Cholesterol, total 182 05/13/2017 07:40 AM    HGB 13.5 05/13/2017 07:40 AM    HCT 39.6 05/13/2017 07:40 AM

## 2018-10-19 MED ORDER — HYDROCHLOROTHIAZIDE 25 MG TAB
25 mg | ORAL_TABLET | ORAL | 0 refills | Status: DC
Start: 2018-10-19 — End: 2019-01-12

## 2018-10-19 NOTE — Telephone Encounter (Signed)
Didn't receive the request from pharmacy until this morning.

## 2018-10-19 NOTE — Telephone Encounter (Signed)
-----   Message from L-3 CommunicationsBarbara J. Raver sent at 10/19/2018  6:27 AM EST -----  Regarding: Prescription Question  Contact: [phone]  I received a notice that my prescription refill for b.p. medicine had been sent to the pharmacey [place] [place]  I went yesterday to pick it up and they say they never received it.  Today I will be out of my  med. (hydro.....).  Not sure what happened but would you please check this out?  Thanks, Britta MccreedyBarbara

## 2018-10-23 MED ORDER — WIXELA INHUB 250 MCG-50 MCG/DOSE POWDER FOR INHALATION
250-50 mcg/dose | RESPIRATORY_TRACT | 0 refills | Status: DC
Start: 2018-10-23 — End: 2019-01-15

## 2018-10-23 NOTE — Telephone Encounter (Signed)
Last appt @ PCP Office:  Future Appointments   Date Time Provider Department Center   11/02/2018  3:00 PM [facility] RM 2 [facility]   11/15/2018  9:00 AM Harrington ChallengerPandey, Sanjay P, MD [facility] INT MED       MOST RECENT BLOOD PRESSURES  BP Readings from Last 3 Encounters:   09/06/18 140/84   04/28/18 129/77   01/31/18 150/78        MOST RECENT LAB DATA  Lab Results   Component Value Date/Time    Creatinine 0.98 05/13/2017 07:40 AM    Potassium 4.0 05/13/2017 07:40 AM    ALT (SGPT) 32 05/13/2017 07:40 AM    Cholesterol, total 182 05/13/2017 07:40 AM    HGB 13.5 05/13/2017 07:40 AM    HCT 39.6 05/13/2017 07:40 AM

## 2018-11-02 ENCOUNTER — Inpatient Hospital Stay: Admit: 2018-11-02 | Payer: MEDICARE | Attending: Internal Medicine | Primary: Internal Medicine

## 2018-11-02 DIAGNOSIS — Z1231 Encounter for screening mammogram for malignant neoplasm of breast: Secondary | ICD-10-CM

## 2018-11-15 ENCOUNTER — Ambulatory Visit: Attending: Internal Medicine | Primary: Internal Medicine

## 2018-11-15 ENCOUNTER — Ambulatory Visit: Admit: 2018-11-15 | Discharge: 2018-11-15 | Payer: MEDICARE | Attending: Internal Medicine | Primary: Internal Medicine

## 2018-11-15 ENCOUNTER — Encounter

## 2018-11-15 DIAGNOSIS — I10 Essential (primary) hypertension: Secondary | ICD-10-CM

## 2018-11-15 DIAGNOSIS — Z0000 Encounter for general adult medical examination without abnormal findings: Secondary | ICD-10-CM

## 2018-11-15 IMAGING — MG MAMMO SCRN BIL W/CAD TOMO
8 series · 8 of 24 positions shown · non-contrast
Comparison: none

Images Obtained from [facility]
HISTORY: Patient is 72 years old and is seen for screening. The patient has no personal history of cancer. The patient has no family history of breast cancer.
FILMS COMPARED:
The present examination has been compared to prior imaging studies.
TECHNIQUE: Bilateral 2-D digital screening mammogram was performed followed by 3-D tomosynthesis.  Current study was also evaluated with a computer aided detection (CAD) system.
MAMMOGRAM FINDINGS:
There are scattered areas of fibroglandular density.
There are no significant changes from the prior study.
No suspicious abnormality is seen in either breast.

[L CC]
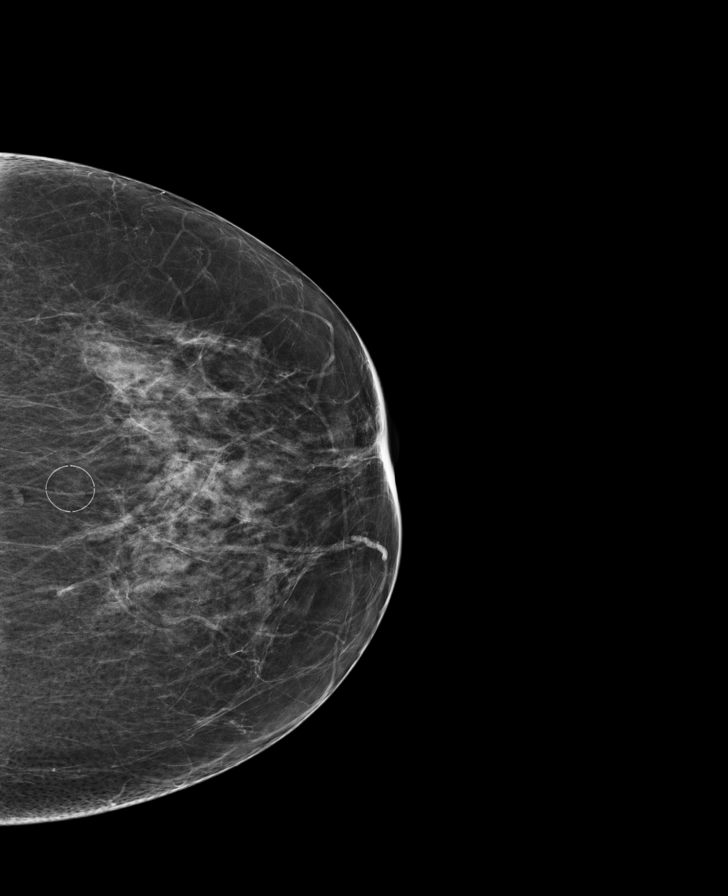

[L MLO]
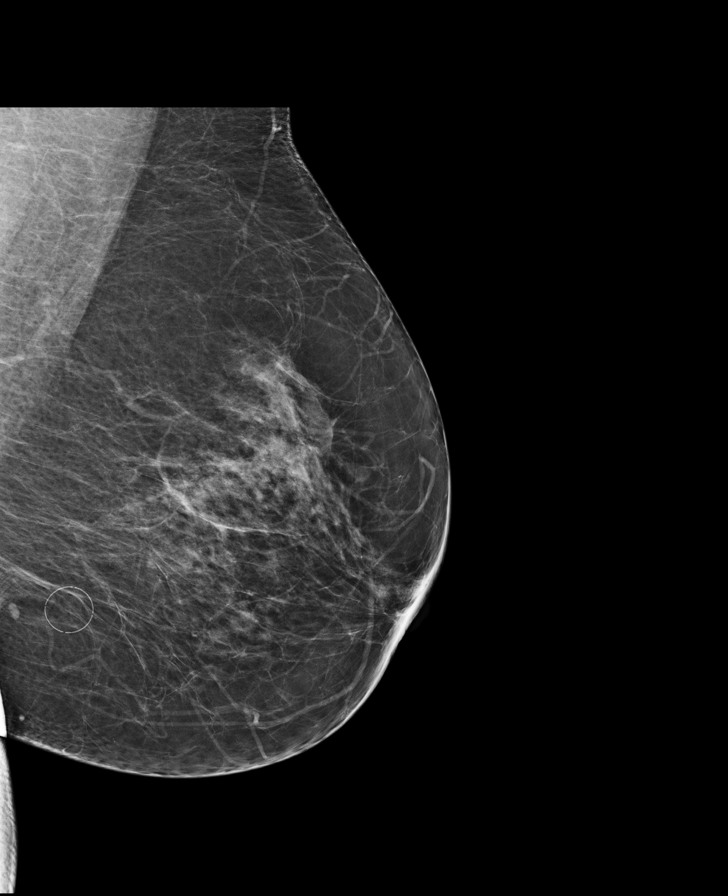

[R CC]
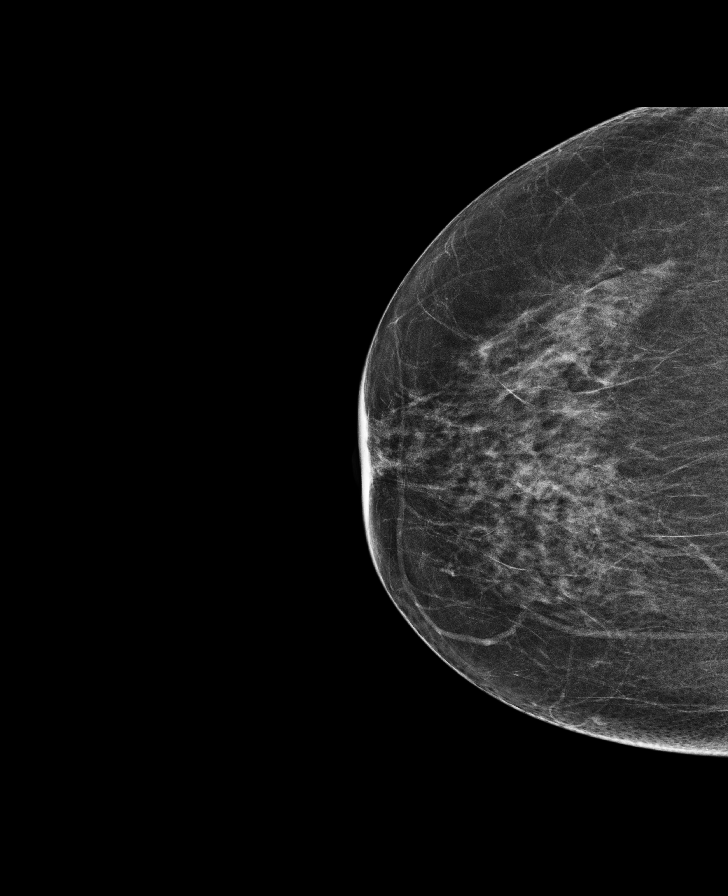

[R MLO]
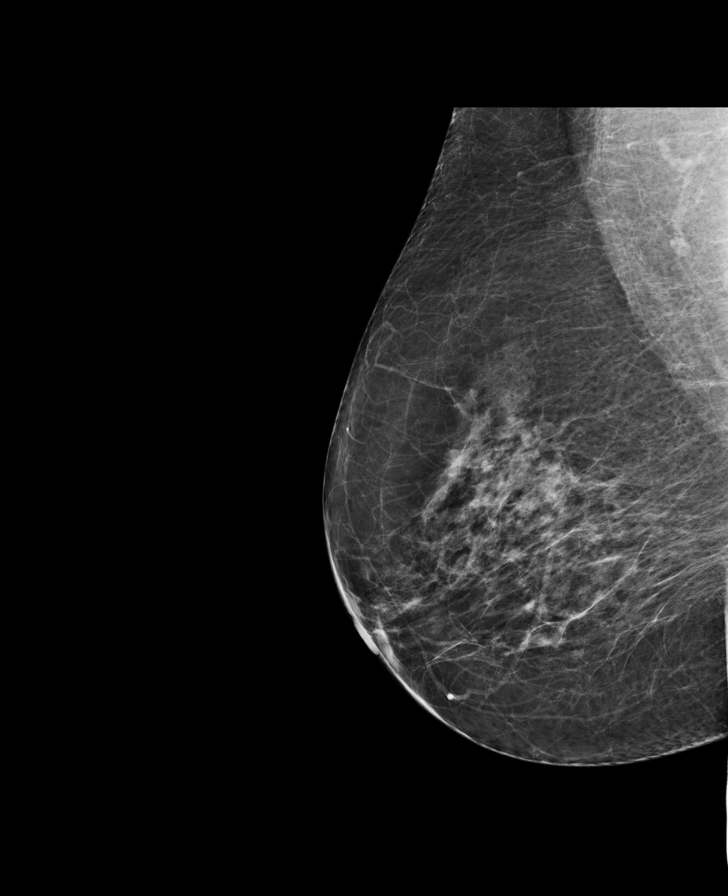

[R CC tomo · tomo slice 33/64.0]
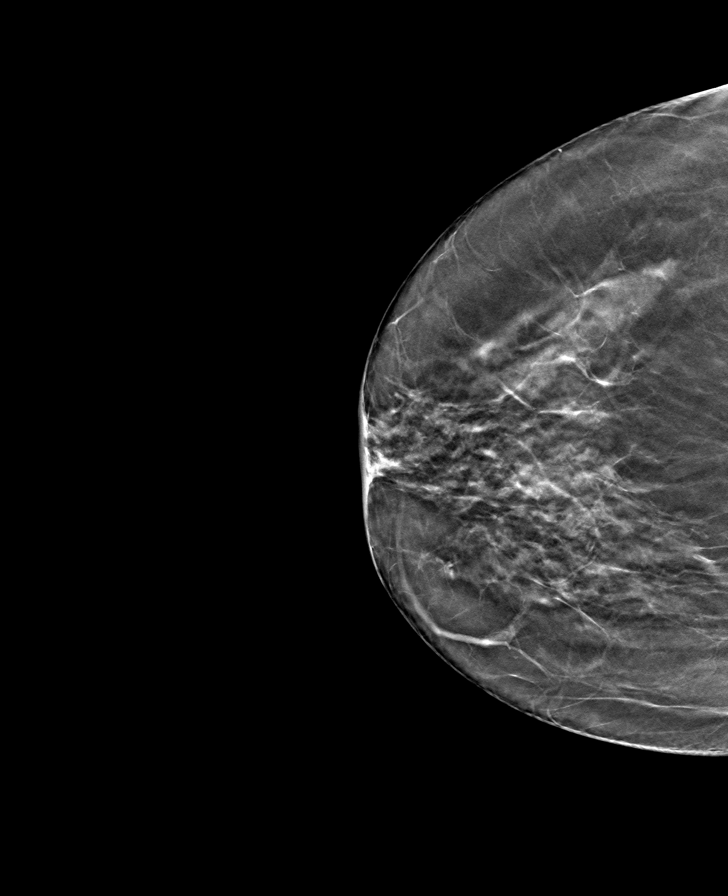

[R MLO tomo · tomo slice 38/75.0]
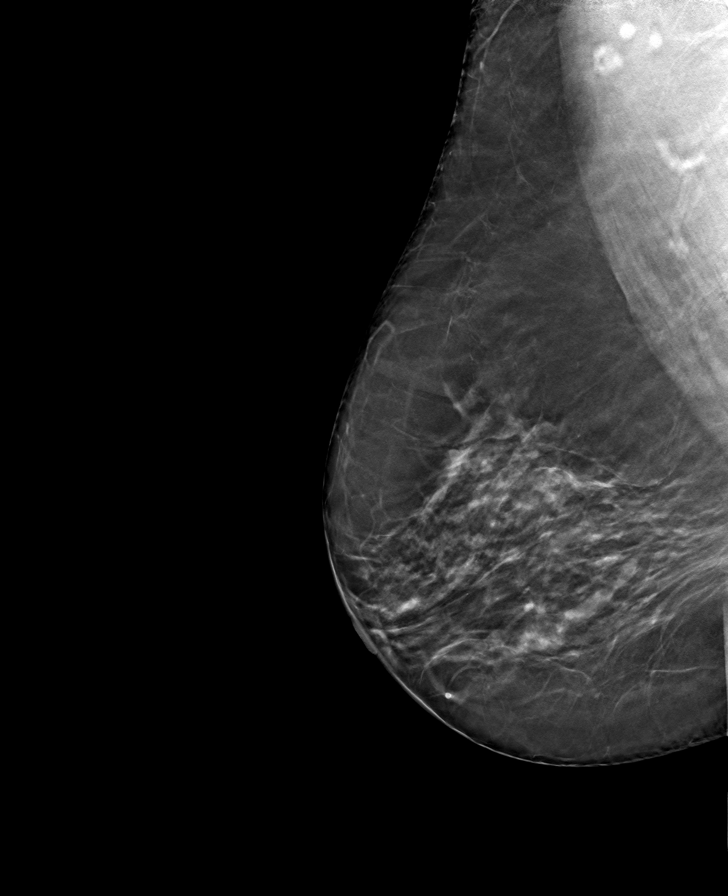

[L CC tomo · tomo slice 31/62.0]
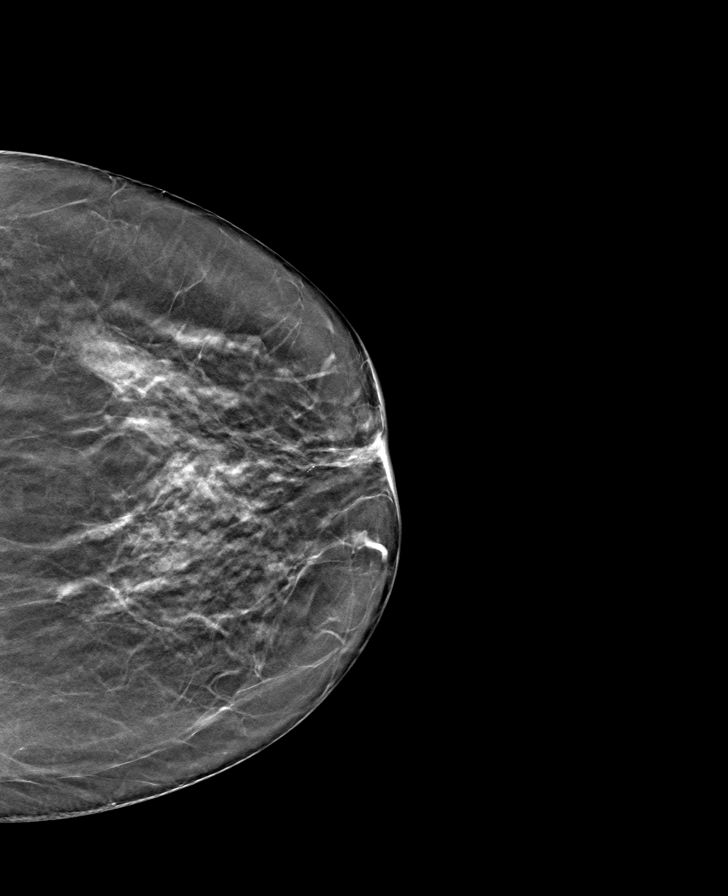

[L MLO tomo · tomo slice 36/71.0]
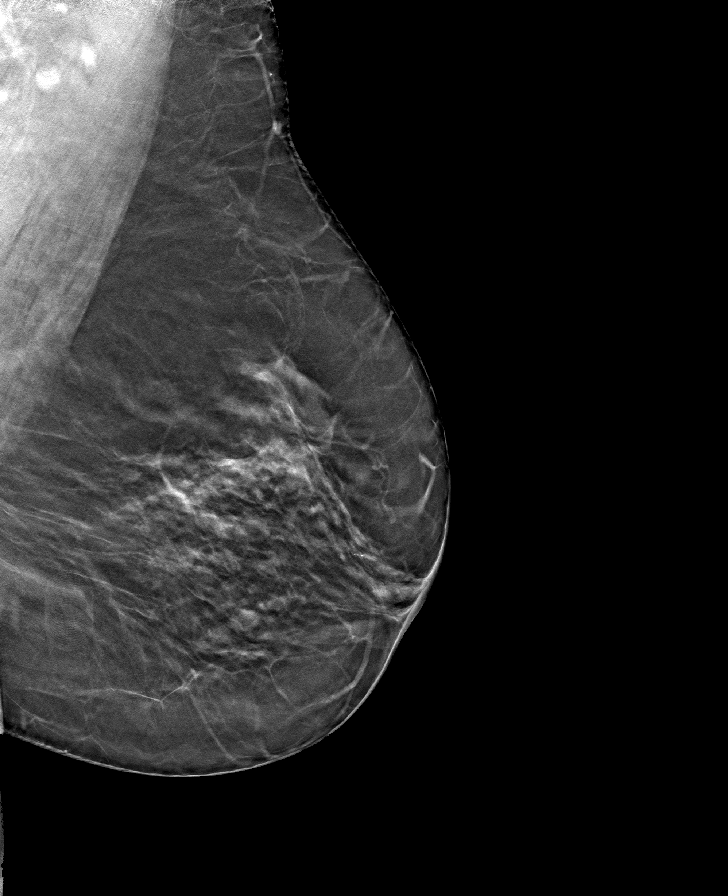

[8 of 24 positions shown; findings below may reference images not displayed]

IMPRESSION: There is no mammographic evidence of malignancy.
Screening mammogram recommended in 1 year.
BI-RADS Category 1: Negative

## 2018-11-15 NOTE — Progress Notes (Signed)
[facility]   [place] [address]  [phone]     [place] SUBSEQUENT        CHIEF COMPLAINT    Rhonda [name] is a 73 y.o. female who presents to the office today for Annual Wellness Visit; Hypertension; Sleep Apnea; Asthma; and GERD.     HISTORY OF PRESENT ILLNESS       Rhonda [name] is here for [facility] and follow up on OSA, GERD, Asthma, HTN   She has lost 20 pounds over the past 6 months with dietary changes.  BP is well controlled. Denies cough, SOB, dizziness or palpitations.   Asthma is well controlled with duoneb.  GERD symptoms are well controlled with ranitidine.   Colonoscopy uptodate.   Bowel habits are normal and regular.  Vision test is uptodate.   hearing is normal.       MEDICATIONS      Current Outpatient Medications   Medication Sig   ??? WIXELA INHUB 250-50 mcg/dose diskus inhaler INHALE 1 DOSE BY MOUTH TWICE A DAY   ??? hydroCHLOROthiazide (HYDRODIURIL) 25 mg tablet TAKE 1 TABLET BY MOUTH ONCE DAILY   ??? lisinopril (PRINIVIL, ZESTRIL) 40 mg tablet take 2 tablets by mouth once daily   ??? ascorbic acid, vitamin C, (VITAMIN C) 250 mg tablet Take  by mouth.   ??? cyanocobalamin (VITAMIN B-12) 1,000 mcg tablet Take 1,000 mcg by mouth daily.   ??? meloxicam (MOBIC) 15 mg tablet  TAKE 1/2 TABLET BY MOUTH TWICE DAILY AS DIRECTED (Patient taking differently: 15 mg daily.  TAKE 1/2 TABLET BY MOUTH TWICE DAILY AS DIRECTED)   ??? raNITIdine (ZANTAC) 300 mg tab  TAKE 1 TABLET BY MOUTH TWICE DAILY IF NEEDED FOR GERD (Patient taking differently: daily.)   ??? albuterol (PROVENTIL HFA) 90 mcg/actuation inhaler Take 2 Puffs by inhalation every six (6) hours as needed for Wheezing. Inhale 2 puffs four times a day if needed   ??? albuterol-ipratropium (DUO-NEB) 2.5 mg-0.5 mg/3 ml nebu 3 mL by Nebulization route every six (6) hours as needed. Use every 4 hours as needed for dyspena   ??? cpap machine kit Autoset CPAP 6-10cmH2O.     No current facility-administered medications for this  visit.      Medications Discontinued During This Encounter   Medication Reason   ??? varicella-zoster recombinant, PF, (SHINGRIX, PF,) 50 mcg/0.5 mL susr injection Therapy Completed   ??? neomycin-polymyxin-dexamethasone (DEXACINE) 3.5 mg/g-10,000 unit/g-0.1 % ophthalmic ointment Therapy Completed        ALLERGIES      Allergies   Allergen Reactions   ??? Cefaclor Not Reported This Time   ??? Methylprednisolone Other (comments)     Pt states that she has a reaction    ??? Prednisone Not Reported This Time        REVIEW OF SYSTEMS       Review of Systems   Constitutional: Negative for chills and fever.   HENT: Negative for congestion, ear discharge and sore throat.    Eyes: Negative for blurred vision.   Respiratory: Negative for cough, shortness of breath and wheezing.    Cardiovascular: Negative for chest pain and palpitations.   Gastrointestinal: Negative for abdominal pain, nausea and vomiting.   Genitourinary: Negative for dysuria.   Musculoskeletal: Negative for back pain.   Neurological: Negative for dizziness, loss of consciousness and headaches.   Endo/Heme/Allergies: Does not bruise/bleed easily.   Psychiatric/Behavioral: The patient is not nervous/anxious.  ACTIVE MEDICAL PROBLEMS      Patient Active Problem List   Diagnosis Code   ??? Moderate persistent asthma J45.40   ??? Basal cell carcinoma of skin of right upper limb, including shoulder C44.612   ??? Hypertension, benign I10   ??? Lumbar spondylosis M47.816   ??? Osteoarthritis M19.90   ??? Other screening mammogram Z12.31   ??? Preventative health care Z00.00   ??? Rotator cuff tear, left M75.102   ??? Seborrheic keratosis L82.1   ??? Skin lesion L98.9   ??? Obstructive sleep apnea syndrome G47.33   ??? GERD without esophagitis K21.9   ??? Severe obesity with body mass index (BMI) of 35.0 to 39.9 with serious comorbidity (HCC) E66.01   ??? Screening breast examination Z12.39        PAST MEDICAL HISTORY      Past Medical History:   Diagnosis Date   ??? Asthma    ??? HTN  (hypertension)    ??? Hx of colonoscopy 2005        PAST SURGICAL HISTORY      Past Surgical History:   Procedure Laterality Date   ??? HX CATARACT REMOVAL Left    ??? HX ROTATOR CUFF REPAIR  2007    Dr. Mickie Bail - [place]   ??? [place] History     Patient does not qualify to have social determinant information on file (likely too young).   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 No Known Problems Son    ??? No Known Problems Daughter         VITALS      Vitals:    11/15/18 0857   BP: 121/68   Pulse: 69   Resp: 16   Temp: 98.1 ??F (36.7 ??C)   TempSrc: Oral   Weight: 192 lb 12.8 oz (87.5 kg)   Height: $Remove'5\' 5"'fwwBKSE$  (1.651 m)     Body mass index is 32.08 kg/m??.     BP Readings from Last 3 Encounters:   11/15/18 121/68   09/06/18 140/84   04/28/18 129/77     Wt Readings from Last 3 Encounters:   11/15/18 192 lb 12.8 oz (87.5 kg)   09/06/18 209 lb 3.2 oz (94.9 kg)   04/28/18 217 lb (98.4 kg)        PHYSICAL EXAM         Physical Exam  Vitals signs and nursing note reviewed.   Constitutional:       Appearance: Normal appearance.   HENT:      Mouth/Throat:      Pharynx: Oropharynx is clear.   Eyes:      Conjunctiva/sclera: Conjunctivae normal.   Neck:      Musculoskeletal: Neck supple.   Cardiovascular:      Rate and Rhythm: Normal rate and regular rhythm.      Heart sounds: No murmur. No friction rub. No gallop.    Pulmonary:      Breath sounds: Normal breath sounds. No wheezing or rhonchi.   Abdominal:  General: Bowel sounds are normal.      Palpations: Abdomen is soft.   Neurological:      General: No focal deficit present.      Mental Status: She is alert and oriented to person, place, and time.   Psychiatric:         Mood and Affect: Mood  normal.         Behavior: Behavior normal.          ORDERS & MEDS (NEW)      Orders Placed This Encounter   ??? CBC WITH AUTOMATED DIFF     Standing Status:   Future     Standing Expiration Date:   11/13/1094   ??? METABOLIC PANEL, COMPREHENSIVE     Standing Status:   Future     Standing Expiration Date:   11/16/2019   ??? LIPID PANEL W/ REFLX DIRECT LDL     Standing Status:   Future     Standing Expiration Date:   11/16/2019        WELLNESS EVALUATION / HEALTH RISK ASSESSMENT       ADVANCE DIRECTIVE  Do you have an Advance Directive?   Yes     Advanced care planning directives were discussed with the patient and /or family/caregiver.        ALCOHOL & SMOKING HISTORY:  She  reports that she has never smoked. She has never used smokeless tobacco. She reports current alcohol use. She reports that she does not use drugs.     CAGE-AID  No flowsheet data found.     In the past year, have you used street drugs or medications not prescribed to you?  No                                                                                                    OPIOID USE RISK ASSESSMENT  Is the patient currently taking a prescribed opiate pain medication?   NO                                                                           FALL RISK  Fall Risk Assessment, last 12 mths 04/28/2018   Able to walk? Yes   Fall in past 12 months? No     Do you have difficulty with balance or walking?  No     Do you have a serious difficulty hearing?  No        DEPRESSION SCREENING  Severity:  0-4 none; 5-9 mild; 10-14 moderate; 15-19 moderately severe; 20-27 severe  3 most recent PHQ Screens 04/28/2018   Little interest or pleasure in doing things Not at all   Feeling down, depressed, irritable, or hopeless Not at all   Total Score PHQ 2 0        In  the past 2 weeks, how often have you felt nervous, anxious, or on edge?  Almost never     Do you or a family member have any concern about your memory?   No     In general, would you say your health is...     Good        Do you worry whether your food will run out before you have money to buy more?   No     In the past 7 days, did you need help from others to perform everyday activities such as eating, getting dressed, grooming, bathing, walking, or using the toilet?  No     In the past 7 days, did you need help from others to take care of such things like laundry, housekeeping, banking, shopping, using the telephone, food preparation, transportation or taking your own medicine?  No     How many days a week do you do physical activity such as walking, swimming, yoga, or other exercise?  2 days per week.              On those days, how many minutes are you usually active?  120 minutes per day.     SCREENING  QUESTIONNAIRES       STOP-BANG (3+ high risk)        MEMORY EVALUATION (if needed)  No flowsheet data found.     VISION (for Welcome to Medicare Visit)  No exam data present     [place] TEAM  Patient Care Team:  Jerilee Field, MD as PCP - General (Internal Medicine)     IMMUNIZATIONS  Immunization History   Administered Date(s) Administered   ??? Influenza High Dose Vaccine PF 08/12/2016, 08/13/2016   ??? Influenza Vaccine 08/09/2015, 08/17/2017   ??? Pneumococcal Conjugate (PCV-13) 04/23/2014   ??? Pneumococcal Polysaccharide (PPSV-23) 10/03/2015   ??? Tdap 10/03/2015     COLONOSCOPY (most recent)  Colonoscopy, External   Date Value Ref Range Status   08/23/2016 there were findings  Final        Health Maintenance Topics with due status: Overdue       Topic Date Due    Shingrix Vaccine Age 42> 01/11/1997    GLAUCOMA SCREENING Q2Y 01/12/2012    MEDICARE YEARLY EXAM 01/31/2017    Influenza Age 41 to Adult 06/08/2018     Health Maintenance Topics with due status: Not Due       Topic Last Completion Date    DTaP/Tdap/Td series 10/03/2015    COLONOSCOPY 08/23/2016    BREAST CANCER SCRN MAMMOGRAM 11/02/2018     Health Maintenance Topics with due status: Completed       Topic Last Completion Date    Bone  Densitometry (Dexa) Screening 12/23/2014    Pneumococcal 65+ years 10/03/2015    Hepatitis C Screening 10/03/2017           EDUCATION & COUNSELING PROVIDED (based on today's review & evaluation)  ??? Healthy diet, maintaining a healthy weight, and getting at least 30 minutes of exercise most every day.    ??? End-of-Life planning.    ??? Immunizations:   ??? Pneumococcal Vaccine:    ??? If <65:  (PPSV23 (Pneumovax) once for high risk patients - Smokers, Alcoholics, Chronic Lung, Heart, or Liver Dz)  ??? Up to date as of 11/15/2018   ??? If >64:  PCV13 (Prevnar) at 68 followed 1 year later by PPSV23 (Pneumovax)  ???  Received both PCV13 & PPSV23   ??? Influenza Vaccine: Discussed annual vaccination.    ??? Shingles Vaccine:  (Shingrix for those over 50, 2 shots 2-6 months apart)  ??? Zostavax:  uptodate in the past.    ??? Shingrix:  Received both doses   ??? Tdap / Td Booster (every 10 yrs)  ??? Last: 10/03/2015  ??? Action:   Up to date as of 11/15/2018    ??? Colon CA Screening:    ??? Colonoscopy (Q10Y), FIT test (Q1Y), or Cologuard (Q3Y) from 50-75 with consideration of ongoing screening from 76-85.    ??? Last colonoscopy:  colonoscopy normal on 08/23/2016  ??? Next due:  10 years  ??? Action:  Up to date as of 11/15/18  ??? Mammography (every 1-2 years, 76-75 y.o)    Up to date as of 11/15/18  ??? Cervical CA Screening (stop at 72 y.o.)  ??? Not indicated  ??? Bone Density testing (DEXA):  (if over 65)    ??? Not indicated  ??? Abdominal aortic aneurysm screening:  (if over 23 with a FH of AAA)   ??         Not indicated   ??? Lung CA screening with Low dose CT:    ??? (75-77 y.o., 77 PYH, and currently smoking or quit less than 15 years ago, Annually if at risk).    ??? Not indicated  ??? Hepatitis C screening: (if born between [birth year] through 1965).     ??? Not indicated  ??? Cholesterol screening:  (every 5 years after age 73)   ??? Diabetes screening: (40 to 70 who are overweight or obese)   ??? Sunscreen use and sun exposure recommendations reviewed.    ??? Aspirin therapy reviewed  including risks and benefits.  ??? Reviewed safe amounts of alcohol use (No more than 1 drink per day).    ??? All healthcare maintenance items due as above.      Medicare Screening Schedule Reviewed and Handout with detailed personalized recommendations was given to the patient.     ASSESSMENT AND PLAN       Diagnoses and all orders for this visit:    1. Medicare annual wellness visit, subsequent  Vaccinations uptodate.     2. Hypertension, benign  BP stable. Continue current medication. -     CBC WITH AUTOMATED DIFF; Future  -     METABOLIC PANEL, COMPREHENSIVE; Future    3. GERD without esophagitis  Stable on ranitidine    4. Moderate persistent asthma without complication  Stable on duoneb as needed.    5. Obstructive sleep apnea syndrome  Compliant with CPAP.    6. Screening breast examination  Mammogram uptodate    7. Preventative health care  Colnooscopy uptodate on 08/23/2016, next in 10 years.  8. Severe obesity with body mass index (BMI) of 35.0 to 39.9 with serious comorbidity (HCC)  -     LIPID PANEL W/ REFLX DIRECT LDL; Future           COGNITIVE ASSESSMENT  Based on my evaluation of the patient and the Health Risk Assessment performed today, there isn't evidence of cognitive impairment.     Follow-up and Dispositions    ?? Return in about 6 months (around 05/16/2019) for OV40.          Future Appointments   Date Time Provider [place]   05/22/2019  9:40 AM Jerilee Field, MD [facility]  Jerilee Field, MD  11/15/2018

## 2018-11-15 NOTE — Patient Instructions (Signed)
Well Visit, Over 65: Care Instructions  Your Care Instructions    Physical exams can help you stay healthy. Your doctor has checked your overall health and may have suggested ways to take good care of yourself. He or she also may have recommended tests. At home, you can help prevent illness with healthy eating, regular exercise, and other steps.  Follow-up care is a key part of your treatment and safety. Be sure to make and go to all appointments, and call your doctor if you are having problems. It's also a good idea to know your test results and keep a list of the medicines you take.  How can you care for yourself at home?  ?? Reach and stay at a healthy weight. This will lower your risk for many problems, such as obesity, diabetes, heart disease, and high blood pressure.  ?? Get at least 30 minutes of exercise on most days of the week. Walking is a good choice. You also may want to do other activities, such as running, swimming, cycling, or playing tennis or team sports.  ?? Do not smoke. Smoking can make health problems worse. If you need help quitting, talk to your doctor about stop-smoking programs and medicines. These can increase your chances of quitting for good.  ?? Protect your skin from too much sun. When you're outdoors from 10 a.m. to 4 p.m., stay in the shade or cover up with clothing and a hat with a wide brim. Wear sunglasses that block UV rays. Even when it's cloudy, put broad-spectrum sunscreen (SPF 30 or higher) on any exposed skin.  ?? See a dentist one or two times a year for checkups and to have your teeth cleaned.  ?? Wear a seat belt in the car.  Follow your doctor's advice about when to have certain tests. These tests can spot problems early.  For men and women  ?? Cholesterol. Your doctor will tell you how often to have this done based on your overall health and other things that can increase your risk for heart attack and stroke.   ?? Blood pressure. Have your blood pressure checked during a routine doctor visit. Your doctor will tell you how often to check your blood pressure based on your age, your blood pressure results, and other factors.  ?? Diabetes. Ask your doctor whether you should have tests for diabetes.  ?? Vision. Experts recommend that you have yearly exams for glaucoma and other age-related eye problems.  ?? Hearing. Tell your doctor if you notice any change in your hearing. You can have tests to find out how well you hear.  ?? Colon cancer tests. Keep having colon cancer tests as your doctor recommends. You can have one of several types of tests.  ?? Heart attack and stroke risk. At least every 4 to 6 years, you should have your risk for heart attack and stroke assessed. Your doctor uses factors such as your age, blood pressure, cholesterol, and whether you smoke or have diabetes to show what your risk for a heart attack or stroke is over the next 10 years.  ?? Osteoporosis. Talk to your doctor about whether you should have a bone density test to find out whether you have thinning bones. Also ask your doctor about whether you should take calcium and vitamin D supplements.  For women  ?? Pap test and pelvic exam. You may no longer need a Pap test. Talk with your doctor about whether to stop or continue to have   Pap tests.  ?? Breast exam and mammogram. Ask how often you should have a mammogram, which is an X-ray of your breasts. A mammogram can spot breast cancer before it can be felt and when it is easiest to treat.  ?? Thyroid disease. Talk to your doctor about whether to have your thyroid checked as part of a regular physical exam. Women have an increased chance of a thyroid problem.  For men  ?? Prostate exam. Talk to your doctor about whether you should have a blood test (called a PSA test) for prostate cancer. Experts recommend that you discuss the benefits and risks of the test with your doctor before you  decide whether to have this test. Some experts say that men ages 70 and older no longer need testing.  ?? Abdominal aortic aneurysm. Ask your doctor whether you should have a test to check for an aneurysm. You may need a test if you ever smoked or if your parent, brother, sister, or child has had an aneurysm.  When should you call for help?  Watch closely for changes in your health, and be sure to contact your doctor if you have any problems or symptoms that concern you.  Where can you learn more?  Go to http://www.healthwise.net/[employer].  Enter K859 in the search box to learn more about "Well Visit, Over 65: Care Instructions."  Current as of: October 20, 2017  Content Version: 12.2  ?? 2006-2019 [employer]. Care instructions adapted under license by [employer] (which disclaims liability or warranty for this information). If you have questions about a medical condition or this instruction, always ask your healthcare professional. Healthwise, Inc[employer] disclaims any warranty or liability for your use of this information.

## 2018-11-15 NOTE — Progress Notes (Signed)
[facility]   [place] [address]  [phone]     [place] SUBSEQUENT        CHIEF COMPLAINT    Rhonda [name] is a 73 y.o. female who presents to the office today for Annual Wellness Visit; Hypertension; Sleep Apnea; Asthma; and GERD.     HISTORY OF PRESENT ILLNESS       Rhonda [name] is here for [facility] and follow up on OSA, GERD, Asthma, HTN   She has lost 20 pounds over the past 6 months with dietary changes.  BP is well controlled. Denies cough, SOB, dizziness or palpitations.   Asthma is well controlled with duoneb.  GERD symptoms are well controlled with ranitidine.   Colonoscopy uptodate.   Bowel habits are normal and regular.  Vision test is uptodate.   hearing is normal.       MEDICATIONS      Current Outpatient Medications   Medication Sig   ??? WIXELA INHUB 250-50 mcg/dose diskus inhaler INHALE 1 DOSE BY MOUTH TWICE A DAY   ??? hydroCHLOROthiazide (HYDRODIURIL) 25 mg tablet TAKE 1 TABLET BY MOUTH ONCE DAILY   ??? lisinopril (PRINIVIL, ZESTRIL) 40 mg tablet take 2 tablets by mouth once daily   ??? ascorbic acid, vitamin C, (VITAMIN C) 250 mg tablet Take  by mouth.   ??? cyanocobalamin (VITAMIN B-12) 1,000 mcg tablet Take 1,000 mcg by mouth daily.   ??? meloxicam (MOBIC) 15 mg tablet  TAKE 1/2 TABLET BY MOUTH TWICE DAILY AS DIRECTED (Patient taking differently: 15 mg daily.  TAKE 1/2 TABLET BY MOUTH TWICE DAILY AS DIRECTED)   ??? raNITIdine (ZANTAC) 300 mg tab  TAKE 1 TABLET BY MOUTH TWICE DAILY IF NEEDED FOR GERD (Patient taking differently: daily.)   ??? albuterol (PROVENTIL HFA) 90 mcg/actuation inhaler Take 2 Puffs by inhalation every six (6) hours as needed for Wheezing. Inhale 2 puffs four times a day if needed   ??? albuterol-ipratropium (DUO-NEB) 2.5 mg-0.5 mg/3 ml nebu 3 mL by Nebulization route every six (6) hours as needed. Use every 4 hours as needed for dyspena   ??? cpap machine kit Autoset CPAP 6-10cmH2O.      No current facility-administered medications for this visit.      Medications Discontinued During This Encounter   Medication Reason   ??? varicella-zoster recombinant, PF, (SHINGRIX, PF,) 50 mcg/0.5 mL susr injection Therapy Completed   ??? neomycin-polymyxin-dexamethasone (DEXACINE) 3.5 mg/g-10,000 unit/g-0.1 % ophthalmic ointment Therapy Completed        ALLERGIES      Allergies   Allergen Reactions   ??? Cefaclor Not Reported This Time   ??? Methylprednisolone Other (comments)     Pt states that she has a reaction    ??? Prednisone Not Reported This Time        REVIEW OF SYSTEMS       Review of Systems   Constitutional: Negative for chills and fever.   HENT: Negative for congestion, ear discharge and sore throat.    Eyes: Negative for blurred vision.   Respiratory: Negative for cough, shortness of breath and wheezing.    Cardiovascular: Negative for chest pain and palpitations.   Gastrointestinal: Negative for abdominal pain, nausea and vomiting.   Genitourinary: Negative for dysuria.   Musculoskeletal: Negative for back pain.   Neurological: Negative for dizziness, loss of consciousness and headaches.   Endo/Heme/Allergies: Does not bruise/bleed easily.   Psychiatric/Behavioral: The patient is not nervous/anxious.  ACTIVE MEDICAL PROBLEMS      Patient Active Problem List   Diagnosis Code   ??? Moderate persistent asthma J45.40   ??? Basal cell carcinoma of skin of right upper limb, including shoulder C44.612   ??? Hypertension, benign I10   ??? Lumbar spondylosis M47.816   ??? Osteoarthritis M19.90   ??? Other screening mammogram Z12.31   ??? Preventative health care Z00.00   ??? Rotator cuff tear, left M75.102   ??? Seborrheic keratosis L82.1   ??? Skin lesion L98.9   ??? Obstructive sleep apnea syndrome G47.33   ??? GERD without esophagitis K21.9   ??? Severe obesity with body mass index (BMI) of 35.0 to 39.9 with serious comorbidity (HCC) E66.01   ??? Screening breast examination Z12.39        PAST MEDICAL HISTORY       Past Medical History:   Diagnosis Date   ??? Asthma    ??? HTN (hypertension)    ??? Hx of colonoscopy 2005        PAST SURGICAL HISTORY      Past Surgical History:   Procedure Laterality Date   ??? HX CATARACT REMOVAL Left    ??? HX ROTATOR CUFF REPAIR  2007    Dr. Mickie Bail - [place]   ??? Tiger History     Patient does not qualify to have social determinant information on file (likely too young).   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 No Known Problems Son    ??? No Known Problems Daughter         VITALS      Vitals:    11/15/18 0857   BP: 121/68   Pulse: 69   Resp: 16   Temp: 98.1 ??F (36.7 ??C)   TempSrc: Oral   Weight: 192 lb 12.8 oz (87.5 kg)   Height: 5' 5" (1.651 m)     Body mass index is 32.08 kg/m??.     BP Readings from Last 3 Encounters:   11/15/18 121/68   09/06/18 140/84   04/28/18 129/77     Wt Readings from Last 3 Encounters:   11/15/18 192 lb 12.8 oz (87.5 kg)   09/06/18 209 lb 3.2 oz (94.9 kg)   04/28/18 217 lb (98.4 kg)        PHYSICAL EXAM         Physical Exam  Vitals signs and nursing note reviewed.   Constitutional:       Appearance: Normal appearance.   HENT:      Mouth/Throat:      Pharynx: Oropharynx is clear.   Eyes:      Conjunctiva/sclera: Conjunctivae normal.   Neck:      Musculoskeletal: Neck supple.   Cardiovascular:      Rate and Rhythm: Normal rate and regular rhythm.      Heart sounds: No murmur. No friction rub. No gallop.    Pulmonary:      Breath sounds: Normal breath sounds. No wheezing or rhonchi.   Abdominal:  General: Bowel sounds are normal.      Palpations: Abdomen is soft.   Neurological:      General: No focal deficit present.       Mental Status: She is alert and oriented to person, place, and time.   Psychiatric:         Mood and Affect: Mood normal.         Behavior: Behavior normal.          ORDERS & MEDS (NEW)      Orders Placed This Encounter   ??? CBC WITH AUTOMATED DIFF     Standing Status:   Future     Standing Expiration Date:   07/14/2228   ??? METABOLIC PANEL, COMPREHENSIVE     Standing Status:   Future     Standing Expiration Date:   11/16/2019   ??? LIPID PANEL W/ REFLX DIRECT LDL     Standing Status:   Future     Standing Expiration Date:   11/16/2019        WELLNESS EVALUATION / HEALTH RISK ASSESSMENT       ADVANCE DIRECTIVE  Do you have an Advance Directive?   Yes     Advanced care planning directives were discussed with the patient and /or family/caregiver.        ALCOHOL & SMOKING HISTORY:  She  reports that she has never smoked. She has never used smokeless tobacco. She reports current alcohol use. She reports that she does not use drugs.     CAGE-AID  No flowsheet data found.     In the past year, have you used street drugs or medications not prescribed to you?  No                                                                                                    OPIOID USE RISK ASSESSMENT  Is the patient currently taking a prescribed opiate pain medication?   NO                                                                           FALL RISK  Fall Risk Assessment, last 12 mths 04/28/2018   Able to walk? Yes   Fall in past 12 months? No     Do you have difficulty with balance or walking?  No     Do you have a serious difficulty hearing?  No        DEPRESSION SCREENING  Severity:  0-4 none; 5-9 mild; 10-14 moderate; 15-19 moderately severe; 20-27 severe  3 most recent PHQ Screens 04/28/2018   Little interest or pleasure in doing things Not at all   Feeling down, depressed, irritable, or hopeless Not at all   Total Score PHQ 2 0        In  the past 2 weeks, how often have you felt nervous, anxious, or on edge?  Almost never      Do you or a family member have any concern about your memory?   No     In general, would you say your health is...     Good       Do you worry whether your food will run out before you have money to buy more?   No     In the past 7 days, did you need help from others to perform everyday activities such as eating, getting dressed, grooming, bathing, walking, or using the toilet?  No     In the past 7 days, did you need help from others to take care of such things like laundry, housekeeping, banking, shopping, using the telephone, food preparation, transportation or taking your own medicine?  No     How many days a week do you do physical activity such as walking, swimming, yoga, or other exercise?  2 days per week.              On those days, how many minutes are you usually active?  120 minutes per day.     SCREENING  QUESTIONNAIRES       STOP-BANG (3+ high risk)        MEMORY EVALUATION (if needed)  No flowsheet data found.     VISION (for Welcome to Medicare Visit)  No exam data present     [place] TEAM  Patient Care Team:  Jerilee Field, MD as PCP - General (Internal Medicine)     IMMUNIZATIONS  Immunization History   Administered Date(s) Administered   ??? Influenza High Dose Vaccine PF 08/12/2016, 08/13/2016   ??? Influenza Vaccine 08/09/2015, 08/17/2017   ??? Pneumococcal Conjugate (PCV-13) 04/23/2014   ??? Pneumococcal Polysaccharide (PPSV-23) 10/03/2015   ??? Tdap 10/03/2015     COLONOSCOPY (most recent)  Colonoscopy, External   Date Value Ref Range Status   08/23/2016 there were findings  Final        Health Maintenance Topics with due status: Overdue       Topic Date Due    Shingrix Vaccine Age 78> 01/11/1997    GLAUCOMA SCREENING Q2Y 01/12/2012    MEDICARE YEARLY EXAM 01/31/2017    Influenza Age 90 to Adult 06/08/2018     Health Maintenance Topics with due status: Not Due       Topic Last Completion Date    DTaP/Tdap/Td series 10/03/2015    COLONOSCOPY 08/23/2016     BREAST CANCER SCRN MAMMOGRAM 11/02/2018     Health Maintenance Topics with due status: Completed       Topic Last Completion Date    Bone Densitometry (Dexa) Screening 12/23/2014    Pneumococcal 65+ years 10/03/2015    Hepatitis C Screening 10/03/2017           EDUCATION & COUNSELING PROVIDED (based on today's review & evaluation)  ? Healthy diet, maintaining a healthy weight, and getting at least 30 minutes of exercise most every day.    ? End-of-Life planning.    ? Immunizations:   ? Pneumococcal Vaccine:    ? If <65:  (PPSV23 (Pneumovax) once for high risk patients - Smokers, Alcoholics, Chronic Lung, Heart, or Liver Dz)  ? Up to date as of 11/15/2018   ? If >64:  PCV13 (Prevnar) at 65 followed 1 year later by PPSV23 (Pneumovax)  ?  Received both PCV13 & PPSV23   ? Influenza Vaccine: Discussed annual vaccination.    ? Shingles Vaccine:  (Shingrix for those over 50, 2 shots 2-6 months apart)  ? Zostavax:  uptodate in the past.    ? Shingrix:  Received both doses   ? Tdap / Td Booster (every 10 yrs)  ? Last: 10/03/2015  ? Action:   Up to date as of 11/15/2018    ? Colon CA Screening:    ? Colonoscopy (Q10Y), FIT test (Q1Y), or Cologuard (Q3Y) from 50-75 with consideration of ongoing screening from 76-85.    ? Last colonoscopy:  colonoscopy normal on 08/23/2016  ? Next due:  10 years  ? Action:  Up to date as of 11/15/18  ? Mammography (every 1-2 years, 60-75 y.o)    Up to date as of 11/15/18  ? Cervical CA Screening (stop at 72 y.o.)  ? Not indicated  ? Bone Density testing (DEXA):  (if over 65)    ? Not indicated  ? Abdominal aortic aneurysm screening:  (if over 31 with a FH of AAA)   ??         Not indicated   ? Lung CA screening with Low dose CT:    ? (55-77 y.o., 15 PYH, and currently smoking or quit less than 15 years ago, Annually if at risk).    ? Not indicated  ? Hepatitis C screening: (if born between [birth year] through 1965).     ? Not indicated  ? Cholesterol screening:  (every 5 years after age 36)    ? Diabetes screening: (40 to 67 who are overweight or obese)   ? Sunscreen use and sun exposure recommendations reviewed.    ? Aspirin therapy reviewed including risks and benefits.  ? Reviewed safe amounts of alcohol use (No more than 1 drink per day).    ? All healthcare maintenance items due as above.      Medicare Screening Schedule Reviewed and Handout with detailed personalized recommendations was given to the patient.     ASSESSMENT AND PLAN       Diagnoses and all orders for this visit:    1. Medicare annual wellness visit, subsequent  Vaccinations uptodate.     2. Hypertension, benign  BP stable. Continue current medication. -     CBC WITH AUTOMATED DIFF; Future  -     METABOLIC PANEL, COMPREHENSIVE; Future    3. GERD without esophagitis  Stable on ranitidine    4. Moderate persistent asthma without complication  Stable on duoneb as needed.    5. Obstructive sleep apnea syndrome  Compliant with CPAP.    6. Screening breast examination  Mammogram uptodate    7. Preventative health care  Colnooscopy uptodate on 08/23/2016, next in 10 years.  8. Severe obesity with body mass index (BMI) of 35.0 to 39.9 with serious comorbidity (HCC)  -     LIPID PANEL W/ REFLX DIRECT LDL; Future           COGNITIVE ASSESSMENT  Based on my evaluation of the patient and the Health Risk Assessment performed today, there isn't evidence of cognitive impairment.     Follow-up and Dispositions    ?? Return in about 6 months (around 05/16/2019) for OV40.          Future Appointments   Date Time Provider [place]   05/22/2019  9:40 AM Jerilee Field, MD [facility]  Jerilee Field, MD  11/15/2018

## 2018-11-15 NOTE — Telephone Encounter (Signed)
11/15/18 [facility] Medical Evaluation Form and Med List given to PCP. Form due to BMV before 01/11/19.

## 2018-11-16 NOTE — Telephone Encounter (Signed)
11/16/2018 I spoke with patient's husband, [name]. I let him know BMV form completed.

## 2018-11-16 NOTE — Telephone Encounter (Signed)
11/16/2018 Form Completed, faxed and scanned to chart.

## 2018-11-17 LAB — COMPREHENSIVE METABOLIC PANEL
ALT: 16 IU/L (ref 7–52)
AST: 20 IU/L (ref 13–39)
Albumin: 4.2 g/dL (ref 3.5–5.7)
Alkaline Phosphatase: 58 IU/L (ref 34–104)
Anion Gap: 5 mEq/L (ref 3–11)
BUN: 19 mg/dL (ref 7–25)
CO2: 32 mEq/L — ABNORMAL HIGH (ref 21–31)
Calcium: 9.7 mg/dL (ref 8.6–10.3)
Chloride: 102 mEq/L (ref 98–107)
Creatinine: 1.05 mg/dL (ref 0.60–1.20)
Est, Glomerular Filtration Rate: 52 NA
Glucose: 95 mg/dL (ref 70–120)
Potassium: 4.2 mEq/L (ref 3.5–5.1)
Sodium: 139 mEq/L (ref 136–145)
Total Bilirubin: 0.5 mg/dL (ref 0.2–1.0)
Total Protein: 6.9 g/dL (ref 6.0–8.0)

## 2018-11-17 LAB — DIFFERENTIAL, AUTO.
ABS. BASOPHILS: 0.05 10*3/uL (ref 0.00–0.20)
ABS. IMM. GRANS.: 0.03 10*3/uL (ref 0.00–0.06)
ABS. MONOCYTES: 0.5 10*3/uL (ref 0.10–0.80)
ABS. NEUTROPHILS: 5.94 10*3/uL (ref 1.90–7.80)
Abs Lymphocytes: 2.15 10*3/uL (ref 1.00–4.50)
BASOPHILS: 0.6 % (ref 0.0–2.0)
BRCH EOSINS: 0.7 % (ref 0.0–5.0)
BRCH EOSINS: 0.7 % (ref 0.0–5.0)
BRCH NEUTROPHIL: 68.1 % (ref 45.0–85.0)
Basophils %: 0.6 % (ref 0.0–2.0)
Basophils Absolute: 0.05 10*3/uL (ref 0.00–0.20)
Eos abs-DIF: 0.06 10*3/uL (ref 0.00–0.50)
Eos abs-DIF: 0.06 10*3/uL (ref 0.00–0.50)
Granulocyte Absolute Count: 0.03 10*3/uL (ref 0.00–0.06)
IMMATURE GRANULOCYTES: 0.3 %
Immature Granulocytes: 0.3 %
LYMPHOCYTES: 24.6 % — ABNORMAL LOW (ref 25.0–45.0)
Lymphocytes %: 24.6 % — ABNORMAL LOW (ref 25.0–45.0)
Lymphocytes Absolute: 2.15 10*3/uL (ref 1.00–4.50)
MONOCYTES %, TEST14: 5.7 % (ref 1.0–9.0)
MONOCYTES: 5.7 % (ref 1.0–9.0)
Monocytes Absolute: 0.5 10*3/uL (ref 0.10–0.80)
Neutrophil Count, Fluid: 68.1 % (ref 45.0–85.0)
Neutrophils Absolute: 5.94 10*3/uL (ref 1.90–7.80)

## 2018-11-17 LAB — CBC WITH AUTO DIFFERENTIAL
Hematocrit: 40.8 % (ref 36.0–47.0)
Hemoglobin: 13.7 g/dL (ref 12.0–16.0)
MCH: 31.6 pg (ref 28.0–34.0)
MCHC: 33.6 g/dL (ref 32.0–36.0)
MCV: 94 fL (ref 80.0–100.0)
MPV: 9.3 fL (ref 8.5–12.0)
Platelets: 373 10*3/uL (ref 150–400)
RBC: 4.34 Mil/uL (ref 4.20–5.40)
RDW-CV,2213: 12.8 % (ref 11.5–13.5)
RDW-SD: 44 fL (ref 35.0–47.0)
WBC: 8.7 10*3/uL (ref 4.8–10.8)

## 2018-11-17 LAB — LIPID PANEL W/ REFLEX DIRECT LDL
Cholesterol, Total: 169 mg/dL (ref 165–199)
HDL: 32 mg/dL — ABNORMAL LOW (ref 40–91)
LDL Calculated: 110 mg/dL (ref 70–129)
NON HDL CHOL. (LDL+VLDL), 804568: 137 mg/dL (ref <=160)
Triglycerides: 135 mg/dL (ref 50–149)

## 2018-11-17 LAB — LIPID PANEL W/ REFLX DIRECT LDL
Cholesterol, total: 169 mg/dL (ref 165–199)
HDL Cholesterol: 32 mg/dL — ABNORMAL LOW (ref 40–91)
LDL CHOL, CALCULATED: 110 mg/dL (ref 70–129)
LDL+VLDL: 137 mg/dL (ref <=160)
Triglyceride: 135 mg/dL (ref 50–149)

## 2018-11-17 LAB — CBC WITH AUTOMATED DIFF
HCT: 40.8 % (ref 36.0–47.0)
HGB: 13.7 g/dL (ref 12.0–16.0)
MCH: 31.6 pg (ref 28.0–34.0)
MCHC: 33.6 g/dL (ref 32.0–36.0)
MCV: 94 fL (ref 80.0–100.0)
MEAN PLATELET VOLUME: 9.3 fL (ref 8.5–12.0)
PLATELET: 373 10*3/uL (ref 150–400)
RBC: 4.34 Mil/uL (ref 4.20–5.40)
RDW-CV: 12.8 % (ref 11.5–13.5)
RDW-SD: 44 fL (ref 35.0–47.0)
WBC: 8.7 10*3/uL (ref 4.8–10.8)

## 2018-11-17 LAB — METABOLIC PANEL, COMPREHENSIVE
ALT (SGPT): 16 IU/L (ref 7–52)
AST (SGOT): 20 IU/L (ref 13–39)
Albumin: 4.2 g/dL (ref 3.5–5.7)
Alk. phosphatase: 58 IU/L (ref 34–104)
Anion gap: 5 mEq/L (ref 3–11)
BUN: 19 mg/dL (ref 7–25)
Bilirubin, total: 0.5 mg/dL (ref 0.2–1.0)
CO2: 32 mEq/L — ABNORMAL HIGH (ref 21–31)
Calcium: 9.7 mg/dL (ref 8.6–10.3)
Chloride: 102 mEq/L (ref 98–107)
Creatinine: 1.05 mg/dL (ref 0.60–1.20)
Glucose: 95 mg/dL (ref 70–120)
Potassium: 4.2 mEq/L (ref 3.5–5.1)
Protein, total: 6.9 g/dL (ref 6.0–8.0)
Sodium: 139 mEq/L (ref 136–145)
eGFR: 52

## 2018-11-24 IMAGING — OT DXA BONE DENSITY
2 series · 2 of 2 positions shown · non-contrast
Comparison: none

REASON FOR EXAM: Postmenopausal screening

RISK FACTORS:  None
PRIOR EXAMS:  None
METHOD:  Scans of the spine and hip were performed using dual energy X-ray densitometry (DXA) with the Hologic Discovery SL ([id number])  system.

[Series 1: — · 1 of 1 slices shown (1 of 2)]
[im 1/1]
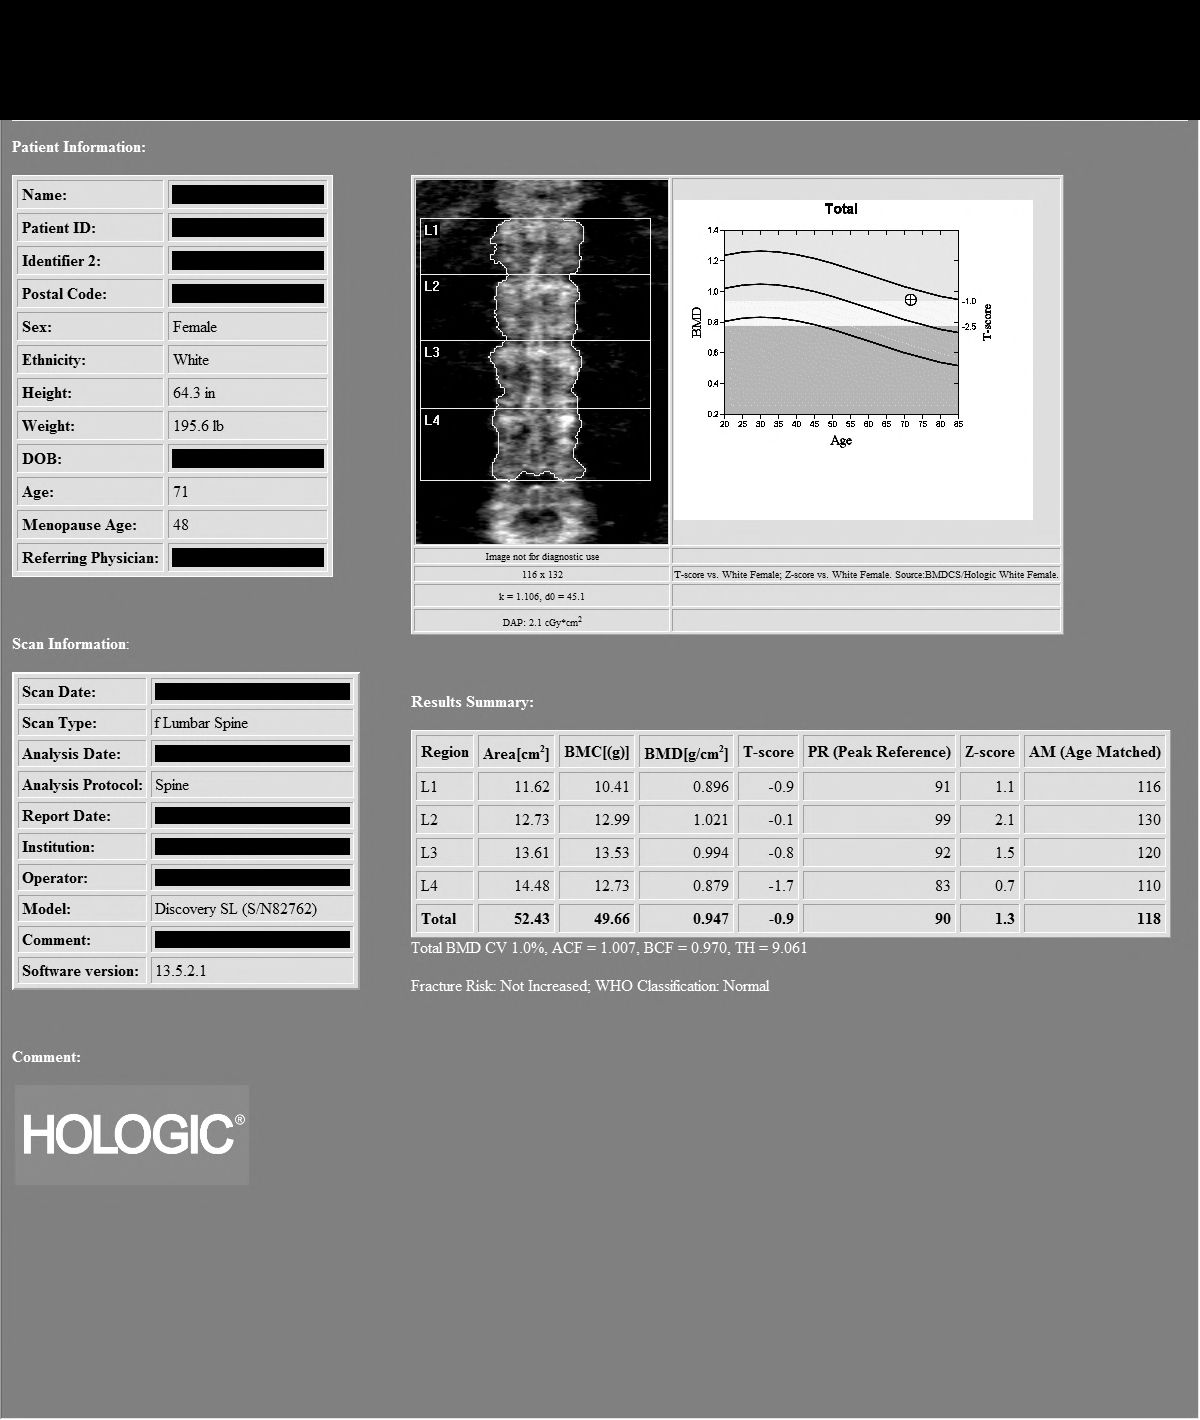

[Series 2: — · left · 1 of 1 slices shown (2 of 2)]
[im 1/1]
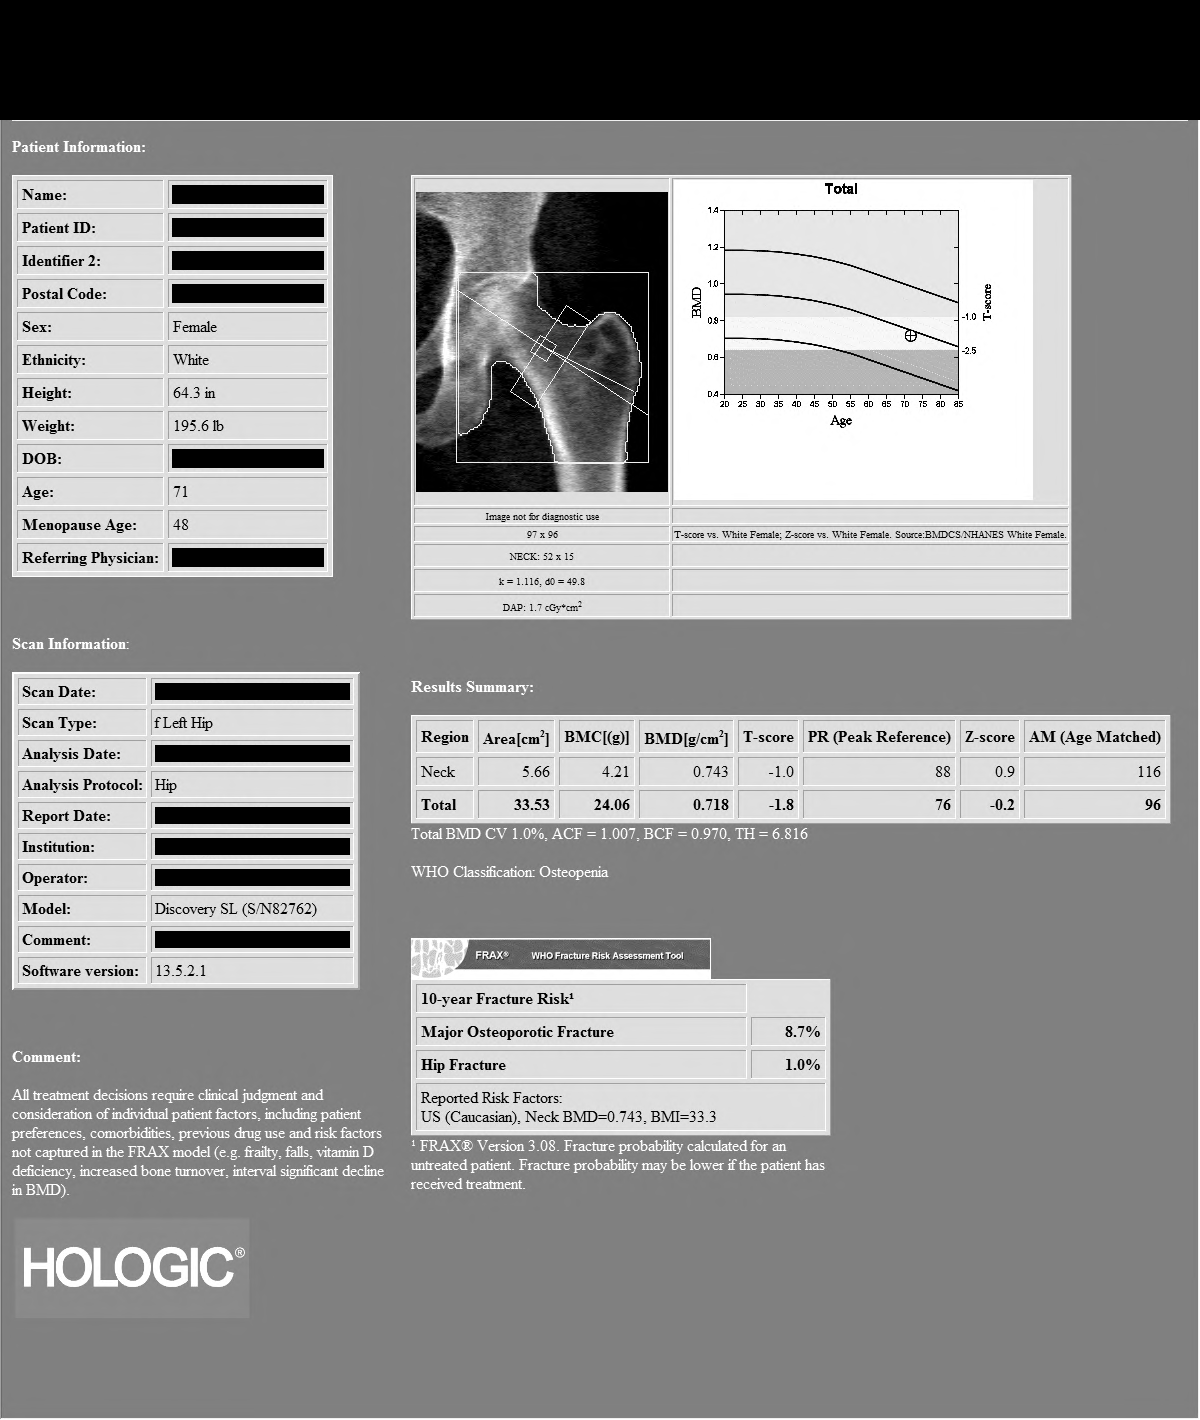

[2 of 2 positions shown; findings below may reference images not displayed]

IMPRESSION: As defined by World Health Organization, the patient meets the criteria for OSTEOPENIA based on hip T-score.

PATIENT DEMOGRAPHICS:  72-year-old white  female.
FINDINGS: 1.    Review of scanogram images shows no factor invalidating scan results.  

2.    The lumbar spine exam using L1-L4 regions shows average Bone Mineral Density is 0.947 gm/cm2 of Hydroxyapatite.  The T-score (comparing patient with a young adult group) is 0.9 standard deviations BELOW mean. The Z-score (comparing patient with an age-matched group) is 1.3 standard deviations ABOVE mean.

3.  The left hip exam using total region of interest shows average Bone Mineral Density is 0.718 gm/cm2 of Hydroxyapatite. The T-score (comparing patient with a young adult group) is 1.8 standard deviations BELOW mean. The Z-score (comparing patient with an age-matched group) is 0.2 standard deviations BELOW mean.

According to the World Health Organization risk assessment tool (FRAX) for osteopenia only, which uses the femoral neck T score and includes other patient risk factors for fracture, the patient has a 10-year absolute risk of hip fracture of 1.0% and 10-year absolute risk fracture for any major fracture of 8.7%.

RECOMMENDATIONS: In addition to assuring that the patient is taking supplemental calcium and vitamin D which the patient has indicated she IS NOT taking, regular exercise to patient tolerance is recommended. The patient IS NOT taking prescribed medication for prevention of bone loss.

According to criteria established by The National Osteoporosis Foundation, the patient DOES NOT meet the current indications for prescribed medical therapy.  

The National Osteoporosis Foundation now recommends followup DXA scanning every two years in patients at risk regardless of whether the patient is undergoing pharmacological treatment.

World Health Organization criteria for diagnosis, please see link below.

[URL]

## 2019-01-08 ENCOUNTER — Ambulatory Visit (INDEPENDENT_AMBULATORY_CARE_PROVIDER_SITE_OTHER): Admitting: Family

## 2019-01-08 ENCOUNTER — Encounter (INDEPENDENT_AMBULATORY_CARE_PROVIDER_SITE_OTHER): Payer: Self-pay | Admitting: Family

## 2019-01-08 VITALS — BP 114/68 | HR 97 | Temp 97.4°F | Resp 16 | Ht 62.5 in | Wt 144.0 lb

## 2019-01-08 LAB — UA, CHEM ONLY POCT
Blood: NEGATIVE
Specific Gravity: 1.025 (ref 1.002–1.030)
pH: 5 (ref 5–8)

## 2019-01-08 MED ORDER — AMOXICILLIN-POT CLAVULANATE 875-125 MG OR TABS
1.0000 | ORAL_TABLET | Freq: Two times a day (BID) | ORAL | 0 refills | Status: AC
Start: 2019-01-08 — End: 2019-01-15

## 2019-01-08 MED ORDER — BENZONATATE 100 MG OR CAPS
100.0000 mg | ORAL_CAPSULE | Freq: Three times a day (TID) | ORAL | 0 refills | Status: DC | PRN
Start: 2019-01-08 — End: 2019-02-05

## 2019-01-10 LAB — URINE CULTURE

## 2019-01-11 ENCOUNTER — Ambulatory Visit (INDEPENDENT_AMBULATORY_CARE_PROVIDER_SITE_OTHER): Admitting: Family Medicine

## 2019-01-11 ENCOUNTER — Encounter (INDEPENDENT_AMBULATORY_CARE_PROVIDER_SITE_OTHER): Payer: Self-pay | Admitting: Family Medicine

## 2019-01-11 ENCOUNTER — Encounter

## 2019-01-11 VITALS — BP 140/64 | HR 100 | Temp 96.8°F | Resp 14 | Ht 62.5 in | Wt 142.0 lb

## 2019-01-11 MED ORDER — TRAZODONE HCL 50 MG OR TABS
50.0000 mg | ORAL_TABLET | Freq: Every evening | ORAL | 1 refills | Status: DC
Start: 2019-01-11 — End: 2019-07-01

## 2019-01-11 MED ORDER — DIPHENOXYLATE-ATROPINE 2.5-0.025 MG OR TABS
1.0000 | ORAL_TABLET | Freq: Four times a day (QID) | ORAL | 1 refills | Status: DC | PRN
Start: 2019-01-11 — End: 2020-01-03

## 2019-01-11 MED ORDER — ATORVASTATIN CALCIUM 20 MG OR TABS
20.0000 mg | ORAL_TABLET | Freq: Every day | ORAL | 3 refills | Status: DC
Start: 2019-01-11 — End: 2020-01-03

## 2019-01-11 MED ORDER — ZOSTER VAC RECOMB ADJUVANTED 50 MCG/0.5ML IM SUSR
0.5000 mL | Freq: Once | INTRAMUSCULAR | 1 refills | Status: AC
Start: 2019-01-11 — End: 2019-01-11

## 2019-01-12 ENCOUNTER — Encounter (INDEPENDENT_AMBULATORY_CARE_PROVIDER_SITE_OTHER): Payer: Self-pay | Admitting: Family Medicine

## 2019-01-12 MED ORDER — HYDROCHLOROTHIAZIDE 25 MG TAB
25 mg | ORAL_TABLET | ORAL | 3 refills | Status: DC
Start: 2019-01-12 — End: 2020-01-03

## 2019-01-12 NOTE — Telephone Encounter (Signed)
Last appt @ PCP Office:  Future Appointments   Date Time Provider Department Center   05/22/2019  9:40 AM Harrington Challenger, MD [facility]   11/20/2019  9:00 AM Harrington Challenger, MD [facility]       MOST RECENT BLOOD PRESSURES  BP Readings from Last 3 Encounters:   11/15/18 121/68   09/06/18 140/84   04/28/18 129/77        MOST RECENT LAB DATA  Lab Results   Component Value Date/Time    Creatinine 1.05 11/17/2018 07:27 AM    Potassium 4.2 11/17/2018 07:27 AM    ALT (SGPT) 16 11/17/2018 07:27 AM    Cholesterol, total 169 11/17/2018 07:27 AM    HGB 13.7 11/17/2018 07:27 AM    HCT 40.8 11/17/2018 07:27 AM

## 2019-01-15 MED ORDER — WIXELA INHUB 250 MCG-50 MCG/DOSE POWDER FOR INHALATION
250-50 mcg/dose | RESPIRATORY_TRACT | 0 refills | Status: DC
Start: 2019-01-15 — End: 2019-04-04

## 2019-01-17 ENCOUNTER — Telehealth (INDEPENDENT_AMBULATORY_CARE_PROVIDER_SITE_OTHER): Payer: Self-pay | Admitting: Family

## 2019-01-17 MED ORDER — CEPHALEXIN 500 MG OR CAPS
500.0000 mg | ORAL_CAPSULE | Freq: Two times a day (BID) | ORAL | 0 refills | Status: AC
Start: 2019-01-17 — End: 2019-01-22

## 2019-02-05 ENCOUNTER — Ambulatory Visit: Admitting: Family Medicine

## 2019-02-05 ENCOUNTER — Telehealth (INDEPENDENT_AMBULATORY_CARE_PROVIDER_SITE_OTHER): Payer: Self-pay | Admitting: Family Medicine

## 2019-02-05 MED ORDER — MELOXICAM 15 MG TAB
15 mg | ORAL_TABLET | ORAL | 3 refills | Status: DC
Start: 2019-02-05 — End: 2020-01-24

## 2019-02-05 MED ORDER — NITROFURANTOIN MONOHYD MACRO 100 MG OR CAPS
100.0000 mg | ORAL_CAPSULE | Freq: Two times a day (BID) | ORAL | 0 refills | Status: AC
Start: 2019-02-05 — End: 2019-02-15

## 2019-03-14 ENCOUNTER — Ambulatory Visit (INDEPENDENT_AMBULATORY_CARE_PROVIDER_SITE_OTHER): Admitting: Family

## 2019-03-14 ENCOUNTER — Telehealth (INDEPENDENT_AMBULATORY_CARE_PROVIDER_SITE_OTHER): Payer: Self-pay | Admitting: Family

## 2019-03-14 DIAGNOSIS — N390 Urinary tract infection, site not specified: Principal | ICD-10-CM

## 2019-03-14 MED ORDER — FAMOTIDINE 20 MG TAB
20 mg | ORAL_TABLET | Freq: Two times a day (BID) | ORAL | 2 refills | Status: AC
Start: 2019-03-14 — End: 2019-06-12

## 2019-03-14 MED ORDER — FAMOTIDINE 20 MG TAB
20 mg | ORAL_TABLET | Freq: Two times a day (BID) | ORAL | 3 refills | Status: DC
Start: 2019-03-14 — End: 2019-03-14

## 2019-03-14 MED ORDER — NITROFURANTOIN MONOHYD MACRO 100 MG OR CAPS
100.0000 mg | ORAL_CAPSULE | Freq: Two times a day (BID) | ORAL | 0 refills | Status: AC
Start: 2019-03-14 — End: 2019-03-21

## 2019-03-14 MED ORDER — NITROFURANTOIN MONOHYD MACRO 100 MG OR CAPS
100.0000 mg | ORAL_CAPSULE | Freq: Two times a day (BID) | ORAL | 0 refills | Status: DC
Start: 2019-03-14 — End: 2019-03-14

## 2019-03-14 NOTE — Telephone Encounter (Signed)
?  ok

## 2019-03-14 NOTE — Telephone Encounter (Signed)
Ok to send for famotidine 20 mg 2 times a day.

## 2019-03-14 NOTE — Telephone Encounter (Signed)
-----   Message from [employer]. Batterson sent at 03/14/2019  2:32 PM EDT -----  Regarding: Referral Request  Contact: [phone]  Hello, Both my husband, Rhonda [name], and I have stopped taking Ranitidine as recommended.  We went to the pharmacy and couldn't find the Famotidine but the pharmacist helped [place] find it.  We bought one box of 25.  The pharmacist told us they have the presciption ones on hand and would be cheaper for [place] through our insurance.  Can you send a prescription for the Famotidine Tablets 20 mg./1X each day?  Thanks.  Rhonda [name]

## 2019-03-15 ENCOUNTER — Encounter (INDEPENDENT_AMBULATORY_CARE_PROVIDER_SITE_OTHER): Payer: Self-pay | Admitting: Family Medicine

## 2019-03-22 ENCOUNTER — Ambulatory Visit: Admitting: Family

## 2019-03-22 ENCOUNTER — Ambulatory Visit (INDEPENDENT_AMBULATORY_CARE_PROVIDER_SITE_OTHER): Admitting: Family

## 2019-03-22 MED ORDER — CRANBERRY PO: ORAL | Status: AC

## 2019-03-22 MED ORDER — ACYCLOVIR 800 MG OR TABS
800.0000 mg | ORAL_TABLET | Freq: Every day | ORAL | 0 refills | Status: AC
Start: 2019-03-22 — End: 2019-03-29

## 2019-04-04 MED ORDER — WIXELA INHUB 250 MCG-50 MCG/DOSE POWDER FOR INHALATION
250-50 mcg/dose | RESPIRATORY_TRACT | 0 refills | Status: DC
Start: 2019-04-04 — End: 2019-06-26

## 2019-05-01 ENCOUNTER — Ambulatory Visit (INDEPENDENT_AMBULATORY_CARE_PROVIDER_SITE_OTHER): Admitting: Family

## 2019-05-01 VITALS — BP 124/82 | HR 74 | Temp 96.7°F | Resp 12 | Ht 62.5 in | Wt 147.0 lb

## 2019-05-01 LAB — UA, CHEM ONLY POCT
Bilirubin: 1
Blood: NEGATIVE
Glucose: NEGATIVE
Ketones: NEGATIVE
Leuk Esterase: NEGATIVE
Nitrite: NEGATIVE
Protein: NEGATIVE
Specific Gravity: 1.03 (ref 1.002–1.030)
Urobilinogen: 0.2 (ref 0.2–1.0)
pH: 6 (ref 5–8)

## 2019-05-01 MED ORDER — NITROFURANTOIN MONOHYD MACRO 100 MG OR CAPS
100.0000 mg | ORAL_CAPSULE | Freq: Two times a day (BID) | ORAL | 0 refills | Status: AC
Start: 2019-05-01 — End: 2019-05-08

## 2019-05-02 LAB — URINE CULTURE

## 2019-05-09 LAB — CBC WITH DIFF, BLOOD
Abs Basophils: 56 cells/uL (ref 0–200)
Abs Eosinophils: 348 cells/uL (ref 15–500)
Abs Lymphs: 2576 cells/uL (ref 850–3900)
Abs Monocytes: 686 cells/uL (ref 200–950)
Abs NRBC: 0 cells/uL
Abs Neutrophils: 5734 cells/uL (ref 1500–7800)
Basophils: 0.6 %
Eosinophils: 3.7 %
HCT: 43.1 % (ref 35.0–45.0)
HGB: 14.7 g/dL (ref 11.7–15.5)
Lymps: 27.4 %
MCH: 31.2 pg (ref 27.0–33.0)
MCHC: 34.1 g/dL (ref 32.0–36.0)
MCV: 91.5 fL (ref 80.0–100.0)
MPV: 11.1 fL (ref 7.5–12.5)
Monocytes: 7.3 %
PLT: 293 10*3/uL (ref 140–400)
RBC: 4.71 10*6/uL (ref 3.80–5.10)
RDW: 13 % (ref 11.0–15.0)
SEGS: 61 %
WBC: 9.4 10*3/uL (ref 3.8–10.8)

## 2019-05-09 LAB — COMPREHENSIVE METABOLIC PANEL, BLOOD
ALT (SGPT): 50 U/L — ABNORMAL HIGH (ref 6–29)
AST (SGOT): 37 U/L — ABNORMAL HIGH (ref 10–35)
Albumin/Glob Ratio: 1.8 (calc) (ref 1.0–2.5)
Albumin: 4.4 g/dL (ref 3.6–5.1)
Alkaline Phos: 91 U/L (ref 37–153)
BUN/Creatinine Ratio: 23 (calc) — ABNORMAL HIGH (ref 6–22)
BUN: 23 mg/dL (ref 7–25)
Bilirubin, Total: 0.7 mg/dL (ref 0.2–1.2)
Calcium: 9.7 mg/dL (ref 8.6–10.4)
Carbon Dioxide: 26 mmol/L (ref 20–32)
Chloride: 104 mmol/L (ref 98–110)
Creatinine: 0.98 mg/dL — ABNORMAL HIGH (ref 0.60–0.93)
Globulin: 2.4 g/dL (calc) (ref 1.9–3.7)
Glucose: 94 mg/dL (ref 65–99)
Potassium: 4.8 mmol/L (ref 3.5–5.3)
Sodium: 139 mmol/L (ref 135–146)
Total Protein: 6.8 g/dL (ref 6.1–8.1)
eGFR African American: 67 mL/min/{1.73_m2} (ref >=60)
eGFR non-Afr.American: 58 mL/min/{1.73_m2} — ABNORMAL LOW (ref >=60)

## 2019-05-09 LAB — HEPATITIS C AB, BLOOD
Hepatis C AB Signal/Cut Off: 0.05 (ref <1.00)
Hepatitis C Ab: NONREACTIVE

## 2019-05-09 LAB — NON HDL CHOLESTEROL -QUEST: Non-HDL Cholesterol: 109 mg/dL (calc) (ref <130)

## 2019-05-09 LAB — VITAMIN B12, BLOOD: Vitamin B12: 1509 pg/mL — ABNORMAL HIGH (ref 200–1100)

## 2019-05-09 LAB — CHOLESTEROL/HDLC RATIO-QUEST: Chol/HDLC Ratio: 3.6 (calc) (ref <5.0)

## 2019-05-09 LAB — HDL-CHOLESTEROL, BLOOD: HDL Cholesterol: 42 mg/dL — ABNORMAL LOW (ref >=50)

## 2019-05-09 LAB — TRIGLYCERIDES, BLOOD: Triglycerides: 147 mg/dL (ref <150)

## 2019-05-09 LAB — THYROID CASCADE: TSH: 2.68 mIU/L (ref 0.40–4.50)

## 2019-05-09 LAB — LDL CHOLESTEROL, DIRECT: LDL-Cholesterol: 85 mg/dL (calc)

## 2019-05-09 LAB — CHOLESTEROL, TOTAL BLOOD: Cholesterol: 151 mg/dL (ref <200)

## 2019-05-09 LAB — FOLIC ACID, BLOOD: Folate: 17 ng/mL

## 2019-05-10 ENCOUNTER — Telehealth (INDEPENDENT_AMBULATORY_CARE_PROVIDER_SITE_OTHER): Payer: Self-pay | Admitting: Family Medicine

## 2019-05-15 ENCOUNTER — Encounter

## 2019-05-16 MED ORDER — LISINOPRIL 40 MG TAB
40 mg | ORAL_TABLET | ORAL | 3 refills | Status: DC
Start: 2019-05-16 — End: 2020-06-05

## 2019-05-22 ENCOUNTER — Encounter: Attending: Internal Medicine | Primary: Internal Medicine

## 2019-06-26 MED ORDER — WIXELA INHUB 250 MCG-50 MCG/DOSE POWDER FOR INHALATION
250-50 mcg/dose | RESPIRATORY_TRACT | 0 refills | Status: DC
Start: 2019-06-26 — End: 2019-09-26

## 2019-07-01 ENCOUNTER — Other Ambulatory Visit (INDEPENDENT_AMBULATORY_CARE_PROVIDER_SITE_OTHER): Payer: Self-pay | Admitting: Family Medicine

## 2019-07-01 MED ORDER — TRAZODONE HCL 50 MG OR TABS
ORAL_TABLET | ORAL | 1 refills | Status: DC
Start: 2019-07-01 — End: 2020-01-03

## 2019-07-10 ENCOUNTER — Ambulatory Visit: Attending: Internal Medicine | Primary: Internal Medicine

## 2019-07-10 ENCOUNTER — Ambulatory Visit: Admit: 2019-07-10 | Discharge: 2019-07-10 | Payer: MEDICARE | Attending: Internal Medicine | Primary: Internal Medicine

## 2019-07-10 DIAGNOSIS — I10 Essential (primary) hypertension: Secondary | ICD-10-CM

## 2019-07-10 NOTE — Progress Notes (Signed)
[facility]   [place] [address]  [phone]    CHIEF COMPLAINT   Rhonda [name] is a 73 y.o. female who presents to clinic for Follow-up; Hypertension; Asthma; Sleep Apnea; and GERD     ASSESSMENT AND PLAN      SUMMARY:      In addition, the below problems were assessed during today's visit and if not discussed above in the Summary above or addressed below are stable based on history, physical exam, review of pertinent labs, studies and medications. Medications have been reconciled. Plan is to continue current medications and have patient follow-up as noted below with me. Orders were placed as noted below.      Diagnoses and all orders for this visit:    1. Hypertension, benign  Repeat BP is acceptable.    2. GERD without esophagitis  Stable.Continue famotidine    3. Moderate persistent asthma without complication  Stable.    4. Obstructive sleep apnea syndrome  Continue CPAP.    5. Severe obesity with body mass index (BMI) of 35.0 to 39.9 with serious comorbidity ([place])  I have reviewed/discussed the above normal BMI with the patient.  I have recommended the following interventions: dietary management education, guidance, and counseling, encourage exercise, monitor weight and prescribed dietary intake . Marland Kitchen          HPI     Rhonda [name] is here for follow up of HTN, GERD, Asthma, OSA  She is doing well.  Repeat bP is 130/80 mm Hg.   She denies any s/s of ischemic chest pain.  GERD symptoms are stable on famotidine.  She is compliant with her CPAP  Breathing is stable. She uses albuterol as needed.      Current Outpatient Medications:   ???  famotidine (PEPCID) 20 mg tablet, Take 1 Tab by mouth daily., Disp: , Rfl:   ???  Wixela Inhub 250-50 mcg/dose diskus inhaler, INHALE 1 PUFF BY MOUTH TWICE DAILY, Disp: 180 Each, Rfl: 0  ???  lisinopriL (PRINIVIL, ZESTRIL) 40 mg tablet, TAKE 2 TABLETS BY MOUTH ONCE DAILY, Disp: 180 Tab, Rfl: 3  ???  meloxicam (MOBIC) 15 mg tablet, TAKE 1/2 TABLET BY MOUTH  TWICE A DAY, Disp: 90 Tab, Rfl: 3  ???  hydroCHLOROthiazide (HYDRODIURIL) 25 mg tablet, TAKE 1 TABLET BY MOUTH ONCE DAILY, Disp: 90 Tab, Rfl: 3  ???  ascorbic acid, vitamin C, (VITAMIN C) 250 mg tablet, Take  by mouth., Disp: , Rfl:   ???  cyanocobalamin (VITAMIN B-12) 1,000 mcg tablet, Take 1,000 mcg by mouth daily., Disp: , Rfl:   ???  albuterol (PROVENTIL HFA) 90 mcg/actuation inhaler, Take 2 Puffs by inhalation every six (6) hours as needed for Wheezing. Inhale 2 puffs four times a day if needed, Disp: 3 Inhaler, Rfl: 2  ???  albuterol-ipratropium (DUO-NEB) 2.5 mg-0.5 mg/3 ml nebu, 3 mL by Nebulization route every six (6) hours as needed. Use every 4 hours as needed for dyspena, Disp: 30 Nebule, Rfl: 2  ???  cpap machine kit, Autoset CPAP 6-10cmH2O., Disp: , Rfl:     Medications Discontinued During This Encounter   Medication Reason   ??? raNITIdine (ZANTAC) 300 mg tab Formulary Change       Active Problem List and Past Medical History reviewed and updated today in the Electronic Health Record.     Allergies   Allergen Reactions   ??? Cefaclor Not Reported This Time   ??? Methylprednisolone Other (comments)  Pt states that she has a reaction    ??? Prednisone Not Reported This Time         REVIEW OF SYSTEMS   Review of Systems   Constitutional: Negative for chills, fever and malaise/fatigue.   HENT: Negative for sore throat.    Eyes: Negative for photophobia.   Respiratory: Negative for cough, shortness of breath and wheezing.    Cardiovascular: Negative for chest pain and palpitations.   Gastrointestinal: Negative for abdominal pain, nausea and vomiting.   Genitourinary: Negative for dysuria.   Musculoskeletal: Negative for back pain.   Neurological: Negative for dizziness, loss of consciousness and headaches.   Psychiatric/Behavioral: The patient is not nervous/anxious.          PHYSICAL EXAM     Vitals:    07/10/19 1637 07/11/19 0813   BP: 144/77 130/80   Pulse: 70    Resp: 16    Weight: 190 lb (86.2 kg)    Height: '5\' 5"'$   (1.651 m)      Body mass index is 31.62 kg/m??.  BP Readings from Last 3 Encounters:   07/11/19 130/80   11/15/18 121/68   09/06/18 140/84     Wt Readings from Last 3 Encounters:   07/10/19 190 lb (86.2 kg)   11/15/18 192 lb 12.8 oz (87.5 kg)   09/06/18 209 lb 3.2 oz (94.9 kg)       Physical Exam  Vitals signs and nursing note reviewed.   Constitutional:       Appearance: Normal appearance.   HENT:      Mouth/Throat:      Pharynx: Oropharynx is clear.   Eyes:      Conjunctiva/sclera: Conjunctivae normal.   Neck:      Musculoskeletal: Neck supple.   Cardiovascular:      Rate and Rhythm: Normal rate and regular rhythm.   Pulmonary:      Breath sounds: Normal breath sounds. No wheezing or rhonchi.   Abdominal:      General: Bowel sounds are normal.      Palpations: Abdomen is soft.   Musculoskeletal:      Right lower leg: No edema.      Left lower leg: No edema.   Neurological:      General: No focal deficit present.      Mental Status: She is alert and oriented to person, place, and time.   Psychiatric:         Mood and Affect: Mood normal.           LABS/IMAGING     Orders Placed This Encounter   ??? famotidine (PEPCID) 20 mg tablet     Sig: Take 1 Tab by mouth daily.       Results from today's visit:    No results found for any visits on 07/10/19.    Most recent labs reviewed as below:            Follow-up and Dispositions    ?? Return in about 6 months (around 01/07/2020) for MWV40.       Future Appointments   Date Time Provider [place]   12/05/2019  9:00 AM Jerilee Field, MD [facility]       Jerilee Field, MD  07/10/2019

## 2019-07-10 NOTE — Progress Notes (Signed)
[facility]   [place] [address]  [phone]    CHIEF COMPLAINT   Rhonda [name] is a 73 y.o. female who presents to clinic for Follow-up; Hypertension; Asthma; Sleep Apnea; and GERD     ASSESSMENT AND PLAN      SUMMARY:      In addition, the below problems were assessed during today's visit and if not discussed above in the Summary above or addressed below are stable based on history, physical exam, review of pertinent labs, studies and medications. Medications have been reconciled. Plan is to continue current medications and have patient follow-up as noted below with me. Orders were placed as noted below.      Diagnoses and all orders for this visit:    1. Hypertension, benign  Repeat BP is acceptable.    2. GERD without esophagitis  Stable.Continue famotidine    3. Moderate persistent asthma without complication  Stable.    4. Obstructive sleep apnea syndrome  Continue CPAP.    5. Severe obesity with body mass index (BMI) of 35.0 to 39.9 with serious comorbidity ([place])  I have reviewed/discussed the above normal BMI with the patient.  I have recommended the following interventions: dietary management education, guidance, and counseling, encourage exercise, monitor weight and prescribed dietary intake . Marland Kitchen          HPI     Rhonda [name] is here for follow up of HTN, GERD, Asthma, OSA  She is doing well.  Repeat bP is 130/80 mm Hg.   She denies any s/s of ischemic chest pain.  GERD symptoms are stable on famotidine.  She is compliant with her CPAP  Breathing is stable. She uses albuterol as needed.      Current Outpatient Medications:   ???  famotidine (PEPCID) 20 mg tablet, Take 1 Tab by mouth daily., Disp: , Rfl:   ???  Wixela Inhub 250-50 mcg/dose diskus inhaler, INHALE 1 PUFF BY MOUTH TWICE DAILY, Disp: 180 Each, Rfl: 0  ???  lisinopriL (PRINIVIL, ZESTRIL) 40 mg tablet, TAKE 2 TABLETS BY MOUTH ONCE DAILY, Disp: 180 Tab, Rfl: 3   ???  meloxicam (MOBIC) 15 mg tablet, TAKE 1/2 TABLET BY MOUTH TWICE A DAY, Disp: 90 Tab, Rfl: 3  ???  hydroCHLOROthiazide (HYDRODIURIL) 25 mg tablet, TAKE 1 TABLET BY MOUTH ONCE DAILY, Disp: 90 Tab, Rfl: 3  ???  ascorbic acid, vitamin C, (VITAMIN C) 250 mg tablet, Take  by mouth., Disp: , Rfl:   ???  cyanocobalamin (VITAMIN B-12) 1,000 mcg tablet, Take 1,000 mcg by mouth daily., Disp: , Rfl:   ???  albuterol (PROVENTIL HFA) 90 mcg/actuation inhaler, Take 2 Puffs by inhalation every six (6) hours as needed for Wheezing. Inhale 2 puffs four times a day if needed, Disp: 3 Inhaler, Rfl: 2  ???  albuterol-ipratropium (DUO-NEB) 2.5 mg-0.5 mg/3 ml nebu, 3 mL by Nebulization route every six (6) hours as needed. Use every 4 hours as needed for dyspena, Disp: 30 Nebule, Rfl: 2  ???  cpap machine kit, Autoset CPAP 6-10cmH2O., Disp: , Rfl:     Medications Discontinued During This Encounter   Medication Reason   ??? raNITIdine (ZANTAC) 300 mg tab Formulary Change       Active Problem List and Past Medical History reviewed and updated today in the Electronic Health Record.     Allergies   Allergen Reactions   ??? Cefaclor Not Reported This Time   ??? Methylprednisolone Other (comments)  Pt states that she has a reaction    ??? Prednisone Not Reported This Time         REVIEW OF SYSTEMS   Review of Systems   Constitutional: Negative for chills, fever and malaise/fatigue.   HENT: Negative for sore throat.    Eyes: Negative for photophobia.   Respiratory: Negative for cough, shortness of breath and wheezing.    Cardiovascular: Negative for chest pain and palpitations.   Gastrointestinal: Negative for abdominal pain, nausea and vomiting.   Genitourinary: Negative for dysuria.   Musculoskeletal: Negative for back pain.   Neurological: Negative for dizziness, loss of consciousness and headaches.   Psychiatric/Behavioral: The patient is not nervous/anxious.          PHYSICAL EXAM     Vitals:    07/10/19 1637 07/11/19 0813   BP: 144/77 130/80    Pulse: 70    Resp: 16    Weight: 190 lb (86.2 kg)    Height: 5' 5" (1.651 m)      Body mass index is 31.62 kg/m??.  BP Readings from Last 3 Encounters:   07/11/19 130/80   11/15/18 121/68   09/06/18 140/84     Wt Readings from Last 3 Encounters:   07/10/19 190 lb (86.2 kg)   11/15/18 192 lb 12.8 oz (87.5 kg)   09/06/18 209 lb 3.2 oz (94.9 kg)       Physical Exam  Vitals signs and nursing note reviewed.   Constitutional:       Appearance: Normal appearance.   HENT:      Mouth/Throat:      Pharynx: Oropharynx is clear.   Eyes:      Conjunctiva/sclera: Conjunctivae normal.   Neck:      Musculoskeletal: Neck supple.   Cardiovascular:      Rate and Rhythm: Normal rate and regular rhythm.   Pulmonary:      Breath sounds: Normal breath sounds. No wheezing or rhonchi.   Abdominal:      General: Bowel sounds are normal.      Palpations: Abdomen is soft.   Musculoskeletal:      Right lower leg: No edema.      Left lower leg: No edema.   Neurological:      General: No focal deficit present.      Mental Status: She is alert and oriented to person, place, and time.   Psychiatric:         Mood and Affect: Mood normal.           LABS/IMAGING     Orders Placed This Encounter   ??? famotidine (PEPCID) 20 mg tablet     Sig: Take 1 Tab by mouth daily.       Results from today's visit:    No results found for any visits on 07/10/19.    Most recent labs reviewed as below:            Follow-up and Dispositions    ?? Return in about 6 months (around 01/07/2020) for MWV40.       Future Appointments   Date Time Provider [place]   12/05/2019  9:00 AM Jerilee Field, MD [facility]       Jerilee Field, MD  07/10/2019

## 2019-07-10 NOTE — Patient Instructions (Signed)
High Blood Pressure: Care Instructions  Overview     It's normal for blood pressure to go up and down throughout the day. But if it stays up, you have high blood pressure. Another name for high blood pressure is hypertension.  Despite what a lot of people think, high blood pressure usually doesn't cause headaches or make you feel dizzy or lightheaded. It usually has no symptoms. But it does increase your risk of stroke, heart attack, and other problems. You and your doctor will talk about your risks of these problems based on your blood pressure.  Your doctor will give you a goal for your blood pressure. Your goal will be based on your health and your age.  Lifestyle changes, such as eating healthy and being active, are always important to help lower blood pressure. You might also take medicine to reach your blood pressure goal.  Follow-up care is a key part of your treatment and safety. Be sure to make and go to all appointments, and call your doctor if you are having problems. It's also a good idea to know your test results and keep a list of the medicines you take.  How can you care for yourself at home?  Medical treatment  ?? If you stop taking your medicine, your blood pressure will go back up. You may take one or more types of medicine to lower your blood pressure. Be safe with medicines. Take your medicine exactly as prescribed. Call your doctor if you think you are having a problem with your medicine.  ?? Talk to your doctor before you start taking aspirin every day. Aspirin can help certain people lower their risk of a heart attack or stroke. But taking aspirin isn't right for everyone, because it can cause serious bleeding.  ?? See your doctor regularly. You may need to see the doctor more often at first or until your blood pressure comes down.  ?? If you are taking blood pressure medicine, talk to your doctor before you take decongestants or anti-inflammatory medicine, such as ibuprofen.  Some of these medicines can raise blood pressure.  ?? Learn how to check your blood pressure at home.  Lifestyle changes  ?? Stay at a healthy weight. This is especially important if you put on weight around the waist. Losing even 10 pounds can help you lower your blood pressure.  ?? If your doctor recommends it, get more exercise. Walking is a good choice. Bit by bit, increase the amount you walk every day. Try for at least 30 minutes on most days of the week. You also may want to swim, bike, or do other activities.  ?? Avoid or limit alcohol. Talk to your doctor about whether you can drink any alcohol.  ?? Try to limit how much sodium you eat to less than 2,300 milligrams (mg) a day. Your doctor may ask you to try to eat less than 1,500 mg a day.  ?? Eat plenty of fruits (such as bananas and oranges), vegetables, legumes, whole grains, and low-fat dairy products.  ?? Lower the amount of saturated fat in your diet. Saturated fat is found in animal products such as milk, cheese, and meat. Limiting these foods may help you lose weight and also lower your risk for heart disease.  ?? Do not smoke. Smoking increases your risk for heart attack and stroke. If you need help quitting, talk to your doctor about stop-smoking programs and medicines. These can increase your chances of quitting for good.    When should you call for help?   Call  911 anytime you think you may need emergency care. This may mean having symptoms that suggest that your blood pressure is causing a serious heart or blood vessel problem. Your blood pressure may be over 180/120.  For example, call 911 if:  ?? You have symptoms of a heart attack. These may include:  ? Chest pain or pressure, or a strange feeling in the chest.  ? Sweating.  ? Shortness of breath.  ? Nausea or vomiting.  ? Pain, pressure, or a strange feeling in the back, neck, jaw, or upper belly or in one or both shoulders or arms.  ? Lightheadedness or sudden weakness.   ? A fast or irregular heartbeat.  ?? You have symptoms of a stroke. These may include:  ? Sudden numbness, tingling, weakness, or loss of movement in your face, arm, or leg, especially on only one side of your body.  ? Sudden vision changes.  ? Sudden trouble speaking.  ? Sudden confusion or trouble understanding simple statements.  ? Sudden problems with walking or balance.  ? A sudden, severe headache that is different from past headaches.  ?? You have severe back or belly pain.  Do not wait until your blood pressure comes down on its own. Get help right away.  Call your doctor now or seek immediate care if:  ?? Your blood pressure is much higher than normal (such as 180/120 or higher), but you don't have symptoms.  ?? You think high blood pressure is causing symptoms, such as:  ? Severe headache.  ? Blurry vision.  Watch closely for changes in your health, and be sure to contact your doctor if:  ?? Your blood pressure measures higher than your doctor recommends at least 2 times. That means the top number is higher or the bottom number is higher, or both.  ?? You think you may be having side effects from your blood pressure medicine.  Where can you learn more?  Go to http://www.healthwise.net/[employer]Help[employer]ctions  Enter X567 in the search box to learn more about "High Blood Pressure: Care Instructions."  Current as of: October 23, 2018??????????????????????????????Content Version: 12.5  ?? 2006-2020 Healthwise, Incorporated.   Care instructions adapted under license by [employer] (which disclaims liability or warranty for this information). If you have questions about a medical condition or this instruction, always ask your healthcare professional. Healthwise, Incorporated disclaims any warranty or liability for your use of this information.

## 2019-07-24 NOTE — Progress Notes (Signed)
HRAs TO ASSESS AT TODAY'S VISIT:    PATIENT IS UP TO DATE ON HRAs

## 2019-07-26 ENCOUNTER — Ambulatory Visit (INDEPENDENT_AMBULATORY_CARE_PROVIDER_SITE_OTHER): Admitting: Family

## 2019-07-26 ENCOUNTER — Encounter (INDEPENDENT_AMBULATORY_CARE_PROVIDER_SITE_OTHER): Payer: Self-pay | Admitting: Family

## 2019-07-26 VITALS — BP 128/70 | HR 74 | Temp 97.2°F | Resp 18 | Ht 62.5 in | Wt 147.0 lb

## 2019-07-26 MED ORDER — GABAPENTIN 100 MG OR CAPS
100.0000 mg | ORAL_CAPSULE | Freq: Every evening | ORAL | 1 refills | Status: DC
Start: 2019-07-26 — End: 2020-01-03

## 2019-07-26 NOTE — Progress Notes (Signed)
Encounter Date:  07/26/19  10:49 AM   PCP: Adrienne Mocha  MRN: [id number]  DOB: 04-01-1946   Chief Complaint   Patient presents with   . Musculoskeletal Problem     Pt c/o having knee, hand, feet pain   . Referral/authorization         SUBJECTIVE    Kathy Cherry   is a 73 year old year old (06/20/1946)  female who presents with a few concerns.     C/o bilateral hand pain, trigger finger both middle finger   C/o left knee pain for the past 2 weeks, no injury or trauma, buckles as she is walking; hx of right knee replacement - wants to see ortho     C/o bilateral feet pain, burning, occurs only in evening - onset for the past few months   Affect sleep   04/2019 b12, TSH, glucose WNL  No cramping   No leg swelling   No pain with walking     C/o gas and abdominal cramping after eating   No specific trigger foods  No vomiting   No dysphagia   No weight changes   No blood or mucous in stool    Due for colonoscopy, has referral         PROBLEM  LIST:  Patient Active Problem List   Diagnosis   . GERD (gastroesophageal reflux disease)   . IBS (irritable bowel syndrome)   . Chronic knee pain after total replacement of right knee joint   . Left shoulder pain   . Allergic rhinitis   . History of bilateral breast cancer   . Hyperlipidemia   . Blurred vision, bilateral   . Acute cystitis with hematuria   . Dysuria   . History of recurrent UTIs   . Family history of colon cancer requiring screening colonoscopy   . Atrophic vaginitis   . History of peptic ulcer   . Urinary tract infection without hematuria, site unspecified   . Upper respiratory tract infection, unspecified type   . High cholesterol   . Insomnia, unspecified type   . Dry eye   . Need for hepatitis C screening test   . Annual physical exam   . Breast cancer screening   . Postmenopausal   . Colon cancer screening   . Need for shingles vaccine   . Atherosclerosis of aorta (CMS-HCC)   . Presence of intraocular lens   . Puckering of macula, bilateral   . Presbyopia   .  Macular puckering of retina   . Vitreous degeneration   . Tear film insufficiency   . Senile nuclear sclerosis   . Recurrent UTI   . Rash   . UTI symptoms   . Bilateral foot pain         PAST MEDICAL HISTORY:  Past Medical History:   Diagnosis Date   . Colitis     x 2 09/24/2017 and 10/28/2017       PAST SURGICAL HISTORY:  Past Surgical History:   Procedure Laterality Date   . SHOULDER OPEN ROTATOR CUFF REPAIR Left 2015   . TOTAL KNEE ARTHROPLASTY  2013   . APPENDECTOMY      73 y/o   . Bleeding Ulcers      73 y/o   . BREAST LUMPECTOMY Left     x 2    . CATARACT EXTRACTION     . LYMPHADENECTOMY Left         FAMILY HISTORY:  Family History   Problem Relation Name Age of Onset   . Breast Cancer Mother     . Diabetes Mother     . Hypertension Mother     . Obesity Mother     . Colon Cancer Father     . Diabetes Father     . Hypertension Father     . Obesity Father     . Breast Cancer Maternal Grandmother     . Breast Cancer Paternal Grandmother           SOCIAL HISTORY:  Social History     Socioeconomic History   . Marital status: Married     Spouse name: Not on file   . Number of children: Not on file   . Years of education: Not on file   . Highest education level: Not on file   Occupational History   . Not on file   Social Needs   . Financial resource strain: Not on file   . Food insecurity     Worry: Not on file     Inability: Not on file   . Transportation needs     Medical: Not on file     Non-medical: Not on file   Tobacco Use   . Smoking status: Former Smoker     Quit date: 2006     Years since quitting: 14.7   . Smokeless tobacco: Never Used   Substance and Sexual Activity   . Alcohol use: Yes     Alcohol/week: 8.3 standard drinks     Types: 1 Glasses of wine per week     Frequency: 2-4 times a month     Comment: rare   . Drug use: No   . Sexual activity: Not on file   Lifestyle   . Physical activity     Days per week: Not on file     Minutes per session: Not on file   . Stress: Not on file   Relationships   .  Social Wellsite geologist on phone: Not on file     Gets together: Not on file     Attends religious service: Not on file     Active member of club or organization: Not on file     Attends meetings of clubs or organizations: Not on file     Relationship status: Not on file   . Intimate partner violence     Fear of current or ex partner: Not on file     Emotionally abused: Not on file     Physically abused: Not on file     Forced sexual activity: Not on file   Other Topics Concern   . Not on file   Social History Narrative   . Not on file     Social History     Tobacco Use   Smoking Status Former Smoker   . Quit date: 2006   . Years since quitting: 14.7   Smokeless Tobacco Never Used      Social History     Substance and Sexual Activity   Alcohol Use Yes   . Alcohol/week: 8.3 standard drinks   . Types: 1 Glasses of wine per week   . Frequency: 2-4 times a month    Comment: rare      Social History     Substance and Sexual Activity   Drug Use No        Immunization History  Administered Date(s) Administered   . Influenza Vaccine (High Dose) >=65 Years 09/02/2018, 09/02/2018         CURRENT  MEDICATIONS:  Outpatient Medications Marked as Taking for the 07/26/19 encounter (Office Visit) with Jorge Mandril, NP   Medication Sig Dispense Refill   . atorvastatin (LIPITOR) 20 MG tablet Take 1 tablet (20 mg) by mouth daily. 90 tablet 3   . CRANBERRY PO      . traZODone (DESYREL) 50 MG tablet TAKE 1 TABLET BY MOUTH NIGHTLY 90 tablet 1          ALLERGIES:    Allergies   Allergen Reactions   . Valium [Diazepam] Other     Per pt gets hyper sensitive            REVIEW OF SYSTEMS:  Review of Systems   Constitutional: Negative for chills, fatigue and fever.   Respiratory: Negative for shortness of breath.    Cardiovascular: Negative for chest pain.   Gastrointestinal: Negative for abdominal pain, nausea and vomiting.        Abdominal cramping, increased gas with meals    Musculoskeletal: Positive for arthralgias. Negative for  joint swelling.   Neurological: Negative for dizziness, weakness and headaches.        Tinging to bottom of both feet            OBJECTIVE       PHYSICAL EXAM:   07/26/19  1029   BP: 128/70   Pulse: 74   Resp: 18   Temp: 97.2 F (36.2 C)     Body mass index is 26.47 kg/m.   Height: 5' 2.5" (158.8 cm)   Weight: 66.7 kg (147 lb 0.8 oz)     Physical Exam  Constitutional:       General: She is not in acute distress.     Appearance: She is not ill-appearing.   Cardiovascular:      Rate and Rhythm: Normal rate.      Pulses: Normal pulses.   Pulmonary:      Effort: Pulmonary effort is normal.   Abdominal:      General: Bowel sounds are normal. There is no distension.      Palpations: Abdomen is soft. There is no mass.      Tenderness: There is no abdominal tenderness. There is no guarding.   Musculoskeletal:      Comments: Arthritis changes noted to both hands, catching with ROM to bilateral 3rd digit, no erythema   No tenderness to palpation over left knee. Crepitus with ROM, no laxity.    Skin:     General: Skin is warm and dry.      Capillary Refill: Capillary refill takes less than 2 seconds.   Neurological:      Mental Status: She is alert and oriented to person, place, and time.   Psychiatric:         Mood and Affect: Mood normal.         Thought Content: Thought content normal.                   ASSESSMENT & PLAN    Jazabella was seen today for musculoskeletal problem and referral/authorization.    Diagnoses and all orders for this visit:    Bilateral hand pain  Assessment & Plan:  Worsening   Xray ordered   Follow up with ortho     Orders:  -     X-Ray Hand, 2 views  -  Orthopedics Clinic  -     X-Ray Hands, 2 views each - Bilateral    Left knee pain, unspecified chronicity  Assessment & Plan:  Worsening   Xray ordered   Follow up with ortho     Orders:  -     X-Ray Knee, 3 views  -     Orthopedics Clinic    Tingling of both feet  Assessment & Plan:  Active  04/2019 b12, TSH, glucose WNL  Declines to see neuro      Trial of gabapentin as directed   Healthy diet and regular exercise encouraged   Follow up in 3-4 weeks to reevaluate     Orders:  -     gabapentin (NEURONTIN) 100 MG capsule; Take 1 capsule (100 mg) by mouth at bedtime.    Symptoms consistent with irritable bowel syndrome  Assessment & Plan:  Active   Trial of FODMAP diet   Refer to GI  Monitor for any change in symptoms     Orders:  -     Gastroenterology Clinic    Screen for colon cancer  -     Gastroenterology Clinic          ICD-10 codes:    ICD-10-CM ICD-9-CM    1. Bilateral hand pain  M79.641 729.5 X-Ray Hand, 2 views    M79.642  Orthopedics Clinic      X-Ray Hands, 2 views each - Bilateral   2. Left knee pain, unspecified chronicity  M25.562 719.46 X-Ray Knee, 3 views      Orthopedics Clinic   3. Tingling of both feet  R20.2 782.0 gabapentin (NEURONTIN) 100 MG capsule   4. Symptoms consistent with irritable bowel syndrome  K58.9 564.1 Gastroenterology Clinic   5. Screen for colon cancer  Z12.11 V76.51 Gastroenterology Clinic           Plan of care discussed with patient including R/B/A of medication(s) and/or diagnostic orders.    Results will be reported to the patient. Advised to contact office if no results reported. Lab results are available on the portal (encouraged registration/use).   To UC/ED with any worsening symptoms.   Patient verbalized understanding and is agreeable to plan of care.  Please contact me with any questions or concerns.     Jorge Mandril [name]   [facility]  [email or web address]

## 2019-07-31 ENCOUNTER — Telehealth (INDEPENDENT_AMBULATORY_CARE_PROVIDER_SITE_OTHER): Payer: Self-pay | Admitting: Family

## 2019-07-31 NOTE — Telephone Encounter (Addendum)
Please inform patient of results -     Hand xray shows osteoarthritis causing deformity bilaterally   Knee xray shows moderate arthritis as well as mild 3 to 4 mm medial subluxation (dislocation) of distal left femur    Follow up with ortho as referred    Jorge Mandril [name]

## 2019-07-31 NOTE — Assessment & Plan Note (Signed)
Worsening   Xray ordered   Follow up with ortho

## 2019-07-31 NOTE — Assessment & Plan Note (Signed)
Active   Trial of FODMAP diet   Refer to GI  Monitor for any change in symptoms

## 2019-07-31 NOTE — Assessment & Plan Note (Signed)
Active  04/2019 b12, TSH, glucose WNL  Declines to see neuro   Trial of gabapentin as directed   Healthy diet and regular exercise encouraged   Follow up in 3-4 weeks to reevaluate

## 2019-07-31 NOTE — Telephone Encounter (Signed)
Mrs. Brook notified she verbalized complete understanding of message.  Ortho referral pending she was given [facility] PA hotline number

## 2019-08-23 ENCOUNTER — Telehealth (INDEPENDENT_AMBULATORY_CARE_PROVIDER_SITE_OTHER): Payer: Self-pay | Admitting: Family

## 2019-08-24 ENCOUNTER — Encounter (INDEPENDENT_AMBULATORY_CARE_PROVIDER_SITE_OTHER): Payer: Self-pay | Admitting: Family Medicine

## 2019-09-26 MED ORDER — FLUTICASONE-SALMETEROL 250 MCG-50 MCG/DOSE DISK DEVICE FOR INHALATION
250-50 mcg/dose | Freq: Two times a day (BID) | RESPIRATORY_TRACT | 2 refills | Status: DC
Start: 2019-09-26 — End: 2020-05-19

## 2019-10-16 ENCOUNTER — Telehealth

## 2019-10-16 NOTE — Telephone Encounter (Signed)
Let her know I ordered for a mammogram.

## 2019-10-16 NOTE — Telephone Encounter (Signed)
-----   Message from [employer]. [place] sent at 10/16/2019 12:20 PM EST -----  Regarding: Non-Urgent Medical Question  Contact: [phone]  Got a question about a mammogram?  I had one last Dec. and want to know if that's an annual exam?  and if so do I need a referral?  Last year was the first time I had it at your center and it was a much better experience than at [facility] in [place]!  Not an urgent question...just getting ready to make an appt. if I need to.  Thanks.  Rhonda [name]

## 2019-10-16 NOTE — Telephone Encounter (Signed)
Patient notified. .

## 2019-11-20 ENCOUNTER — Encounter: Attending: Internal Medicine | Primary: Internal Medicine

## 2019-11-28 ENCOUNTER — Encounter (INDEPENDENT_AMBULATORY_CARE_PROVIDER_SITE_OTHER): Payer: Self-pay | Admitting: Family

## 2019-11-29 ENCOUNTER — Telehealth (INDEPENDENT_AMBULATORY_CARE_PROVIDER_SITE_OTHER): Payer: Self-pay | Admitting: Family

## 2019-11-29 ENCOUNTER — Encounter (INDEPENDENT_AMBULATORY_CARE_PROVIDER_SITE_OTHER): Payer: Self-pay | Admitting: Family

## 2019-11-29 NOTE — Telephone Encounter (Signed)
[facility] - DOS 11/27/2019 scanned into media for your review.    LOV 07/25/2020  No F/u scheduled

## 2019-12-03 NOTE — Telephone Encounter (Signed)
From: Rhonda [name]  To: Harrington Challenger, MD  Sent: 12/03/2019 6:54 AM EST  Subject: Update Medical Information    Ref. a reminder about my mammogram...after the dr. sent the referral, they called my house on 3 different dates, but I was working in the office at the time...they did not leave a message. I finally called them and they had no appts. available until the end of March...due to Covid restrictions, rescheduling etc. I do have it on my calendar for asap.    Ref. other updates, I've had shingles shot, flu shot, and now one Covid vaccine as of last Fri. I also had my yearly eye appt. with Dr. Charise Killian in Sept. and all is well, my eye sight had actually improved since the last visit.    Our appts. with Dr. Glade Lloyd have been rescheduled from 1/8 to 1/27 to 3/29, for our yearly checkups...(my husband [name] and myself).

## 2019-12-05 ENCOUNTER — Encounter: Payer: MEDICARE | Attending: Internal Medicine | Primary: Internal Medicine

## 2019-12-21 MED ORDER — FAMOTIDINE 20 MG TAB
20 mg | ORAL_TABLET | Freq: Two times a day (BID) | ORAL | 3 refills | Status: DC
Start: 2019-12-21 — End: 2020-02-04

## 2019-12-21 NOTE — Telephone Encounter (Signed)
Last appt @ PCP Office:  Future Appointments   Date Time Provider Department Center   02/04/2020  3:40 PM Pandey, Sanjay P, MD [facility]   02/06/2020  8:00 AM [facility] RM 1 [facility] [facility]       MOST RECENT BLOOD PRESSURES  BP Readings from Last 3 Encounters:   07/11/19 130/80   11/15/18 121/68   09/06/18 140/84        MOST RECENT LAB DATA  Lab Results   Component Value Date/Time    Creatinine 1.05 11/17/2018 07:27 AM    Potassium 4.2 11/17/2018 07:27 AM    ALT (SGPT) 16 11/17/2018 07:27 AM    Cholesterol, total 169 11/17/2018 07:27 AM    HGB 13.7 11/17/2018 07:27 AM    HCT 40.8 11/17/2018 07:27 AM

## 2020-01-03 ENCOUNTER — Encounter (INDEPENDENT_AMBULATORY_CARE_PROVIDER_SITE_OTHER): Payer: Self-pay | Admitting: Physician Assistant

## 2020-01-03 ENCOUNTER — Telehealth (INDEPENDENT_AMBULATORY_CARE_PROVIDER_SITE_OTHER): Admitting: Physician Assistant

## 2020-01-03 ENCOUNTER — Encounter

## 2020-01-03 VITALS — Ht 62.0 in | Wt 145.0 lb

## 2020-01-03 MED ORDER — HYDROCHLOROTHIAZIDE 25 MG TAB
25 mg | ORAL_TABLET | Freq: Every day | ORAL | 3 refills | Status: DC
Start: 2020-01-03 — End: 2021-01-06

## 2020-01-03 MED ORDER — CETIRIZINE HCL 10 MG OR TABS
10.0000 mg | ORAL_TABLET | Freq: Every day | ORAL | 1 refills | Status: DC
Start: 2020-01-03 — End: 2020-01-27

## 2020-01-03 MED ORDER — AZITHROMYCIN 250 MG OR TABS
250.0000 mg | ORAL_TABLET | ORAL | 0 refills | Status: DC
Start: 2020-01-03 — End: 2020-05-13

## 2020-01-03 MED ORDER — TRAZODONE HCL 50 MG OR TABS
50.0000 mg | ORAL_TABLET | Freq: Every evening | ORAL | 2 refills | Status: DC
Start: 2020-01-03 — End: 2020-09-21

## 2020-01-03 MED ORDER — DIPHENOXYLATE-ATROPINE 2.5-0.025 MG OR TABS
1.0000 | ORAL_TABLET | Freq: Four times a day (QID) | ORAL | 1 refills | Status: DC | PRN
Start: 2020-01-03 — End: 2020-09-23

## 2020-01-03 MED ORDER — ATORVASTATIN CALCIUM 20 MG OR TABS
20.0000 mg | ORAL_TABLET | Freq: Every day | ORAL | 3 refills | Status: DC
Start: 2020-01-03 — End: 2020-07-15

## 2020-01-03 NOTE — Telephone Encounter (Signed)
Last appt @ PCP Office:  Future Appointments   Date Time Provider Department Center   02/04/2020  3:40 PM Harrington Challenger, MD [facility]   02/06/2020  8:00 AM [facility] 1 [facility] [facility]       MOST RECENT BLOOD PRESSURES  BP Readings from Last 3 Encounters:   07/11/19 130/80   11/15/18 121/68   09/06/18 140/84        MOST RECENT LAB DATA  Lab Results   Component Value Date/Time    Creatinine 1.05 11/17/2018 07:27 AM    Potassium 4.2 11/17/2018 07:27 AM    ALT (SGPT) 16 11/17/2018 07:27 AM    Cholesterol, total 169 11/17/2018 07:27 AM    HGB 13.7 11/17/2018 07:27 AM    HCT 40.8 11/17/2018 07:27 AM

## 2020-01-03 NOTE — Telephone Encounter (Signed)
?  ok pt is due for labs.

## 2020-01-03 NOTE — Telephone Encounter (Signed)
Refilled.

## 2020-01-04 ENCOUNTER — Other Ambulatory Visit (INDEPENDENT_AMBULATORY_CARE_PROVIDER_SITE_OTHER): Payer: Self-pay | Admitting: Physician Assistant

## 2020-01-04 IMAGING — US US SOFT TISSUE UPR EXT LT
1 series · 9 of 9 positions shown · non-contrast
Comparison: None.

HISTORY: Localized swelling, mass and lump, unspecified
TECHNIQUE: Soft tissue ultrasound left elbow at area of clinical concern performed.

[Series 1: us soft tissue upr ext left · 9 acquisitions, 9 frames shown]
[im 1/9]
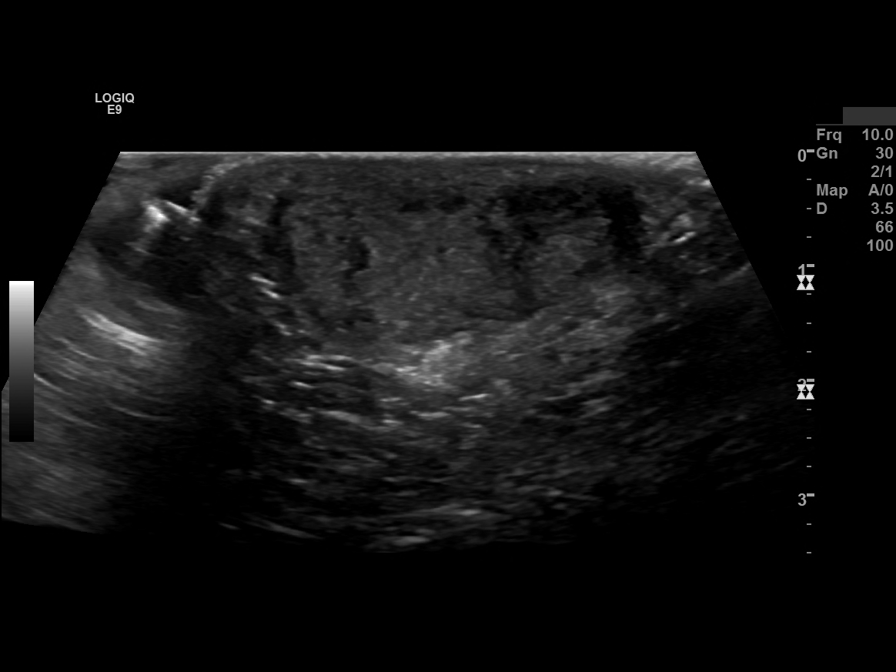
[im 2/9]
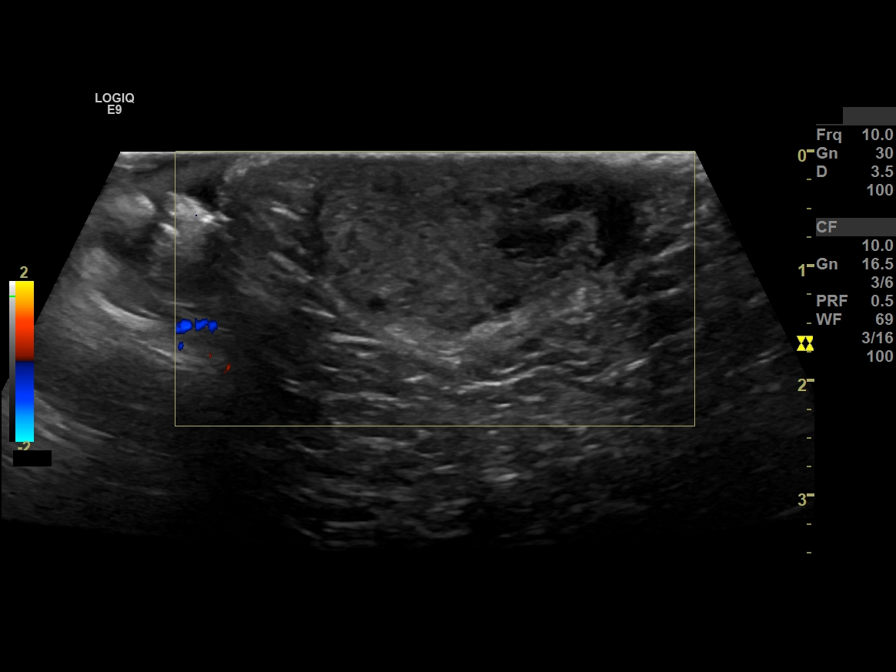
[im 3/9]
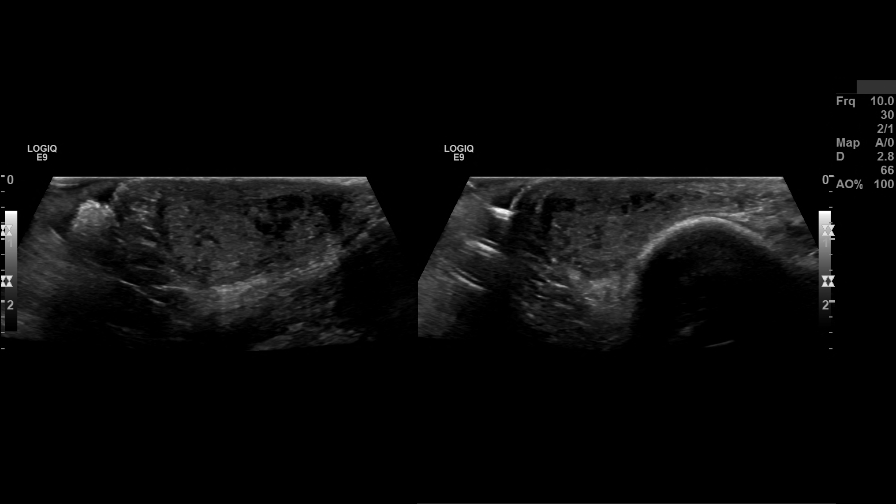
[im 4/9]
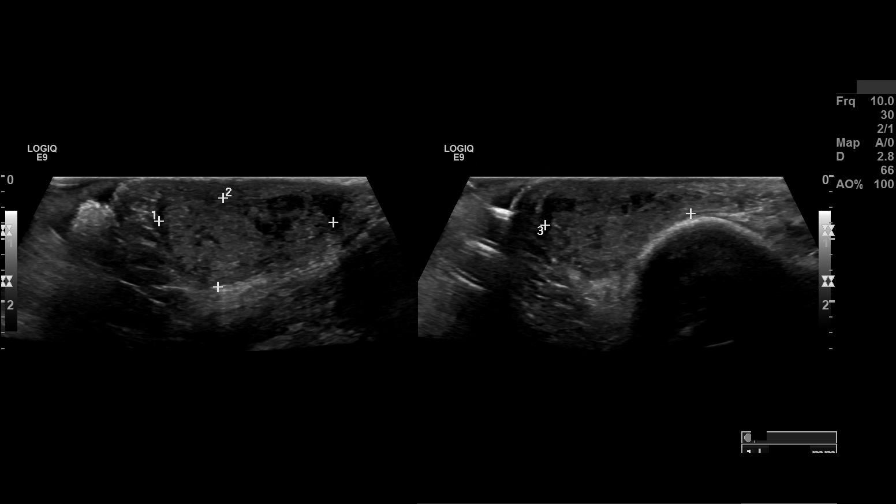
[im 5/9]
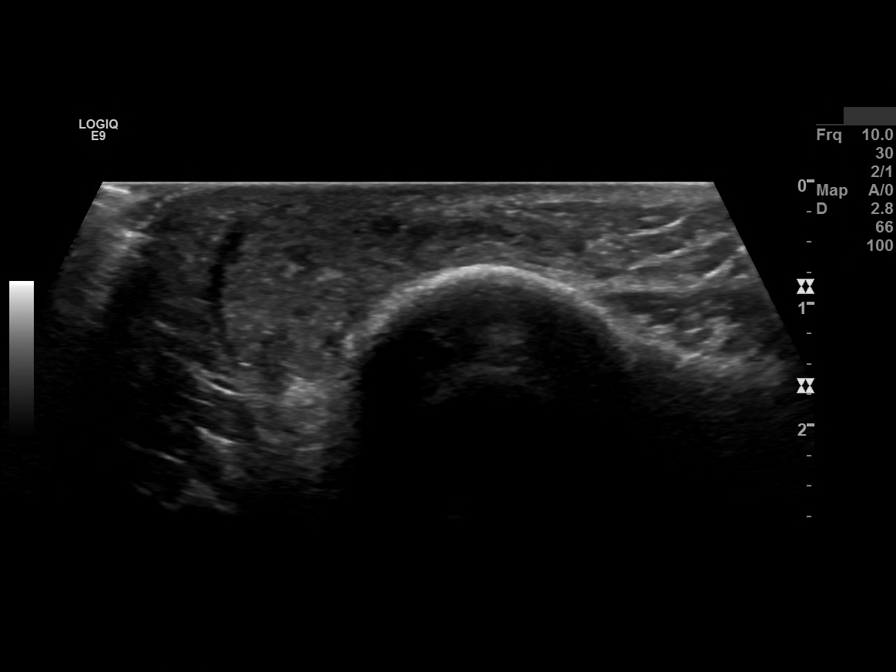
[im 6/9]
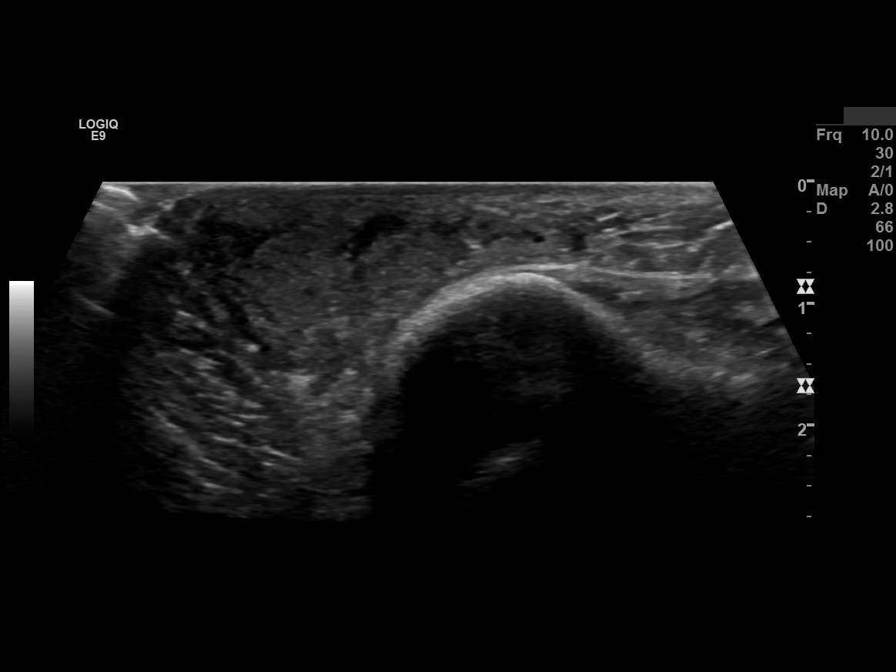
[im 7/9]
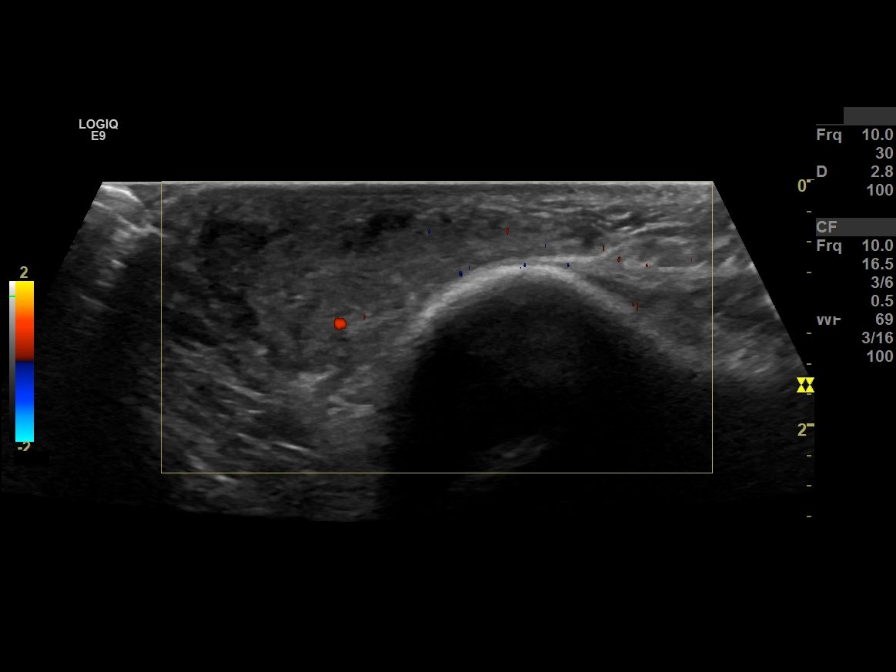
[im 8/9]
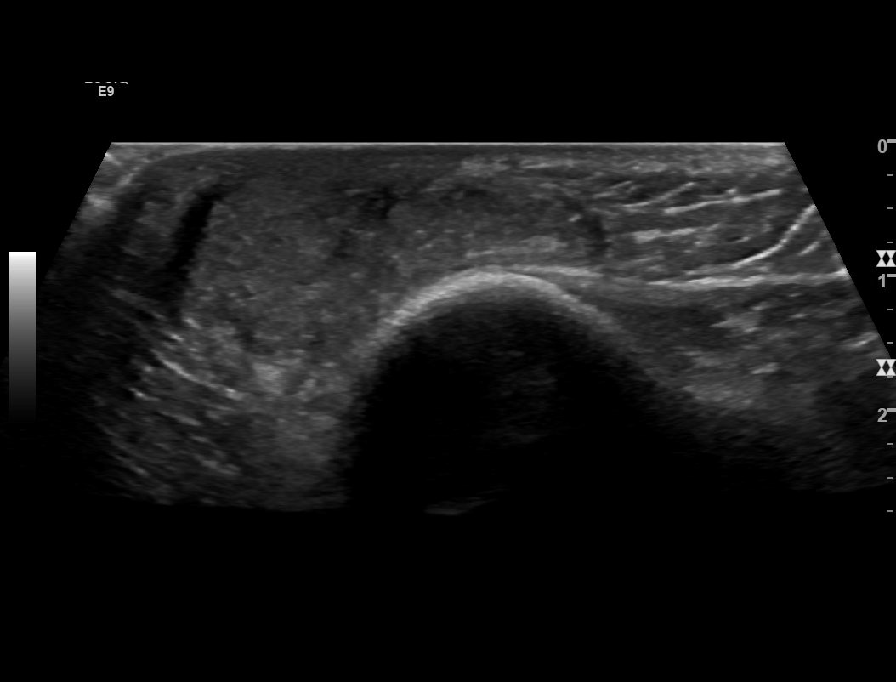
[im 9/9]
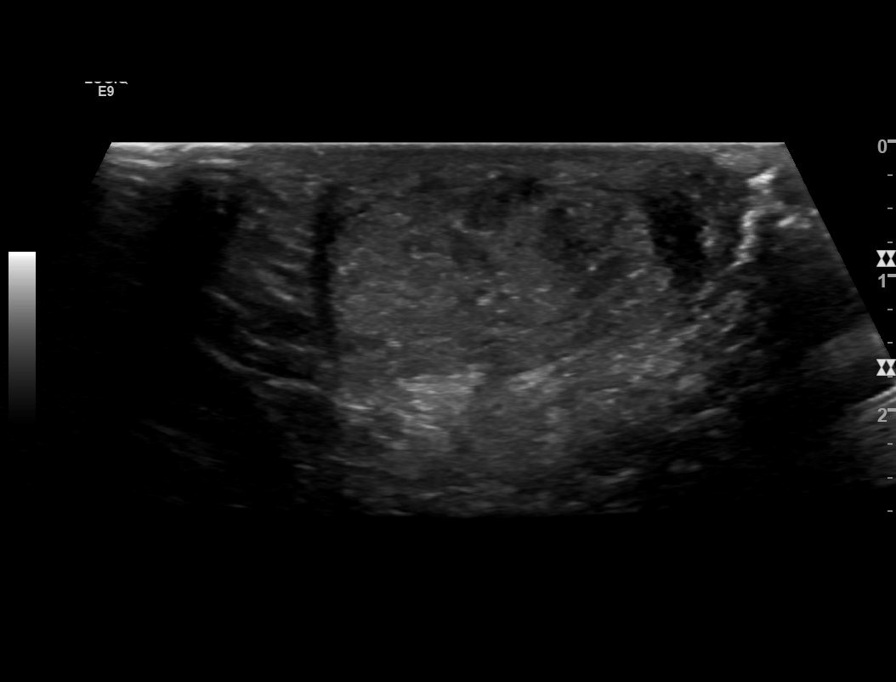

[9 of 9 positions shown; findings below may reference images not displayed]

FINDINGS: Subcutaneous oval-shaped hypoechoic mass lesion at area of clinical concern with internal blood flow identified up to 28 x 14 x 23 mm. No other adjacent mass or cyst is seen.
IMPRESSION: 28 x 14 x 23 mm subcutaneous ovoid hypoechoic heterogeneous vascular lesion at area of clinical concern as noted. Differential diagnoses include schwannoma or other mass lesion. MRI of left elbow without and with contrast study is advised for further evaluation.

IMPORTANT ABNORMAL FINDINGS!

## 2020-01-09 ENCOUNTER — Encounter (INDEPENDENT_AMBULATORY_CARE_PROVIDER_SITE_OTHER): Payer: Self-pay | Admitting: Physician Assistant

## 2020-01-10 IMAGING — MG MAMMO SCRN BIL W/CAD TOMO
8 series · 8 of 24 positions shown · non-contrast
Comparison: The present examination has been compared to prior imaging studies.

Images Obtained from [facility]
INDICATION: Screening.
TECHNIQUE: Bilateral 2-D digital screening mammogram was performed followed by 3-D tomosynthesis.  Current study was also evaluated with a computer aided detection (CAD) system.
MAMMOGRAM FINDINGS:
There are scattered areas of fibroglandular density.
No suspicious abnormality is seen in either breast.  There are no significant changes from the prior study.

[L MLO]
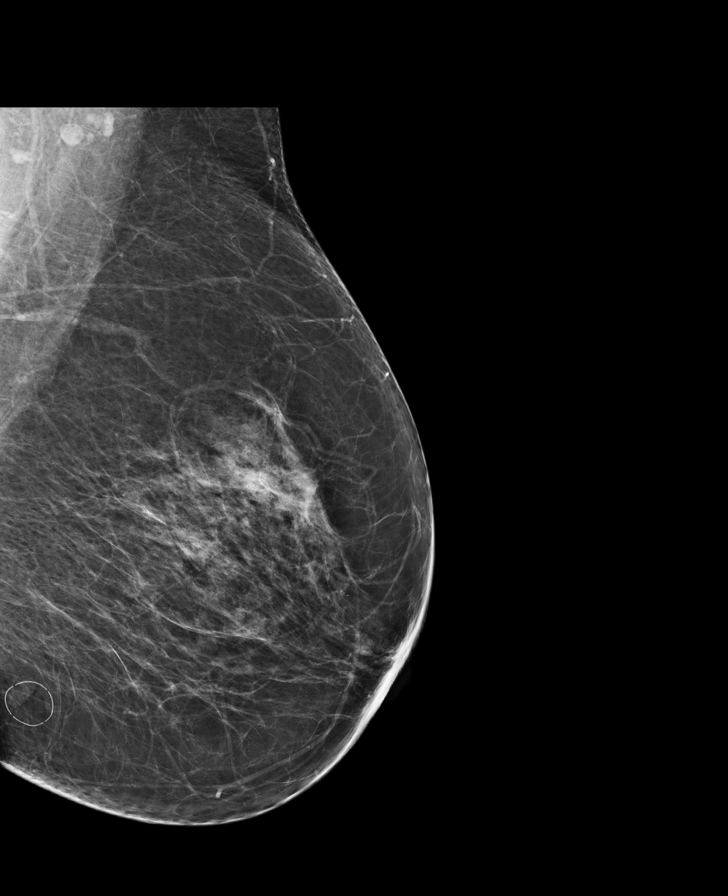

[L CC]
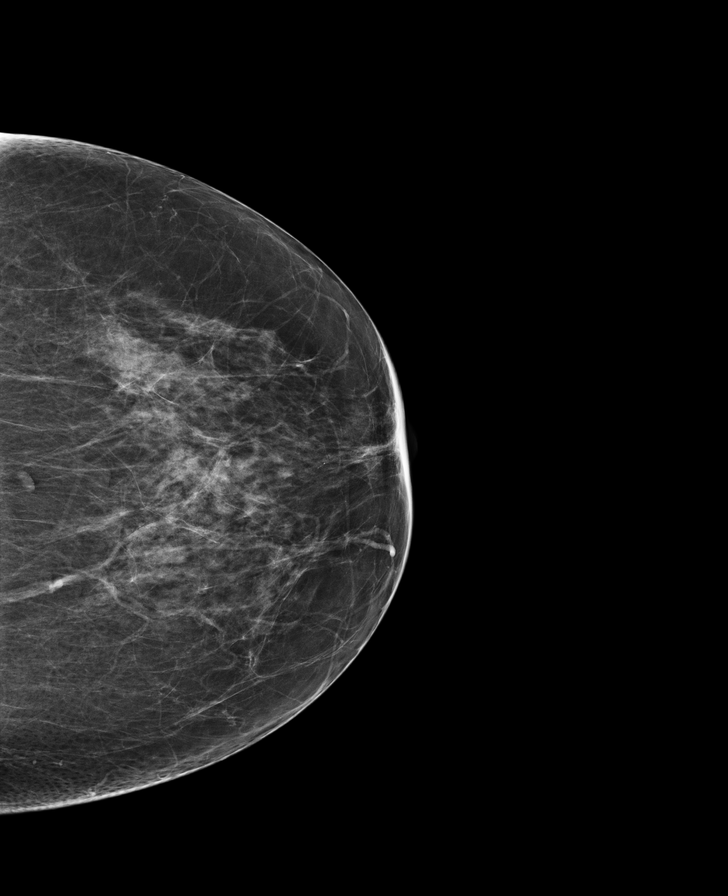

[R MLO]
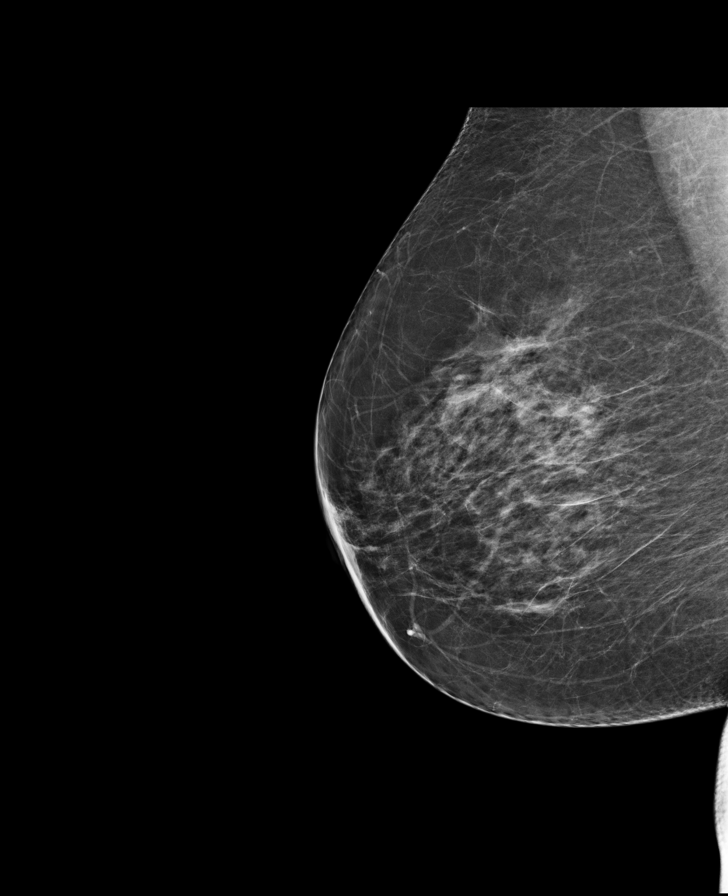

[R CC]
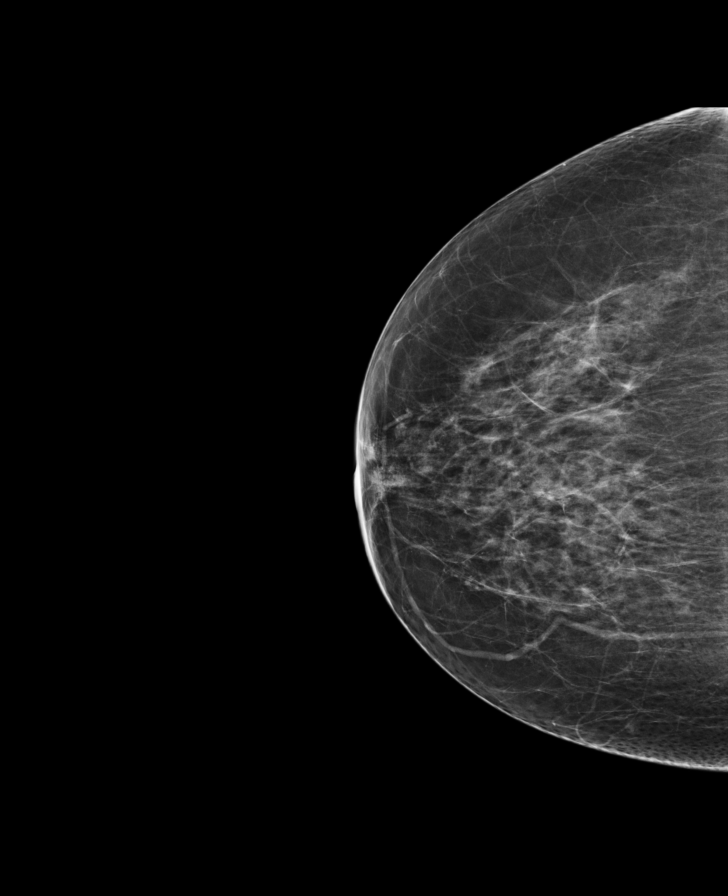

[R CC tomo · tomo slice 35/68.0]
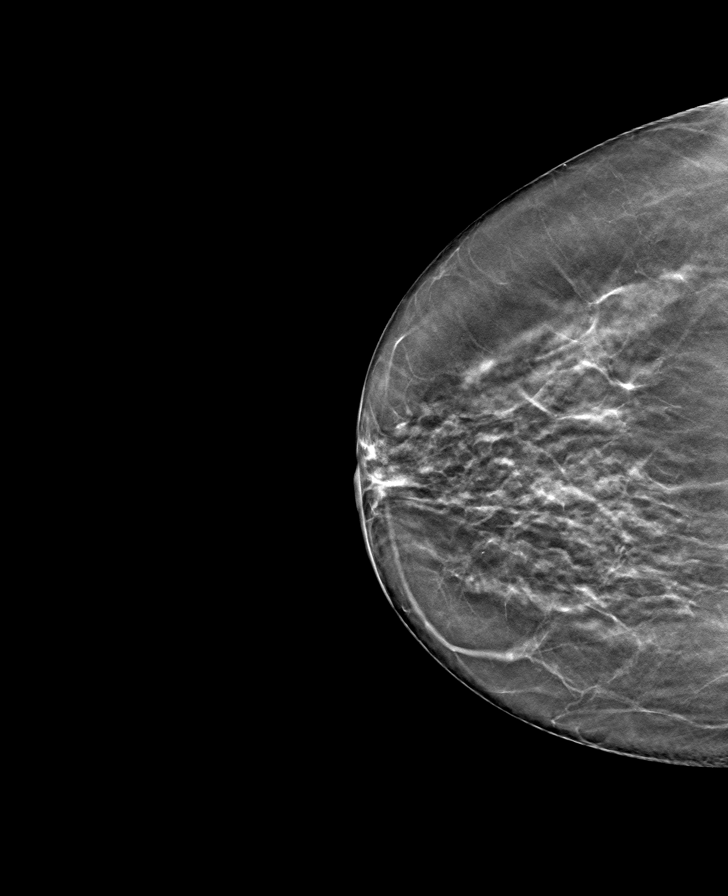

[L MLO tomo · tomo slice 37/74.0]
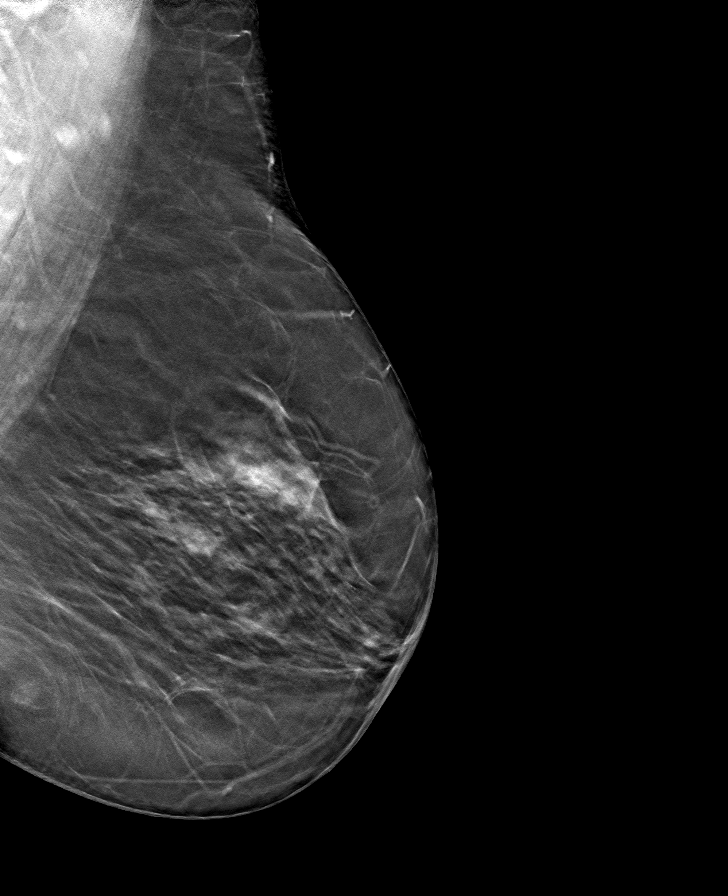

[R MLO tomo · tomo slice 35/70.0]
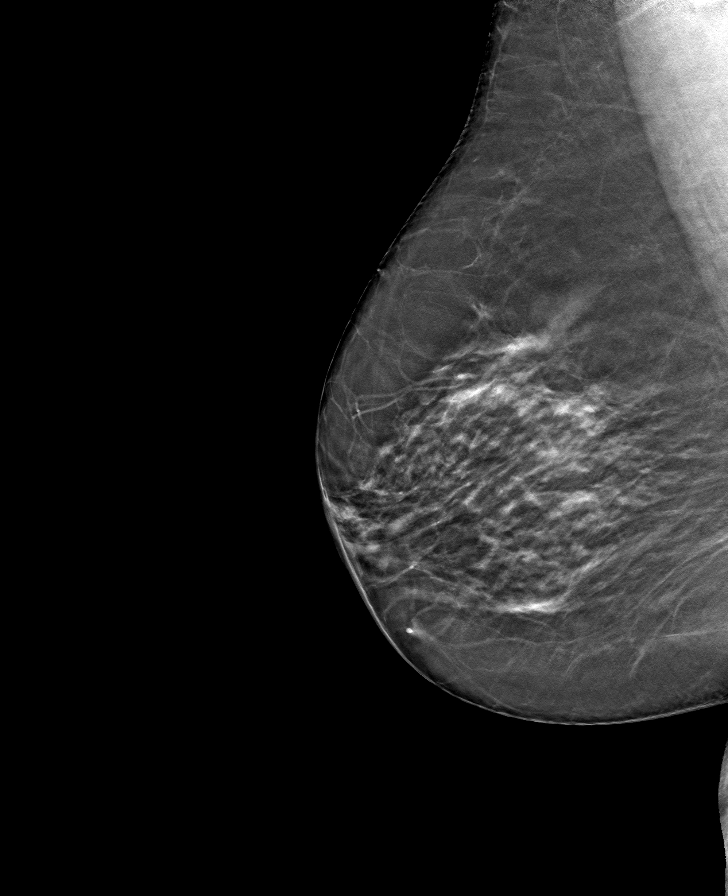

[L CC tomo · tomo slice 33/66.0]
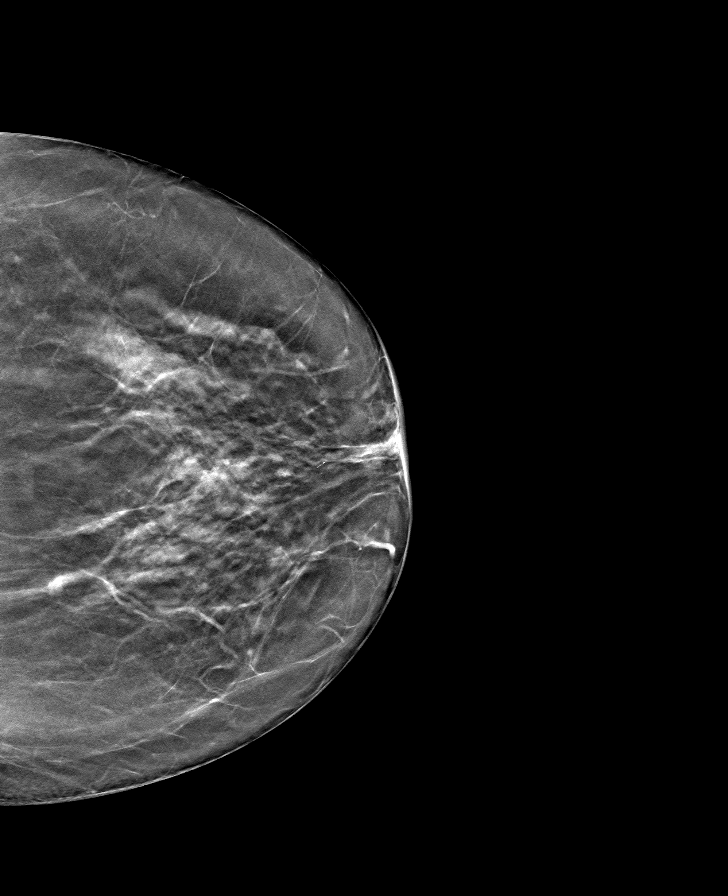

[8 of 24 positions shown; findings below may reference images not displayed]

IMPRESSION: There is no mammographic evidence of malignancy.
Screening mammogram recommended in 1 year.
BI-RADS Category 1: Negative

## 2020-01-14 ENCOUNTER — Telehealth (INDEPENDENT_AMBULATORY_CARE_PROVIDER_SITE_OTHER): Admitting: Physician Assistant

## 2020-01-14 ENCOUNTER — Encounter (INDEPENDENT_AMBULATORY_CARE_PROVIDER_SITE_OTHER): Payer: Self-pay | Admitting: Physician Assistant

## 2020-01-14 VITALS — Ht 62.0 in | Wt 140.0 lb

## 2020-01-14 MED ORDER — HYDROCODONE-ACETAMINOPHEN 5-325 MG OR TABS
1.0000 | ORAL_TABLET | ORAL | Status: DC
Start: 2020-01-13 — End: 2020-05-13

## 2020-01-15 ENCOUNTER — Telehealth (INDEPENDENT_AMBULATORY_CARE_PROVIDER_SITE_OTHER): Payer: Self-pay | Admitting: Physician Assistant

## 2020-01-16 ENCOUNTER — Encounter (INDEPENDENT_AMBULATORY_CARE_PROVIDER_SITE_OTHER): Payer: Self-pay

## 2020-01-24 MED ORDER — MELOXICAM 15 MG TAB
15 mg | ORAL_TABLET | ORAL | 3 refills | Status: DC
Start: 2020-01-24 — End: 2020-10-23

## 2020-01-25 ENCOUNTER — Other Ambulatory Visit (INDEPENDENT_AMBULATORY_CARE_PROVIDER_SITE_OTHER): Payer: Self-pay | Admitting: Physician Assistant

## 2020-01-27 MED ORDER — CETIRIZINE HCL 10 MG OR TABS
ORAL_TABLET | ORAL | 1 refills | Status: DC
Start: 2020-01-27 — End: 2020-05-13

## 2020-02-04 ENCOUNTER — Ambulatory Visit: Attending: Internal Medicine | Primary: Internal Medicine

## 2020-02-04 ENCOUNTER — Encounter

## 2020-02-04 ENCOUNTER — Ambulatory Visit: Admit: 2020-02-04 | Discharge: 2020-02-04 | Payer: MEDICARE | Attending: Internal Medicine | Primary: Internal Medicine

## 2020-02-04 DIAGNOSIS — J4540 Moderate persistent asthma, uncomplicated: Secondary | ICD-10-CM

## 2020-02-04 MED ORDER — PANTOPRAZOLE 40 MG TAB, DELAYED RELEASE
40 mg | ORAL_TABLET | Freq: Every day | ORAL | 4 refills | Status: DC
Start: 2020-02-04 — End: 2020-10-27

## 2020-02-04 NOTE — Progress Notes (Signed)
[name]   [address]  [place] [address]  [phone]    SUBSEQUENT MEDICARE ANNUAL WELLNESS VISIT       CHIEF COMPLAINT     Rhonda [name], 74 y.o. female, presents for Annual Wellness Visit, GERD, Hypertension, and Sleep Apnea.     HPI   Rhonda [name] is here for MWV and follow up of GERD,Hypertension and OSA.  She states over the past few months she has had right  Lumbar pain with some radiation down her right buttocks. Most noticeable when she is walking. Denies any leg weakness or tingling.   H/o LS back surgeries in the past.   She also states she has occasional reflux symptoms. She feels buring sensation behind her chest after a meal. She does not eat any spices and has occasional periods when she does not feel anything.   Denies any weight loss or change in bowel habits.   GERD symptoms are stable on famotidine.   Weight is stable.   Eye exam is uptodate.  Breathing is stable.   Sleep is stable on CPAP.    OBJECTIVE     Visit Vitals  BP 129/73 (BP 1 Location: Right upper arm, BP Patient Position: Sitting, BP Cuff Size: Adult)   Pulse 74   Resp 16   Ht 5' 5" (1.651 m)   Wt 184 lb (83.5 kg)   BMI 30.62 kg/m??      BP Readings from Last 3 Encounters:   02/04/20 129/73   07/11/19 130/80   11/15/18 121/68      Wt Readings from Last 3 Encounters:   02/04/20 184 lb (83.5 kg)   07/10/19 190 lb (86.2 kg)   11/15/18 192 lb 12.8 oz (87.5 kg)     Physical Exam  Vitals signs and nursing note reviewed.   Eyes:      General: No scleral icterus.     Conjunctiva/sclera: Conjunctivae normal.   Cardiovascular:      Rate and Rhythm: Normal rate and regular rhythm.      Heart sounds: Normal heart sounds. No murmur. No friction rub. No gallop.    Pulmonary:      Breath sounds: Normal breath sounds. No wheezing or rhonchi.   Abdominal:      General: Bowel sounds are normal.      Palpations: Abdomen is soft.      Tenderness: There is no abdominal tenderness.      Comments: No epigastric tenderness    Musculoskeletal:      Right lower leg: No edema.      Left lower leg: No edema.      Comments: LS spine: no point tenderness  Tenderness over the right SI joint  SLR test: -ve bilaterally  Motor: 5/5 LE  Sensory: intact     Skin:     Findings: No rash.   Neurological:      General: No focal deficit present.      Mental Status: She is oriented to person, place, and time. Mental status is at baseline.   Psychiatric:         Mood and Affect: Mood normal.         Behavior: Behavior normal.         Review of Systems   Constitutional: Negative for chills and fever.   HENT: Negative for sore throat.    Respiratory: Negative for cough, shortness of breath and wheezing.    Cardiovascular: Negative for chest pain, palpitations and leg   swelling.   Gastrointestinal: Positive for heartburn. Negative for abdominal pain, nausea and vomiting.   Genitourinary: Negative for dysuria.   Musculoskeletal: Positive for back pain.   Neurological: Negative for dizziness, loss of consciousness and headaches.   Psychiatric/Behavioral: The patient is not nervous/anxious.      ASSESSMENT AND PLAN     Diagnoses and all orders for this visit:    1. Moderate persistent asthma without complication  Breathing is stable.    2. Obstructive sleep apnea syndrome  Continue CPAP    3. GERD without esophagitis  Symptoms suspicious for acute exacerbation on GERD. Will trial pantoprazole and see hoe she responds. She will discontinue famotidine.-     pantoprazole (PROTONIX) 40 mg tablet; Take 1 Tab by mouth daily.    4. Screening breast examination  Mammogram due     5. Preventative health care  Colonoscopy uptodate on 08/23/2016,next in 10 years  -     CBC WITH AUTOMATED DIFF; Future  -     METABOLIC PANEL, COMPREHENSIVE; Future  -     LIPID PANEL W/ REFLX DIRECT LDL; Future    6. SI (sacroiliac) joint dysfunction  With right SI joint tenderness. Will advise conservative therapy with topical analgesics combined with low back exercises. If symptoms remian  persistent, will consider SI joint injection.       WELLNESS EVALUATION & HEALTH RISK ASSESSMENT     DEPRESSION SCREENING  3 most recent PHQ Screens 02/04/2020   Little interest or pleasure in doing things Not at all   Feeling down, depressed, irritable, or hopeless Not at all   Total Score PHQ 2 0          FALL RISK SCREENING  Fall Risk Assessment, last 12 mths 02/04/2020   Able to walk? Yes   Fall in past 12 months? 0   Do you feel unsteady? 0   Are you worried about falling 0       GENERAL  In general, how would you say your health is?: Good  Do you have a Living Will?: Yes    HEALTH HABITS AND NUTRITION  Do you worry whether your food will run out before you have money to buy more?: No  Have you lost any weight without trying in the past 3 months?: No    HEARING/VISION/SKIN  Do you or your family notice any trouble with your hearing?: No  Do you have difficulty driving, watching TV, or doing any of your daily activities because of your eyesight?: No  Do you have any skin concerns?: No    SLEEP/MEMORY/ANXIETY  Do you have concerns about your sleep?: No  Do you often feel tired, fatigued, or sleepy during the daytime, even after a "good" night's sleep? : No  Do you or a family member have concerns about your memory?: No  Over the last several months, have you been continually worried or anxious about a number of events or activities in your daily life? : No    SUBSTANCE AND OPIOID USE  Do you currently drink alcohol?: No  In the past year, have you used street drugs or prescription medications not prescribed to you?: No  Are you currently using a prescription opioid pain medication such as oxycodone, tramadol, hydrocodone or morphine?: No    SAFETY  Does your home have throw rugs, poor lighting, a slippery bathtub or shower, or clutter in the hallways?: No  Do all of your stairways have a railing or banister?: Yes    Do you have difficulty with balance or walking?: No    ADLS  In the past 7 days, did you need help  from others to perform any of the following everyday activities?: None  In the past 7 days, did you need help from others to take care of any of the following?: None    PHYSICAL ACTIVITY  Are You Physically Active: Yes  Do You Have Difficulty Exercising: No  What Type of Activity Do You Engage In: Other  How Many Days Are You Active Per Week : 5-7     TIMED UP AND GO TEST (If indicated)       MINI-COG SCORE (If indicated)       STOP-BANG SCORE (If indicated)         PREVENTIVE CARE -SCREENINGS      Health Maintenance Topics with due status: Overdue       Topic Date Due    COVID-19 Vaccine Never done    Flu Vaccine 07/10/2019     Health Maintenance Topics with due status: Not Due       Topic Last Completion Date    DTaP/Tdap/Td series 10/03/2015    Colorectal Cancer Screening Combo 08/23/2016    Breast Cancer Screen Mammogram 11/02/2018    Lipid Screen 11/17/2018     Health Maintenance Topics with due status: Completed       Topic Last Completion Date    Bone Densitometry (Dexa) Screening 12/23/2014    Pneumococcal 65+ years 10/03/2015    Hepatitis C Screening 10/03/2017    Shingrix Vaccine Age 50> 11/09/2018         Colon cancer:  Recommendation: Colonoscopy every 10y or annual FIT test from 50-75 or every 3 year stool DNA based test with consideration of ongoing screening from 76-85. and Up to date or Completed    Lung cancer (LDCT): Recommendation: Yearly LDCT for pts 55-77 w 30-pack year hx and currently smoke or quit <15 yr ago. and Not Indicated    Hepatitis C:  Recommendation: One time screening for all patient's aged 18-79. and Not Indicated    Diabetes:   Recommendation: USPSTF recommends screening ages 40-70 y/o if overweight or obese. Medicare covers screening in those patients who are overweight, obese, have HTN or dyslipiemia, a personal history of gestational diabetes or prior elevated blood sugar, a family hx of DM, or any patient over age 65 and Due, ordered    Lipids:   Recommendation: screening for  hyperlipidemia every 5 years after age 45 and Due, ordered    PREVENTIVE CARE - FEMALE SCREENINGS     Cervical cancer: Recommendation: Every 3 yr from 21-29 and every 5 yr from 30-65, with Pap and HPV testing. and Not Indicated    Breast cancer: Recommendation:  USPSTF recommends screening mammography every 2 yr from 50-74. The decision to start screening mammography in women prior to age 50 years should be an individual one. Women who place a higher value on the potential benefit than the potential harms may choose to begin biennial screening between the ages of 40 and 49 years. Medicare covers annual screening mammography, USPSTF also recommends women with a personal or family history of breast, ovarian, tubal, or peritoneal cancer or who have an ancestry (Ashkenazi Jewish) associated with breast cancer susceptibility 1 and 2 (BRCA1/2) gene mutations with an appropriate brief familial risk assessment tool. Women with a positive result on the risk assessment tool should receive genetic counseling and, if indicated after counseling, genetic testing   and mammogram is already scheduled    Osteoporosis: Recommendation: Screen all women at 65, earlier if elevated risk and Not Indicated    AAA:   Recommendation: One-time screening if family history of AAA and Not Indicated    IMMUNIZATIONS     Immunization History   Administered Date(s) Administered   ??? Influenza High Dose Vaccine PF 08/12/2016, 08/13/2016, 08/12/2018   ??? Influenza Vaccine 08/09/2015, 08/17/2017   ??? Influenza Vaccine (Tri) Adjuvanted (>65 Yrs FLUAD TRI 90653) 08/17/2017   ??? Pneumococcal Conjugate (PCV-13) 04/23/2014   ??? Pneumococcal Polysaccharide (PPSV-23) 10/03/2015   ??? Tdap 10/03/2015   ??? Zoster Recombinant 07/17/2018, 11/09/2018       Pneumovax:   Recommendation: PPSV23 once for all >65 and high risk <65  and Up to date or Completed    Prevnar:   Recommendation: PCV13 only if >65 and immunocompromised or residing in a nursing home, or in areas of low  childhood Pneumococcal vaccination and Up to date or Completed    Influenza:   Recommendation: Vaccination annually, high dose if 65 or older and Up to date or Completed    Shingrix:  Recommendation: Vaccination 2 shots 2-6 months apart for all age >50 and Up to date or Completed    TDaP:    Recommendation: Vaccination Booster with TDaP every 10 yr. and Up to date or Completed      INDIVIDUALIZED SCREENING, EDUCATION, AND PLAN     The patient and any caregiver(s) were counseled on: Healthcare maintenance and preventive items as above.    The patient is not on high risk medication(s) including benzodiaepines.    Based on my evaluation of the patient and the Health Risk Assessment performed today, there is not evidence of cognitive impairment.      Medications, allergies, problem list, previous encounters, recent results, medical, social and family history reviewed in the electronic health record. Medications and problems are viewable in the Encounter tab for this visit.    Current Outpatient Medications   Medication Sig   ??? pantoprazole (PROTONIX) 40 mg tablet Take 1 Tab by mouth daily.   ??? meloxicam (MOBIC) 15 mg tablet TAKE 1/2 TABLET BY MOUTH TWICE A DAY   ??? hydroCHLOROthiazide (HYDRODIURIL) 25 mg tablet Take 1 Tab by mouth daily.   ??? fluticasone propion-salmeteroL (Wixela Inhub) 250-50 mcg/dose diskus inhaler Take 1 Puff by inhalation two (2) times a day.   ??? lisinopriL (PRINIVIL, ZESTRIL) 40 mg tablet TAKE 2 TABLETS BY MOUTH ONCE DAILY   ??? ascorbic acid, vitamin C, (VITAMIN C) 250 mg tablet Take  by mouth.   ??? cyanocobalamin (VITAMIN B-12) 1,000 mcg tablet Take 1,000 mcg by mouth daily.   ??? albuterol (PROVENTIL HFA) 90 mcg/actuation inhaler Take 2 Puffs by inhalation every six (6) hours as needed for Wheezing. Inhale 2 puffs four times a day if needed   ??? albuterol-ipratropium (DUO-NEB) 2.5 mg-0.5 mg/3 ml nebu 3 mL by Nebulization route every six (6) hours as needed. Use every 4 hours as needed for dyspena   ???  cpap machine kit Aut[employer]et CPAP 6-10cmH2O.     No current facility-administered medications for this visit.      Medications Discontinued During This Encounter   Medication Reason   ??? famotidine (PEPCID) 20 mg tablet Therapy Completed     Allergies   Allergen Reactions   ??? Cefaclor Not Reported This Time   ??? Methylprednisolone Other (comments)     Pt states that she has a reaction    ???   Prednisone Not Reported This Time     Past Medical History:   Diagnosis Date   ??? Asthma    ??? HTN (hypertension)    ??? Hx of colonoscopy 2005     Past Surgical History:   Procedure Laterality Date   ??? HX CATARACT REMOVAL Left    ??? HX ROTATOR CUFF REPAIR  2007    Dr. Andrezi - [place]   ??? HX TONSILLECTOMY  1952     Family History   Problem Relation Age of Onset   ??? No Known Problems Son    ??? No Known Problems Daughter      Social History     Socioeconomic History   ??? Marital status: MARRIED     Spouse name: Not on file   ??? Number of children: Not on file   ??? Years of education: Not on file   ??? Highest education level: Not on file   Tobacco Use   ??? Smoking status: Never Smoker   ??? Smokeless tobacco: Never Used   Substance and Sexual Activity   ??? Alcohol use: Yes     Comment: Rare social ETOH   ??? 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 Return in about 6 months (around 08/06/2020).       Future Appointments   Date Time Provider Department Center   02/06/2020  8:00 AM [facility] RM 1 [facility] [facility]   08/14/2020  8:00 AM Amelianna Meller P, MD [facility]         Marybeth Dandy P Taevon Aschoff, MD, 02/04/2020  This encounter has been electronically signed

## 2020-02-04 NOTE — Patient Instructions (Addendum)
Personalized Recommendations for Rhonda [name]    Here is a list of your current Health Maintenance items that are due (your personalized list of preventive services)     Health Maintenance Due   Topic Date Due   ??? COVID-19 Vaccine (1) Never done   ??? Yearly Flu Vaccine (1) 07/10/2019         Keep a list of your medications (prescribed and over the counter) and bring it to every appointment.     Taking your medications as prescribed is important for your health and safety. Use this sample medication list to create your own. Update the list whenever changes are made.    Medication Dosage Frequency Indication   Example: Aspirin 81 mg Once daily in the morning Heart Health                                                     How to stay healthy between visits  The best way to live healthy is to have a healthy lifestyle by eating a well-balanced diet, exercising regularly, limiting alcohol and stopping smoking if you are a smoker.    Try to exercise at least 30 minutes a day, this is better than any prescription medicine to keep you healthy.     Eat at least 5 servings of fruits and vegetables every daily and reduce red meats, processed meat (such as deli cold cuts), white breads and pastas. Limit or eliminate sweets and sugary drinks from your diet.    Try to keep your weight healthy. Your body mass index (BMI) should be between 18 and 25. If it is between 51 and 30 you are overweight and if it is 30 or higher, that is called obesity. Being overweight or obese increases risk of high blood pressure, arthritis, sleep apnea, diabetes and many cancers.    High blood pressure causes damage to your blood vessels, heart, kidneys and other organs if untreated. Your blood pressure should be under 140/90 with an ideal level of 120/80 or less to prevent heart disease, strokes and kidney disease.    Wear your seat belt every time you are in a vehicle.     Use sunscreen or cover your skin when you are outdoors. 1 in 23 people  will develop melanoma (skin cancer) which is mostly preventable.    Smoking makes all health problems worse. If you smoke, talk to your provider about stop-smoking programs and medicines.    See a dentist one or two times a year for checkups and to have your teeth cleaned. Annual eye exams are recommended to screen for glaucoma and cataracts.     Immunizations ??? Additional Information  Everyone should have a yearly flu shot and a Tetanus, Diphtheria & Pertussis (TDaP) vaccine every 10 years.    The shingles vaccine called Shingrix is recommended once in a lifetime after age 23. This is given as a 2-dose series and you can get it at your local pharmacy. Shingrix is now recommended instead of the older Zostavax as it is 90% effective compared to 50% for the Zostavax. You can get the Shingrix vaccine even if you had the Zostavax as long as one year has passed.    All people over age 25 should have a pneumonia vaccine to prevent pneumonia. This is called Pneumovax-23 and is once  in a lifetime vaccines except in special cases. Some people may benefit from a different vaccine called Prevnar-13 in addition to Pneumovax-23.      Screening Tests ??? Additional Information  Screening for diabetes mellitus with a blood sugar test should be done annually if you have risk factors such as high blood pressure, high cholesterol, are overweight, have a family history of diabetes or you have had high blood sugars in the past, especially during pregnancy (gestational diabetes).    Cholesterol tests check for elevated lipids (fatty part of blood) which can lead to heart disease and strokes are recommended every 5 years after age 68. If you have elevated cholesterol, diabetes or have had a heart attack or stroke you should have testing more frequently.    Screening for cervical cancer with a pap smear and pelvic exam is recommended for women every 3-5 years from age 1 until age 11 if previous Pap smears have been normal. If you have had  abnormal pap smears or increased risk for cervical cancer then testing may be recommended more frequently.    Breast cancer screening with a mammogram is recommended every 2 years for women age 73-74. More frequent screening has not been shown to reduce the risk of dying from breast cancer and increases the chance you might need a biopsy or get treatment for a breast cancer that would not have harmed you. If you are over 75, mammograms may do more harm than good and there have been no studies showing that screening in this age group reduces the chance of death. Screening between age 88 and 39 does reduce the risk for breast cancer death but is not as effective as it is over age 55 and can lead to more biopsies and false positive tests.    Colon cancer screening that evaluates for blood or polyps in your colon can prevent colon cancer and should be done yearly as a stool test or every 10 years as a colonoscopy up to age 14. Screening can be done up to age 68 if you are still very healthy but has higher risks after age 24. Colonoscopies may be recommended more frequently if precancerous lesions were found.     A bone density test to screen for osteoporosis (thin or brittle bones) should be done at age 92 and periodically after that depending on the results and your history. Medicare will cover this up to every 2 years.    Abdominal Aortic Aneurysm (AAA) screening at 65 is recommended if you have a family history of AAA.    Lung Cancer Screening with an annual low-dose CT scan is recommended if you are between age 63 and 85, smoked at least 30 pack years (the equivalent of 1 pack per day for 30 years), and are a current smoker or quit less than 15 years ago.    Hepatitis C screening is recommended one time for everyone between 18-79.     HIV and syphilis screening are recommended if you are risk.                Personalized Recommendations for Rhonda [name]    Here is a list of your current Health Maintenance items  that are due (your personalized list of preventive services)     Health Maintenance Due   Topic Date Due   ??? COVID-19 Vaccine (1) Never done   ??? Yearly Flu Vaccine (1) 07/10/2019         Keep a list  of your medications (prescribed and over the counter) and bring it to every appointment.     Taking your medications as prescribed is important for your health and safety. Use this sample medication list to create your own. Update the list whenever changes are made.    Medication Dosage Frequency Indication   Example: Aspirin 81 mg Once daily in the morning Heart Health                                                     How to stay healthy between visits  The best way to live healthy is to have a healthy lifestyle by eating a well-balanced diet, exercising regularly, limiting alcohol and stopping smoking if you are a smoker.    Try to exercise at least 30 minutes a day, this is better than any prescription medicine to keep you healthy.     Eat at least 5 servings of fruits and vegetables every daily and reduce red meats, processed meat (such as deli cold cuts), white breads and pastas. Limit or eliminate sweets and sugary drinks from your diet.    Try to keep your weight healthy. Your body mass index (BMI) should be between 18 and 25. If it is between 25 and 30 you are overweight and if it is 30 or higher, that is called obesity. Being overweight or obese increases risk of high blood pressure, arthritis, sleep apnea, diabetes and many cancers.    High blood pressure causes damage to your blood vessels, heart, kidneys and other organs if untreated. Your blood pressure should be under 140/90 with an ideal level of 120/80 or less to prevent heart disease, strokes and kidney disease.    Wear your seat belt every time you are in a vehicle.     Use sunscreen or cover your skin when you are outdoors. 1 in 60 people will develop melanoma (skin cancer) which is mostly preventable.    Smoking makes all health problems worse. If  you smoke, talk to your provider about stop-smoking programs and medicines.    See a dentist one or two times a year for checkups and to have your teeth cleaned. Annual eye exams are recommended to screen for glaucoma and cataracts.     Immunizations ??? Additional Information  Everyone should have a yearly flu shot and a Tetanus, Diphtheria & Pertussis (TDaP) vaccine every 10 years.    The shingles vaccine called Shingrix is recommended once in a lifetime after age 48. This is given as a 2-dose series and you can get it at your local pharmacy. Shingrix is now recommended instead of the older Zostavax as it is 90% effective compared to 50% for the Zostavax. You can get the Shingrix vaccine even if you had the Zostavax as long as one year has passed.    All people over age 42 should have a pneumonia vaccine to prevent pneumonia. This is called Pneumovax-23 and is once in a lifetime vaccines except in special cases. Some people may benefit from a different vaccine called Prevnar-13 in addition to Pneumovax-23.      Screening Tests ??? Additional Information  Screening for diabetes mellitus with a blood sugar test should be done annually if you have risk factors such as high blood pressure, high cholesterol, are overweight, have a family history of diabetes or  you have had high blood sugars in the past, especially during pregnancy (gestational diabetes).    Cholesterol tests check for elevated lipids (fatty part of blood) which can lead to heart disease and strokes are recommended every 5 years after age 69. If you have elevated cholesterol, diabetes or have had a heart attack or stroke you should have testing more frequently.    Screening for cervical cancer with a pap smear and pelvic exam is recommended for women every 3-5 years from age 40 until age 42 if previous Pap smears have been normal. If you have had abnormal pap smears or increased risk for cervical cancer then testing may be recommended more frequently.     Breast cancer screening with a mammogram is recommended every 2 years for women age 44-74. More frequent screening has not been shown to reduce the risk of dying from breast cancer and increases the chance you might need a biopsy or get treatment for a breast cancer that would not have harmed you. If you are over 75, mammograms may do more harm than good and there have been no studies showing that screening in this age group reduces the chance of death. Screening between age 70 and 64 does reduce the risk for breast cancer death but is not as effective as it is over age 41 and can lead to more biopsies and false positive tests.    Colon cancer screening that evaluates for blood or polyps in your colon can prevent colon cancer and should be done yearly as a stool test or every 10 years as a colonoscopy up to age 75. Screening can be done up to age 52 if you are still very healthy but has higher risks after age 76. Colonoscopies may be recommended more frequently if precancerous lesions were found.     A bone density test to screen for osteoporosis (thin or brittle bones) should be done at age 76 and periodically after that depending on the results and your history. Medicare will cover this up to every 2 years.    Abdominal Aortic Aneurysm (AAA) screening at 65 is recommended if you have a family history of AAA.    Lung Cancer Screening with an annual low-dose CT scan is recommended if you are between age 55 and 71, smoked at least 30 pack years (the equivalent of 1 pack per day for 30 years), and are a current smoker or quit less than 15 years ago.    Hepatitis C screening is recommended one time for everyone between 18-79.     HIV and syphilis screening are recommended if you are risk.

## 2020-02-04 NOTE — Progress Notes (Signed)
[facility]   [place] [address]  [phone]    SUBSEQUENT MEDICARE [place] VISIT       CHIEF COMPLAINT     Rhonda [name], 74 y.o. female, presents for Annual Wellness Visit, GERD, Hypertension, and Sleep Apnea.     HPI   Rhonda [name] is here for [facility] and follow up of GERD,Hypertension and OSA.  She states over the past few months she has had right  Lumbar pain with some radiation down her right buttocks. Most noticeable when she is walking. Denies any leg weakness or tingling.   H/o LS back surgeries in the past.   She also states she has occasional reflux symptoms. She feels buring sensation behind her chest after a meal. She does not eat any spices and has occasional periods when she does not feel anything.   Denies any weight loss or change in bowel habits.   GERD symptoms are stable on famotidine.   Weight is stable.   Eye exam is uptodate.  Breathing is stable.   Sleep is stable on CPAP.    OBJECTIVE     Visit Vitals  BP 129/73 (BP 1 Location: Right upper arm, BP Patient Position: Sitting, BP Cuff Size: Adult)   Pulse 74   Resp 16   Ht '5\' 5"'  (1.651 m)   Wt 184 lb (83.5 kg)   BMI 30.62 kg/m??      BP Readings from Last 3 Encounters:   02/04/20 129/73   07/11/19 130/80   11/15/18 121/68      Wt Readings from Last 3 Encounters:   02/04/20 184 lb (83.5 kg)   07/10/19 190 lb (86.2 kg)   11/15/18 192 lb 12.8 oz (87.5 kg)     Physical Exam  Vitals signs and nursing note reviewed.   Eyes:      General: No scleral icterus.     Conjunctiva/sclera: Conjunctivae normal.   Cardiovascular:      Rate and Rhythm: Normal rate and regular rhythm.      Heart sounds: Normal heart sounds. No murmur. No friction rub. No gallop.    Pulmonary:      Breath sounds: Normal breath sounds. No wheezing or rhonchi.   Abdominal:      General: Bowel sounds are normal.      Palpations: Abdomen is soft.      Tenderness: There is no abdominal tenderness.      Comments: No epigastric tenderness    Musculoskeletal:      Right lower leg: No edema.      Left lower leg: No edema.      Comments: LS spine: no point tenderness  Tenderness over the right SI joint  SLR test: -ve bilaterally  Motor: 5/5 LE  Sensory: intact     Skin:     Findings: No rash.   Neurological:      General: No focal deficit present.      Mental Status: She is oriented to person, place, and time. Mental status is at baseline.   Psychiatric:         Mood and Affect: Mood normal.         Behavior: Behavior normal.         Review of Systems   Constitutional: Negative for chills and fever.   HENT: Negative for sore throat.    Respiratory: Negative for cough, shortness of breath and wheezing.    Cardiovascular: Negative for chest pain, palpitations and leg  swelling.   Gastrointestinal: Positive for heartburn. Negative for abdominal pain, nausea and vomiting.   Genitourinary: Negative for dysuria.   Musculoskeletal: Positive for back pain.   Neurological: Negative for dizziness, loss of consciousness and headaches.   Psychiatric/Behavioral: The patient is not nervous/anxious.      ASSESSMENT AND PLAN     Diagnoses and all orders for this visit:    1. Moderate persistent asthma without complication  Breathing is stable.    2. Obstructive sleep apnea syndrome  Continue CPAP    3. GERD without esophagitis  Symptoms suspicious for acute exacerbation on GERD. Will trial pantoprazole and see hoe she responds. She will discontinue famotidine.-     pantoprazole (PROTONIX) 40 mg tablet; Take 1 Tab by mouth daily.    4. Screening breast examination  Mammogram due     5. Preventative health care  Colonoscopy uptodate on 08/23/2016,next in 10 years  -     CBC WITH AUTOMATED DIFF; Future  -     METABOLIC PANEL, COMPREHENSIVE; Future  -     LIPID PANEL W/ REFLX DIRECT LDL; Future    6. SI (sacroiliac) joint dysfunction  With right SI joint tenderness. Will advise conservative therapy with topical analgesics combined with low back exercises. If symptoms remian  persistent, will consider SI joint injection.       WELLNESS EVALUATION & HEALTH RISK ASSESSMENT     DEPRESSION SCREENING  3 most recent [place] Screens 02/04/2020   Little interest or pleasure in doing things Not at all   Feeling down, depressed, irritable, or hopeless Not at all   Total Score PHQ 2 0          FALL RISK SCREENING  Fall Risk Assessment, last 12 mths 02/04/2020   Able to walk? Yes   Fall in past 12 months? 0   Do you feel unsteady? 0   Are you worried about falling 0       GENERAL  In general, how would you say your health is?: Good  Do you have a Living Will?: Yes    [place]  Do you worry whether your food will run out before you have money to buy more?: No  Have you lost any weight without trying in the past 3 months?: No    HEARING/VISION/SKIN  Do you or your family notice any trouble with your hearing?: No  Do you have difficulty driving, watching TV, or doing any of your daily activities because of your eyesight?: No  Do you have any skin concerns?: No    SLEEP/MEMORY/ANXIETY  Do you have concerns about your sleep?: No  Do you often feel tired, fatigued, or sleepy during the daytime, even after a "good" night's sleep? : No  Do you or a family member have concerns about your memory?: No  Over the last several months, have you been continually worried or anxious about a number of events or activities in your daily life? : No    SUBSTANCE AND OPIOID USE  Do you currently drink alcohol?: No  In the past year, have you used street drugs or prescription medications not prescribed to you?: No  Are you currently using a prescription opioid pain medication such as oxycodone, tramadol, hydrocodone or morphine?: No    SAFETY  Does your home have throw rugs, poor lighting, a slippery bathtub or shower, or clutter in the hallways?: No  Do all of your stairways have a railing or banister?: Yes  Do you have difficulty with balance or walking?: No    ADLS  In the past 7 days, did you need help  from others to perform any of the following everyday activities?: None  In the past 7 days, did you need help from others to take care of any of the following?: None    PHYSICAL ACTIVITY  Are You Physically Active: Yes  Do You Have Difficulty Exercising: No  What Type of Activity Do You Engage In: Other  How Many Days Are You Active Per Week : 5-7     TIMED UP AND GO TEST (If indicated)       MINI-COG SCORE (If indicated)       STOP-BANG SCORE (If indicated)         [place] Maintenance Topics with due status: Overdue       Topic Date Due    COVID-19 Vaccine Never done    Flu Vaccine 07/10/2019     Health Maintenance Topics with due status: Not Due       Topic Last Completion Date    DTaP/Tdap/Td series 10/03/2015    Colorectal Cancer Screening Combo 08/23/2016    Breast Cancer Screen Mammogram 11/02/2018    Lipid Screen 11/17/2018     Health Maintenance Topics with due status: Completed       Topic Last Completion Date    Bone Densitometry (Dexa) Screening 12/23/2014    Pneumococcal 65+ years 10/03/2015    Hepatitis C Screening 10/03/2017    Shingrix Vaccine Age 72> 11/09/2018         Colon cancer:  Recommendation: Colonoscopy every 10y or annual FIT test from 50-75 or every 3 year stool DNA based test with consideration of ongoing screening from 76-85. and Up to date or Completed    Lung cancer (LDCT): Recommendation: Yearly LDCT for pts 55-77 w 30-pack year hx and currently smoke or quit <15 yr ago. and Not Indicated    Hepatitis C:  Recommendation: One time screening for all patient's aged 18-79. and Not Indicated    Diabetes:   Recommendation: USPSTF recommends screening ages 68-70 y/o if overweight or obese. Medicare covers screening in those patients who are overweight, obese, have HTN or dyslipiemia, a personal history of gestational diabetes or prior elevated blood sugar, a family hx of DM, or any patient over age 62 and Due, ordered    Lipids:   Recommendation: screening for  hyperlipidemia every 5 years after age 37 and Due, ordered    PREVENTIVE CARE - FEMALE SCREENINGS     Cervical cancer: Recommendation: Every 3 yr from 21-29 and every 5 yr from 61-65, with Pap and HPV testing. and Not Indicated    Breast cancer: Recommendation:  USPSTF recommends screening mammography every 2 yr from 50-74. The decision to start screening mammography in women prior to age 13 years should be an individual one. Women who place a higher value on the potential benefit than the potential harms may choose to begin biennial screening between the ages of 27 and 65 years. Medicare covers annual screening mammography, USPSTF also recommends women with a personal or family history of breast, ovarian, tubal, or peritoneal cancer or who have an ancestry (Ashkenazi Jewish) associated with breast cancer susceptibility 1 and 2 (BRCA1/2) gene mutations with an appropriate brief familial risk assessment tool. Women with a positive result on the risk assessment tool should receive genetic counseling and, if indicated after counseling, genetic testing  and mammogram is already scheduled    Osteoporosis: Recommendation: Screen all women at 65, earlier if elevated risk and Not Indicated    AAA:   Recommendation: One-time screening if family history of AAA and Not Indicated    IMMUNIZATIONS     Immunization History   Administered Date(s) Administered   ??? Influenza High Dose Vaccine PF 08/12/2016, 08/13/2016, 08/12/2018   ??? Influenza Vaccine 08/09/2015, 08/17/2017   ??? Influenza Vaccine (Tri) Adjuvanted (>65 Yrs FLUAD TRI 66599) 08/17/2017   ??? Pneumococcal Conjugate (PCV-13) 04/23/2014   ??? Pneumococcal Polysaccharide (PPSV-23) 10/03/2015   ??? Tdap 10/03/2015   ??? Zoster Recombinant 07/17/2018, 11/09/2018       Pneumovax:   Recommendation: PPSV23 once for all >65 and high risk <65  and Up to date or Completed    Prevnar:   Recommendation: PCV13 only if >65 and immunocompromised or residing in a nursing home, or in areas of low  childhood Pneumococcal vaccination and Up to date or Completed    Influenza:   Recommendation: Vaccination annually, high dose if 65 or older and Up to date or Completed    Shingrix:  Recommendation: Vaccination 2 shots 2-6 months apart for all age >34 and Up to date or Completed    TDaP:    Recommendation: Investment banker, operational with TDaP every 10 yr. and Up to date or Completed      INDIVIDUALIZED SCREENING, EDUCATION, AND PLAN     The patient and any caregiver(s) were counseled on: Healthcare maintenance and preventive items as above.    The patient is not on high risk medication(s) including benzodiaepines.    Based on my evaluation of the patient and the Health Risk Assessment performed today, there is not evidence of cognitive impairment.      Medications, allergies, problem list, previous encounters, recent results, medical, social and family history reviewed in the electronic health record. Medications and problems are viewable in the Encounter tab for this visit.    Current Outpatient Medications   Medication Sig   ??? pantoprazole (PROTONIX) 40 mg tablet Take 1 Tab by mouth daily.   ??? meloxicam (MOBIC) 15 mg tablet TAKE 1/2 TABLET BY MOUTH TWICE A DAY   ??? hydroCHLOROthiazide (HYDRODIURIL) 25 mg tablet Take 1 Tab by mouth daily.   ??? fluticasone propion-salmeteroL (Wixela Inhub) 250-50 mcg/dose diskus inhaler Take 1 Puff by inhalation two (2) times a day.   ??? lisinopriL (PRINIVIL, ZESTRIL) 40 mg tablet TAKE 2 TABLETS BY MOUTH ONCE DAILY   ??? ascorbic acid, vitamin C, (VITAMIN C) 250 mg tablet Take  by mouth.   ??? cyanocobalamin (VITAMIN B-12) 1,000 mcg tablet Take 1,000 mcg by mouth daily.   ??? albuterol (PROVENTIL HFA) 90 mcg/actuation inhaler Take 2 Puffs by inhalation every six (6) hours as needed for Wheezing. Inhale 2 puffs four times a day if needed   ??? albuterol-ipratropium (DUO-NEB) 2.5 mg-0.5 mg/3 ml nebu 3 mL by Nebulization route every six (6) hours as needed. Use every 4 hours as needed for dyspena   ???  cpap machine kit Autoset CPAP 6-10cmH2O.     No current facility-administered medications for this visit.      Medications Discontinued During This Encounter   Medication Reason   ??? famotidine (PEPCID) 20 mg tablet Therapy Completed     Allergies   Allergen Reactions   ??? Cefaclor Not Reported This Time   ??? Methylprednisolone Other (comments)     Pt states that she has a reaction    ???  Prednisone Not Reported This Time     Past Medical History:   Diagnosis Date   ??? Asthma    ??? HTN (hypertension)    ??? Hx of colonoscopy 2005     Past Surgical History:   Procedure Laterality Date   ??? HX CATARACT REMOVAL Left    ??? HX ROTATOR CUFF REPAIR  2007    Dr. Mickie Bail - [place]   ??? HX TONSILLECTOMY  1952     Family History   Problem Relation Age of Onset   ??? No Known Problems Son    ??? No Known Problems Daughter      Social History     Socioeconomic History   ??? Marital status: MARRIED     Spouse name: Not on file   ??? Number of children: Not on file   ??? Years of education: Not on file   ??? Highest education level: Not on file   Tobacco Use   ??? Smoking status: Never Smoker   ??? Smokeless tobacco: Never Used   Substance and Sexual Activity   ??? Alcohol use: Yes     Comment: Rare social ETOH   ??? 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 Return in about 6 months (around 08/06/2020).       Future Appointments   Date Time Provider [place]   02/06/2020  8:00 AM [facility] RM 1 [facility]   08/14/2020  8:00 AM Jerilee Field, MD [facility]         Jerilee Field, MD, 02/04/2020  This encounter has been electronically signed

## 2020-02-06 ENCOUNTER — Encounter

## 2020-02-06 ENCOUNTER — Inpatient Hospital Stay: Admit: 2020-02-06 | Payer: MEDICARE | Attending: Internal Medicine | Primary: Internal Medicine

## 2020-02-06 DIAGNOSIS — Z1231 Encounter for screening mammogram for malignant neoplasm of breast: Secondary | ICD-10-CM

## 2020-02-06 LAB — COMPREHENSIVE METABOLIC PANEL
ALT: 16 IU/L (ref 7–52)
AST: 19 IU/L (ref 13–39)
Albumin: 4.1 g/dL (ref 3.5–5.7)
Alkaline Phosphatase: 62 IU/L (ref 34–104)
Anion Gap: 5 mEq/L (ref 3–11)
BUN: 20 mg/dL (ref 7–25)
CO2: 31 mEq/L (ref 21–31)
Calcium: 9.6 mg/dL (ref 8.6–10.3)
Chloride: 104 mEq/L (ref 98–107)
Creatinine: 1.04 mg/dL (ref 0.60–1.20)
EGFR (CKD-EPI): 53 NA — ABNORMAL LOW (ref >=60)
Glucose: 87 mg/dL (ref 70–120)
Potassium: 4.3 mEq/L (ref 3.5–5.1)
Sodium: 140 mEq/L (ref 136–145)
Total Bilirubin: 0.4 mg/dL (ref 0.2–1.0)
Total Protein: 6.5 g/dL (ref 6.0–8.0)

## 2020-02-06 LAB — CBC WITH AUTO DIFFERENTIAL
Hematocrit: 38.3 % (ref 36.0–47.0)
Hemoglobin: 12.8 g/dL (ref 12.0–16.0)
MCH: 31.4 pg (ref 28.0–34.0)
MCHC: 33.4 g/dL (ref 32.0–36.0)
MCV: 93.9 fL (ref 80.0–100.0)
MPV: 8.9 fL (ref 8.5–12.0)
Platelets: 337 10*3/uL (ref 150–400)
RBC: 4.08 Mil/uL — ABNORMAL LOW (ref 4.20–5.40)
RDW-CV,2213: 12.6 % (ref 11.5–13.5)
RDW-SD: 43.7 fL (ref 35.0–47.0)
WBC: 9.1 10*3/uL (ref 4.8–10.8)

## 2020-02-06 LAB — LIPID PANEL W/ REFLEX DIRECT LDL
Cholesterol, Total: 174 mg/dL (ref 165–199)
HDL: 40 mg/dL (ref 40–91)
LDL Calculated: 111 mg/dL (ref 70–129)
NON HDL CHOL. (LDL+VLDL), 804568: 134 mg/dL (ref <=160)
Triglycerides: 117 mg/dL (ref 50–149)

## 2020-02-06 LAB — DIFFERENTIAL, AUTO.
ABS. BASOPHILS: 0.08 10*3/uL (ref 0.00–0.20)
ABS. IMM. GRANS.: 0.03 10*3/uL (ref 0.00–0.06)
ABS. MONOCYTES: 0.54 10*3/uL (ref 0.10–0.80)
ABS. NEUTROPHILS: 5.96 10*3/uL (ref 1.90–7.80)
Abs Lymphocytes: 2.44 10*3/uL (ref 1.00–4.50)
BASOPHILS: 0.9 % (ref 0.0–2.0)
BRCH EOSINS: 0.7 % (ref 0.0–5.0)
BRCH EOSINS: 0.7 % (ref 0.0–5.0)
BRCH NEUTROPHIL: 65.4 % (ref 45.0–85.0)
Basophils %: 0.9 % (ref 0.0–2.0)
Basophils Absolute: 0.08 10*3/uL (ref 0.00–0.20)
Eos abs-DIF: 0.06 10*3/uL (ref 0.00–0.50)
Eos abs-DIF: 0.06 10*3/uL (ref 0.00–0.50)
Granulocyte Absolute Count: 0.03 10*3/uL (ref 0.00–0.06)
IMMATURE GRANULOCYTES: 0.3 %
Immature Granulocytes: 0.3 %
LYMPHOCYTES: 26.8 % (ref 25.0–45.0)
Lymphocytes %: 26.8 % (ref 25.0–45.0)
Lymphocytes Absolute: 2.44 10*3/uL (ref 1.00–4.50)
MONOCYTES %, TEST14: 5.9 % (ref 1.0–9.0)
MONOCYTES: 5.9 % (ref 1.0–9.0)
Monocytes Absolute: 0.54 10*3/uL (ref 0.10–0.80)
Neutrophil Count, Fluid: 65.4 % (ref 45.0–85.0)
Neutrophils Absolute: 5.96 10*3/uL (ref 1.90–7.80)

## 2020-02-06 LAB — LDL CHOLESTEROL, DIRECT: LDL DIRECT,LDL: 115 mg/dL (ref 70–129)

## 2020-02-06 LAB — LIPID PANEL W/ REFLX DIRECT LDL
Cholesterol, total: 174 mg/dL (ref 165–199)
HDL Cholesterol: 40 mg/dL (ref 40–91)
LDL CHOL, CALCULATED: 111 mg/dL (ref 70–129)
LDL+VLDL: 134 mg/dL (ref <=160)
Triglyceride: 117 mg/dL (ref 50–149)

## 2020-02-06 LAB — METABOLIC PANEL, COMPREHENSIVE
ALT (SGPT): 16 IU/L (ref 7–52)
AST (SGOT): 19 IU/L (ref 13–39)
Albumin: 4.1 g/dL (ref 3.5–5.7)
Alk. phosphatase: 62 IU/L (ref 34–104)
Anion gap: 5 mEq/L (ref 3–11)
BUN: 20 mg/dL (ref 7–25)
Bilirubin, total: 0.4 mg/dL (ref 0.2–1.0)
CO2: 31 mEq/L (ref 21–31)
Calcium: 9.6 mg/dL (ref 8.6–10.3)
Chloride: 104 mEq/L (ref 98–107)
Creatinine: 1.04 mg/dL (ref 0.60–1.20)
Glucose: 87 mg/dL (ref 70–120)
Potassium: 4.3 mEq/L (ref 3.5–5.1)
Protein, total: 6.5 g/dL (ref 6.0–8.0)
Sodium: 140 mEq/L (ref 136–145)
eGFR (CKD-EPI): 53 — ABNORMAL LOW (ref >=60)

## 2020-02-06 LAB — CBC WITH AUTOMATED DIFF
HCT: 38.3 % (ref 36.0–47.0)
HGB: 12.8 g/dL (ref 12.0–16.0)
MCH: 31.4 pg (ref 28.0–34.0)
MCHC: 33.4 g/dL (ref 32.0–36.0)
MCV: 93.9 fL (ref 80.0–100.0)
MEAN PLATELET VOLUME: 8.9 fL (ref 8.5–12.0)
PLATELET: 337 10*3/uL (ref 150–400)
RBC: 4.08 Mil/uL — ABNORMAL LOW (ref 4.20–5.40)
RDW-CV: 12.6 % (ref 11.5–13.5)
RDW-SD: 43.7 fL (ref 35.0–47.0)
WBC: 9.1 10*3/uL (ref 4.8–10.8)

## 2020-02-06 LAB — LDL, DIRECT: LDL,Direct: 115 mg/dL (ref 70–129)

## 2020-03-24 IMAGING — MR MRI ELBOW LT WO/W CONTRAST
10 series · 40 of 40 positions shown · IV contrast (prohance)
Comparison: Ultrasound, 01/04/2020.

INDICATION: Localized swelling, mass and lump, left upper limb
TECHNIQUE: Multiplanar multisequence imaging of the left elbow was performed before and after the intravenous administration of 15 mL of ProHance.

[Series 3: t1_axial · axial · left · 3.0mm · 0.37mm/px · z∈[-80,+20]mm · 4 of 32 slices shown]
[im 1/32]
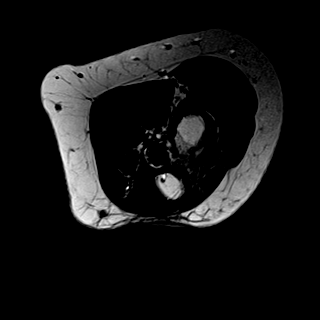
[im 11/32]
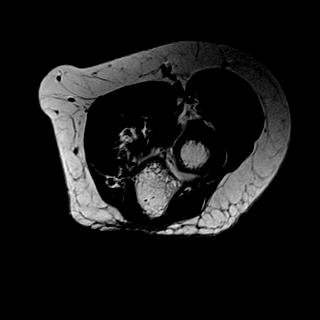
[im 21/32]
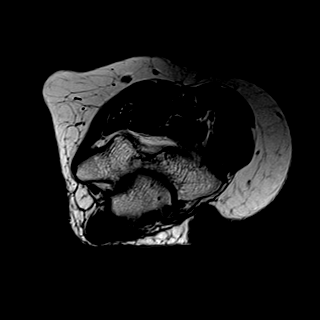
[im 32/32]
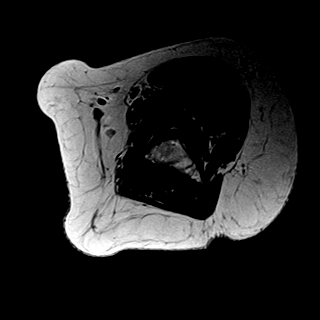

[Series 4: t2_axial_fs · axial · left · 3.0mm · 0.46mm/px · z∈[-80,+20]mm · 5 of 32 slices shown]
[im 1/32]
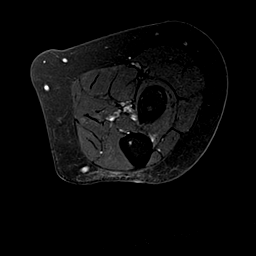
[im 8/32]
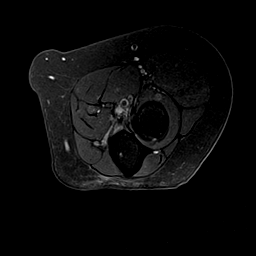
[im 16/32]
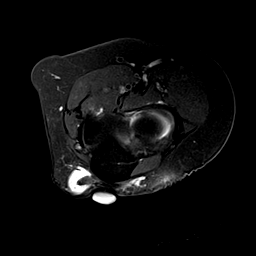
[im 24/32]
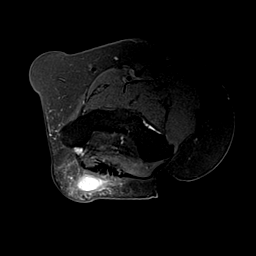
[im 32/32]
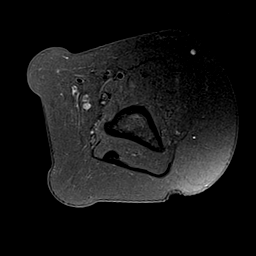

[Series 5: t1_tse_sag · coronal · left · 3.0mm · 0.38mm/px · 4 of 24 slices shown]
[im 1/24]
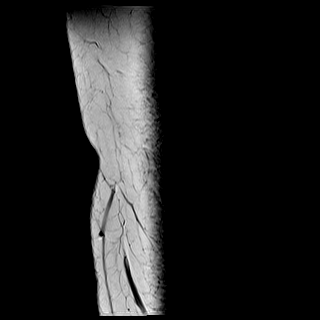
[im 8/24]
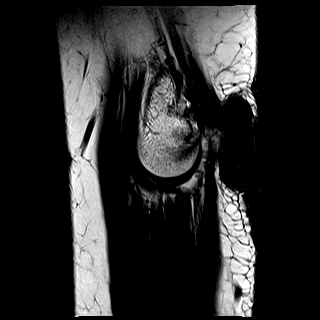
[im 16/24]
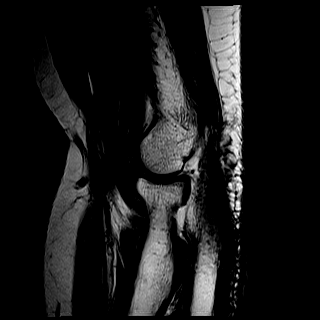
[im 24/24]
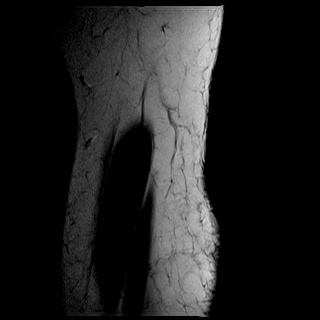

[Series 6: t2_sag_fs · coronal · left · 3.0mm · 0.47mm/px · 4 of 24 slices shown]
[im 1/24]
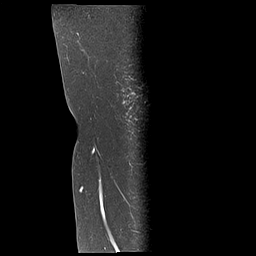
[im 8/24]
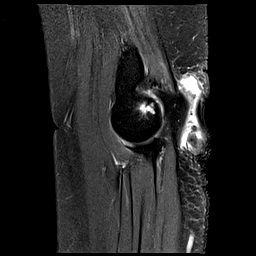
[im 16/24]
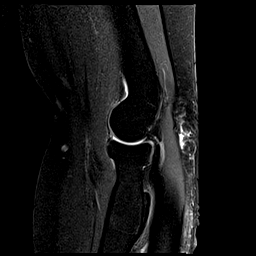
[im 24/24]
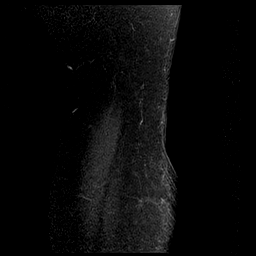

[Series 7: t1_cor · sagittal · left · 3.0mm · 0.39mm/px · 3 of 20 slices shown]
[im 1/20]
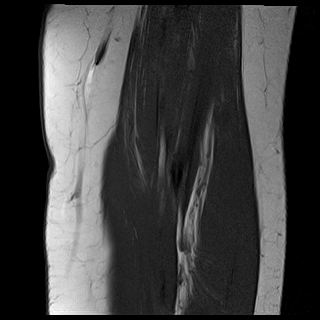
[im 10/20]
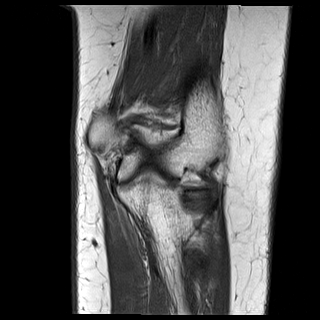
[im 20/20]
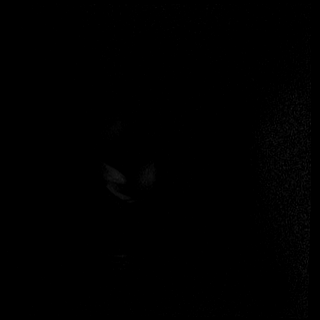

[Series 8: t2_cor_fs · sagittal · left · 3.0mm · 0.23mm/px · 3 of 20 slices shown]
[im 1/20]
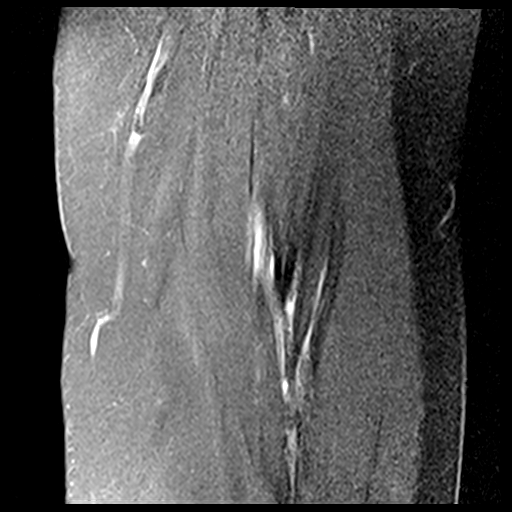
[im 10/20]
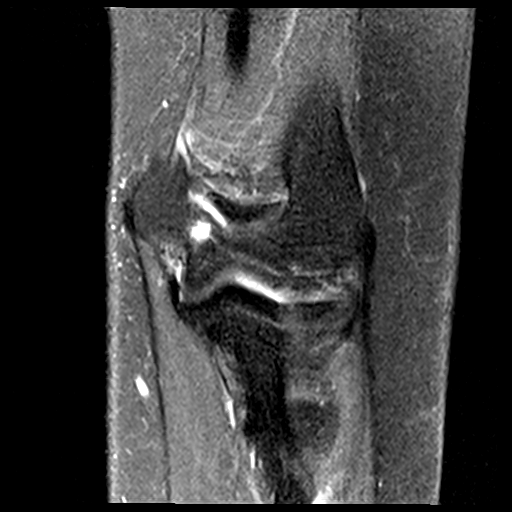
[im 20/20]
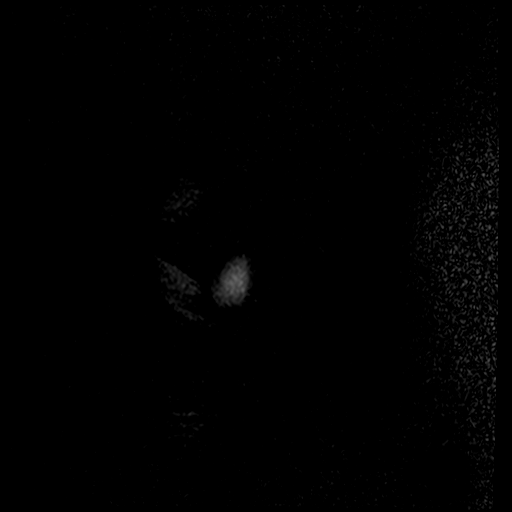

[Series 9: t1_axial_fs · axial · left · 3.0mm · 0.46mm/px · z∈[-80,+20]mm · 5 of 32 slices shown]
[im 1/32]
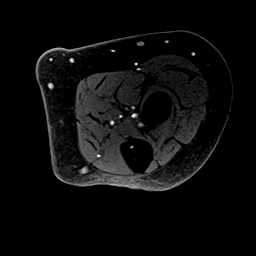
[im 8/32]
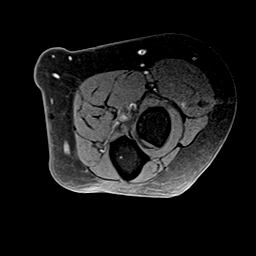
[im 16/32]
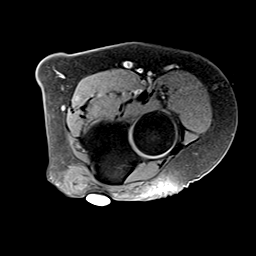
[im 24/32]
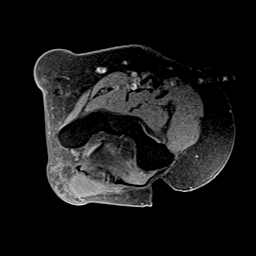
[im 32/32]
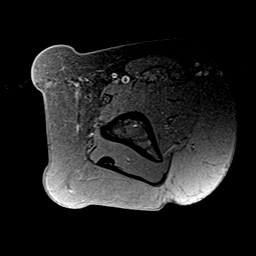

[Series 10: t1_axial_fs +c · axial · left · 3.0mm · 0.46mm/px · z∈[-80,+20]mm · 5 of 32 slices shown]
[im 1/32]
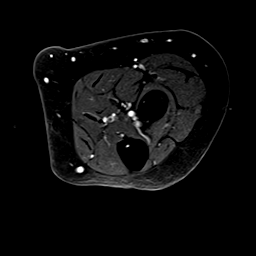
[im 8/32]
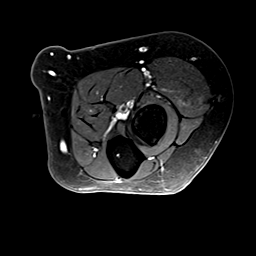
[im 16/32]
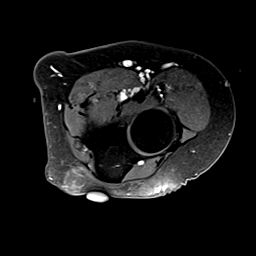
[im 24/32]
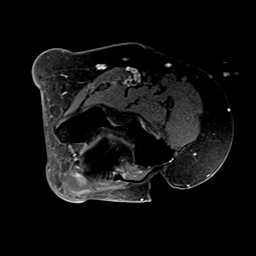
[im 32/32]
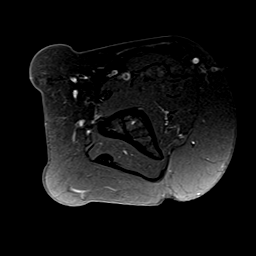

[Series 11: t1_sag_fs +c · coronal · left · 3.0mm · 0.47mm/px · 4 of 24 slices shown]
[im 1/24]
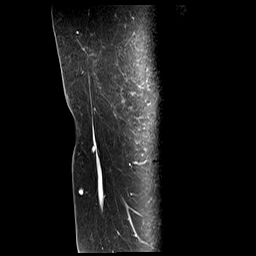
[im 8/24]
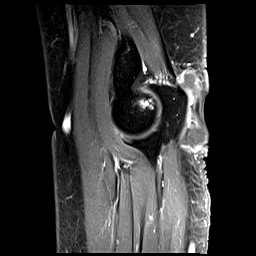
[im 16/24]
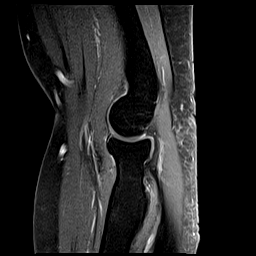
[im 24/24]
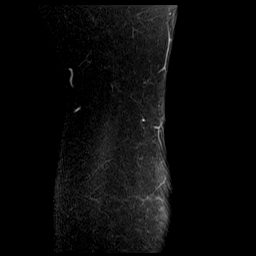

[Series 12: t1_cor_+c · sagittal · left · 3.0mm · 0.50mm/px · 3 of 20 slices shown]
[im 1/20]
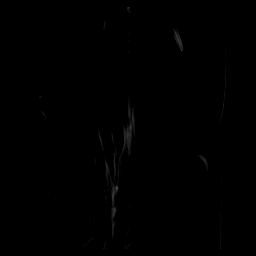
[im 10/20]
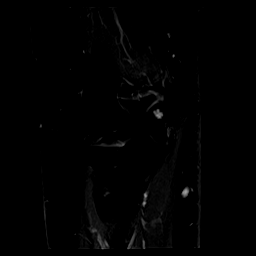
[im 20/20]
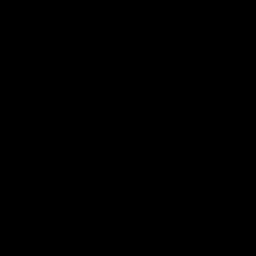

[40 of 40 positions shown; findings below may reference images not displayed]

FINDINGS: Osseous: No acute fracture, avascular necrosis or aggressive osseous lesion.

Ligaments: The ulnar collateral ligament is intact. The radial collateral and lateral ulnar collateral and annular ligaments are intact.

Musculotendinous: The common flexor tendon is intact. Mild common extensor tendon origin tendinosis. The distal biceps and brachialis tendons are intact. The triceps tendon is intact. No intramuscular edema or focal fatty atrophy.

Elbow joint: No joint effusion. No intra-articular bodies. A small region of full-thickness chondrosis is present along the posterior aspect of the distal humerus at the ulnohumeral joint, with underlying subchondral cystic change and marrow edema. The elbow articular cartilage is otherwise maintained.

Other: A 3.5 x 1.8 x 1.3 cm T1 isointense, predominantly T2 hyperintense structure present along the posterior medial aspect of the elbow, with peripheral enhancement and enhancing internal septations. Mild surrounding soft tissue edema. The ulnar nerve is normal in size and signal intensity.
IMPRESSION: 1.
Peripherally enhancing T2 hyperintense structure within the posterior medial aspect of the olecranon, most compatible with olecranon bursitis.

2.
Mild common extensor tendon origin tendinosis.

3.
Small region of full-thickness chondrosis along the posterior medial humerus, along the ulnohumeral joint. Associated underlying subchondral marrow edema.

## 2020-04-24 NOTE — Telephone Encounter (Signed)
Patient dropped off a sealed envelope containing advanced directive paperwork for herself and her spouse. She asked this be given to SP.    Placed in mail holder to deliver to [facility] Ar[facility] Patten when mail is delivered.

## 2020-04-28 NOTE — Telephone Encounter (Signed)
Form completed. Given to Bob.

## 2020-04-28 NOTE — Telephone Encounter (Signed)
Scanned into chart

## 2020-05-13 ENCOUNTER — Encounter (INDEPENDENT_AMBULATORY_CARE_PROVIDER_SITE_OTHER): Payer: Self-pay | Admitting: Physician Assistant

## 2020-05-13 ENCOUNTER — Ambulatory Visit (INDEPENDENT_AMBULATORY_CARE_PROVIDER_SITE_OTHER): Admitting: Physician Assistant

## 2020-05-13 VITALS — BP 128/74 | HR 80 | Temp 97.6°F | Resp 16 | Ht 62.5 in | Wt 146.0 lb

## 2020-05-13 MED ORDER — ONDANSETRON HCL 8 MG OR TABS
8.0000 mg | ORAL_TABLET | Freq: Three times a day (TID) | ORAL | 0 refills | Status: DC | PRN
Start: 2020-05-13 — End: 2020-07-15

## 2020-05-19 MED ORDER — FLUTICASONE-SALMETEROL 250 MCG-50 MCG/DOSE DISK DEVICE FOR INHALATION
250-50 mcg/dose | Freq: Two times a day (BID) | RESPIRATORY_TRACT | 2 refills | Status: DC
Start: 2020-05-19 — End: 2020-11-14

## 2020-05-22 ENCOUNTER — Telehealth (INDEPENDENT_AMBULATORY_CARE_PROVIDER_SITE_OTHER): Payer: Self-pay | Admitting: Physician Assistant

## 2020-05-27 ENCOUNTER — Encounter (INDEPENDENT_AMBULATORY_CARE_PROVIDER_SITE_OTHER): Payer: Self-pay | Admitting: Family Medicine

## 2020-06-05 ENCOUNTER — Encounter

## 2020-06-05 MED ORDER — LISINOPRIL 40 MG TAB
40 mg | ORAL_TABLET | Freq: Every day | ORAL | 3 refills | Status: DC
Start: 2020-06-05 — End: 2020-10-23

## 2020-06-09 ENCOUNTER — Telehealth (INDEPENDENT_AMBULATORY_CARE_PROVIDER_SITE_OTHER): Payer: Self-pay | Admitting: Physician Assistant

## 2020-06-09 DIAGNOSIS — S42401D Unspecified fracture of lower end of right humerus, subsequent encounter for fracture with routine healing: Secondary | ICD-10-CM

## 2020-06-12 ENCOUNTER — Encounter (INDEPENDENT_AMBULATORY_CARE_PROVIDER_SITE_OTHER): Payer: Self-pay | Admitting: Family Medicine

## 2020-06-12 NOTE — Telephone Encounter (Signed)
-----   Message from Jodie I Mackenzie sent at 06/11/2020  1:26 PM EDT -----  Regarding: FW: Non-Urgent Medical Question  Contact: [phone]    ----- Message -----  From: Heron JINNY Daring  Sent: 06/11/2020  10:07 AM EDT  To: Bim Ma  Subject: Non-Urgent Medical Question                      Nilsa Ferraris and I both got a letter stating that our CPAP machines may be faulty.  There was a number to call with the serial numbers on the machines to verify if these that we have were a problem.  Both of them were listed as dangerous and we were to stop using them right away.  The will be replaced (not sure when) in the meantime we were instructed not to use them and to notify our doctor.  Phil doesn't use his much any more, but I use my nightly.  Should I send you a copy of the letter so you could see it?  In the meantime, I don't anticipate problems, but have used it for almost 5 years.  If I notice a difference or difficulty breathing, I'll be in touch.    Thanks, [employer]

## 2020-06-12 NOTE — Telephone Encounter (Signed)
Provider aware of recall, no action at this time.

## 2020-07-01 NOTE — Telephone Encounter (Signed)
Amber from IT is working on this. I've updated patient.

## 2020-07-01 NOTE — Telephone Encounter (Signed)
-----   Message from Harrington Challenger, MD sent at 07/01/2020 11:01 AM EDT -----  Regarding: FW: Non-Urgent Medical Question  Contact: [phone]  Not sure where she is seeing this.  ----- Message -----  From: Lavone Orn  Sent: 07/01/2020   7:52 AM EDT  To: Harrington Challenger, MD  Subject: FW: Non-Urgent Medical Question                  I do not know where the pt is seeing this information  ----- Message -----  From: Eusebio Friendly, CMA  Sent: 06/27/2020   9:11 AM EDT  To: Bim Pod C Ma  Subject: FW: Non-Urgent Medical Question                    ----- Message -----  From: Annye Asa  Sent: 06/27/2020   7:38 AM EDT  To: Bim Ma  Subject: Non-Urgent Medical Question                      Please delete Ellin Goodie M.D. as a dr. for me.  We changed to Dr. Glade Lloyd when we lost confidence in Dr. Juliann Pulse and have no desire to have any further contact with him.  Thanks.  Antonietta Jewel

## 2020-07-08 ENCOUNTER — Encounter (INDEPENDENT_AMBULATORY_CARE_PROVIDER_SITE_OTHER): Payer: Self-pay

## 2020-07-08 NOTE — Telephone Encounter (Signed)
Rhonda [name], what do we do in such situations? Can we ask them to go to a different outfit?

## 2020-07-08 NOTE — Telephone Encounter (Signed)
I spoke with Amber and they are still working on it. I updated patient.

## 2020-07-08 NOTE — Telephone Encounter (Signed)
Thanks Elon Jester. You have always been helpful. Just curious  one thing. Obviously it is a recall on all products but some of my patients have been using them all along with no issues whatsoever. Can I just tell them to use them until the replacement comes? Recall does not mean they have to stop it right away does it?

## 2020-07-08 NOTE — Telephone Encounter (Signed)
-----   Message from Charlie Pitter sent at 07/07/2020  4:09 PM EDT -----  Regarding: FW: Non-Urgent Medical Question  Contact: [phone]    ----- Message -----  From: Annye Asa  Sent: 07/07/2020   1:43 PM EDT  To: Bim Ma  Subject: Non-Urgent Medical Question                      Hello,  We (both Phil and I) received letters from the makers of our CPAP machines that the models we have been using are giving off dangerous particles and  we should no longer be using them. We checked with them on the phone and both our machines were listed as not safe to use. That was about 3 weeks ago.   Today we received a follow up email that says it may be up to a year before the machines are replaced and that we should contact our dr. to see if we should should get a prescription for a new or different machine or just wait and see.  Do you have any suggestions about what we should do next?  I have used mine every night for over 4 years and am a little anxious about what to do next.  Thanks in advance for any advice you can give [place].

## 2020-07-09 NOTE — Telephone Encounter (Signed)
Can you let Gwynne know we do not have any alternatives at the moment. She can continue to use her current machine but continue to touch base with her DME company for updates.

## 2020-07-09 NOTE — Telephone Encounter (Signed)
Patient notified.

## 2020-07-15 ENCOUNTER — Ambulatory Visit (INDEPENDENT_AMBULATORY_CARE_PROVIDER_SITE_OTHER): Admitting: Family Medicine

## 2020-07-15 VITALS — BP 144/80 | HR 106 | Temp 98.0°F | Resp 16 | Ht 62.5 in | Wt 145.0 lb

## 2020-07-15 NOTE — Assessment & Plan Note (Signed)
 Stable  ECG done today, results discussed  Medically cleared for surgery  Will fax today's note, ECG and preop orders to surgeon  Follow up in 2-3 months, sooner as needed.

## 2020-07-15 NOTE — Assessment & Plan Note (Signed)
 Active  DOS: TBD   Surgeon: Dr. Christen Cover   Procedure: right shoulder arthroplasty reversal   ECG done today, results discussed  Medically cleared for surgery  CMP, CBC and UA ordered   No cxr requested   Will fax today's note, ECG and preop orders to surgeon  Follow up in 2-3 months, sooner as needed.

## 2020-07-15 NOTE — Progress Notes (Signed)
($)   ECG      Date/Time: 07/15/2020 11:21 AM  Performed by: Reed Canes, MD  Authorized by: Reed Canes, MD   Interpreted by physician  Rhythm: sinus rhythm  BPM: 81

## 2020-07-16 ENCOUNTER — Telehealth (INDEPENDENT_AMBULATORY_CARE_PROVIDER_SITE_OTHER): Payer: Self-pay | Admitting: Family Medicine

## 2020-07-16 NOTE — Telephone Encounter (Signed)
 Incoming call from pt stating Dr. Benton Brazen did not receive EKG and Clearance form.   EKG/FORM were faxed.     Pt states Dr. Jason Mess office is requesting a clearance form.   I called office and the receptionist stated the OV notes stating pt is cleared would be sufficient.   Notes faxed along with EKG and Form     Per OV notes 07/15/20  ECG done today, results discussed  Medically cleared for surgery

## 2020-07-18 NOTE — Telephone Encounter (Signed)
This was fixed. Pt aware.

## 2020-07-21 ENCOUNTER — Encounter (INDEPENDENT_AMBULATORY_CARE_PROVIDER_SITE_OTHER): Payer: Self-pay

## 2020-07-28 ENCOUNTER — Encounter (INDEPENDENT_AMBULATORY_CARE_PROVIDER_SITE_OTHER): Payer: Self-pay | Admitting: Family Medicine

## 2020-07-28 DIAGNOSIS — N390 Urinary tract infection, site not specified: Secondary | ICD-10-CM

## 2020-07-28 LAB — CBC WITH DIFF, BLOOD
Abs Basophils: 37 cells/uL (ref 0–200)
Abs Eosinophils: 276 cells/uL (ref 15–500)
Abs Lymphs: 3514 cells/uL (ref 850–3900)
Abs Monocytes: 662 cells/uL (ref 200–950)
Abs NRBC: 0 cells/uL
Abs Neutrophils: 4710 cells/uL (ref 1500–7800)
Basophils: 0.4 %
Eosinophils: 3 %
HCT: 44.8 % (ref 35.0–45.0)
HGB: 15.3 g/dL (ref 11.7–15.5)
Lymps: 38.2 %
MCH: 31.8 pg (ref 27.0–33.0)
MCHC: 34.2 g/dL (ref 32.0–36.0)
MCV: 93.1 fL (ref 80.0–100.0)
MPV: 11.6 fL (ref 7.5–12.5)
Monocytes: 7.2 %
PLT: 290 10*3/uL (ref 140–400)
RBC: 4.81 10*6/uL (ref 3.80–5.10)
RDW: 13.6 % (ref 11.0–15.0)
SEGS: 51.2 %
WBC: 9.2 10*3/uL (ref 3.8–10.8)

## 2020-07-28 LAB — URINALYSIS WITH CULTURE REFLEX, WHEN INDICATED
Bilrubin: NEGATIVE
Blood: NEGATIVE
Glucose: NEGATIVE
Hyaline Casts: NONE SEEN /LPF
Ketones: NEGATIVE
Nitrite: POSITIVE — AB
Protein, Urine: NEGATIVE
RBC: NONE SEEN /HPF (ref <=2)
Specific Gravity: 1.02 (ref 1.001–1.035)
pH: 5.5 (ref 5.0–8.0)

## 2020-07-28 LAB — COMPREHENSIVE METABOLIC PANEL, BLOOD
ALT (SGPT): 104 U/L — ABNORMAL HIGH (ref 6–29)
AST (SGOT): 79 U/L — ABNORMAL HIGH (ref 10–35)
Albumin/Glob Ratio: 1.7 (calc) (ref 1.0–2.5)
Albumin: 4.4 g/dL (ref 3.6–5.1)
Alkaline Phos: 81 U/L (ref 37–153)
BUN/Creatinine Ratio: 25 (calc) — ABNORMAL HIGH (ref 6–22)
BUN: 24 mg/dL (ref 7–25)
Bilirubin, Total: 0.7 mg/dL (ref 0.2–1.2)
Calcium: 9.7 mg/dL (ref 8.6–10.4)
Carbon Dioxide: 27 mmol/L (ref 20–32)
Chloride: 103 mmol/L (ref 98–110)
Creatinine: 0.97 mg/dL — ABNORMAL HIGH (ref 0.60–0.93)
Globulin: 2.6 g/dL (calc) (ref 1.9–3.7)
Glucose: 95 mg/dL (ref 65–99)
Potassium: 4.8 mmol/L (ref 3.5–5.3)
Sodium: 139 mmol/L (ref 135–146)
Total Protein: 7 g/dL (ref 6.1–8.1)
eGFR African American: 67 mL/min/{1.73_m2} (ref >=60)
eGFR non-Afr.American: 58 mL/min/{1.73_m2} — ABNORMAL LOW (ref >=60)

## 2020-07-28 MED ORDER — CIPROFLOXACIN HCL 500 MG OR TABS
500.0000 mg | ORAL_TABLET | Freq: Two times a day (BID) | ORAL | 0 refills | Status: DC
Start: 2020-07-28 — End: 2020-09-01

## 2020-07-30 ENCOUNTER — Telehealth (INDEPENDENT_AMBULATORY_CARE_PROVIDER_SITE_OTHER): Admitting: Family

## 2020-07-30 VITALS — Ht 62.52 in | Wt 145.0 lb

## 2020-07-30 NOTE — Assessment & Plan Note (Signed)
 Worsening   Labs as ordered   Ultrasound ordered   Avoidance of hepatotoxic agents discussed   Refer to GI  Monitor

## 2020-08-02 ENCOUNTER — Encounter (INDEPENDENT_AMBULATORY_CARE_PROVIDER_SITE_OTHER): Payer: Self-pay | Admitting: Hospital

## 2020-08-05 ENCOUNTER — Telehealth (INDEPENDENT_AMBULATORY_CARE_PROVIDER_SITE_OTHER): Payer: Self-pay | Admitting: Family

## 2020-08-05 LAB — HIV 1/2 ANTIBODY & P24 ANTIGEN ASSAY, BLOOD: HIV AG/AB, 4th Gen: NONREACTIVE

## 2020-08-05 LAB — HDL-CHOLESTEROL, BLOOD: HDL Cholesterol: 36 mg/dL — ABNORMAL LOW (ref >=50)

## 2020-08-05 LAB — HEPATITIS B SURFACE AG, BLOOD: HepatitiS B Surface Ag: NONREACTIVE

## 2020-08-05 LAB — CHOLESTEROL/HDLC RATIO-QUEST: Chol/HDLC Ratio: 6.7 (calc) — ABNORMAL HIGH (ref <5.0)

## 2020-08-05 LAB — HEPATITIS C AB, BLOOD
Hepatis C AB Signal/Cut Off: 0.03 (ref <1.00)
Hepatitis C Ab: NONREACTIVE

## 2020-08-05 LAB — NON HDL CHOLESTEROL -QUEST: Non-HDL Cholesterol: 206 mg/dL — ABNORMAL HIGH (ref <130)

## 2020-08-05 LAB — LDL CHOLESTEROL, DIRECT: LDL-Cholesterol: 164 mg/dL — ABNORMAL HIGH

## 2020-08-05 LAB — TRIGLYCERIDES, BLOOD: Triglycerides: 247 mg/dL — ABNORMAL HIGH (ref <150)

## 2020-08-05 LAB — CHOLESTEROL, TOTAL BLOOD: Cholesterol: 242 mg/dL — ABNORMAL HIGH (ref <200)

## 2020-08-05 NOTE — Telephone Encounter (Signed)
 Patient got her Hepatitis results thru MyChart and she doesn't want to have an US  and be referred out if they are normal. She is asking for clarification on the results

## 2020-08-07 NOTE — Telephone Encounter (Signed)
 Patient informed of previous message below. Pt agrees to take Simvastatin  as rec. Pt verbalized understanding and agreed.

## 2020-08-07 NOTE — Telephone Encounter (Addendum)
 Due to moderately increasing liver tests, I will recommend ultrasound and evaluation with GI  If she prefers, she can f/u with Dr. Mariam Shingles to discuss     Please notify patient of recent lab results -   Hepatitis screening negative   Cholesterol levels returned moderately elevated. To improve this I recommend avoidance of saturated fats, white breads and pastas, and simple sugars in the diet. Implement more plant based items into diet. Be sure to exercise routinely and maintain a healthy weight. In addition, I recommend starting a cholesterol medication, simvastatin , to improve levels and reduce risk for heart attack or stroke. If patient agreeable I will prescribed.         [name] [name]        The 10-year ASCVD risk score [name] DC Marieta Shorten., et al., 2013) is: 17.1%    Values used to calculate the score:      Age: 28 years      Sex: Female      Is Non-Hispanic African American: No      Diabetic: No      Tobacco smoker: No      Systolic Blood Pressure: 144 mmHg      Is BP treated: No      HDL Cholesterol: 36 mg/dL      Total Cholesterol: 242 mg/dL  Low Risk: 0 - 5 %  Moderate Risk: 5 - 7.5 %  High Risk: 7.5 - 100 %

## 2020-08-08 MED ORDER — SIMVASTATIN 10 MG OR TABS
10.0000 mg | ORAL_TABLET | Freq: Every evening | ORAL | 3 refills | Status: DC
Start: 2020-08-08 — End: 2021-08-12

## 2020-08-08 NOTE — Telephone Encounter (Signed)
Rx sent   Jorge Mandril [name]

## 2020-08-08 NOTE — Addendum Note (Signed)
 Addended by: [name] on: 08/08/2020 09:55 AM     Modules accepted: Orders

## 2020-08-14 ENCOUNTER — Ambulatory Visit: Attending: Internal Medicine | Primary: Internal Medicine

## 2020-08-14 ENCOUNTER — Ambulatory Visit: Admit: 2020-08-14 | Discharge: 2020-08-14 | Payer: MEDICARE | Attending: Internal Medicine | Primary: Internal Medicine

## 2020-08-14 DIAGNOSIS — I10 Essential (primary) hypertension: Secondary | ICD-10-CM

## 2020-08-14 NOTE — Progress Notes (Signed)
[facility]   [place] [address]  [phone]    CHIEF COMPLAINT   [name] is a 74 y.o. female who presents to clinic for GERD, Sleep Apnea, and Hypertension     ASSESSMENT AND PLAN      SUMMARY:      In addition, the below problems were assessed during today's visit and if not discussed above in the Summary above or addressed below are stable based on history, physical exam, review of pertinent labs, studies and medications. Medications have been reconciled. Plan is to continue current medications and have patient follow-up as noted below with me. Orders were placed as noted below.      Diagnoses and all orders for this visit:    1. Hypertension, benign  BP is stable.    2. GERD without esophagitis  Stable on famotidine    3. Obstructive sleep apnea syndrome  She will be getting a replacement shortly.    4. Moderate persistent asthma without complication  Continue inhalers as needed.        HPI     Rhonda [name] is here for follow up.   She is doing well. BP is stable.   GERD symptoms are stable on famotidine.  She is not using CPAP.  Breathing is stable. She uses her inhalers as needed.      Current Outpatient Medications:   ???  lisinopriL (PRINIVIL, ZESTRIL) 40 mg tablet, Take 2 Tablets by mouth daily., Disp: 180 Tablet, Rfl: 3  ???  fluticasone propion-salmeteroL (Wixela Inhub) 250-50 mcg/dose diskus inhaler, Take 1 Puff by inhalation two (2) times a day., Disp: 3 Inhaler, Rfl: 2  ???  pantoprazole (PROTONIX) 40 mg tablet, Take 1 Tab by mouth daily., Disp: 90 Tab, Rfl: 4  ???  meloxicam (MOBIC) 15 mg tablet, TAKE 1/2 TABLET BY MOUTH TWICE A DAY, Disp: 90 Tab, Rfl: 3  ???  hydroCHLOROthiazide (HYDRODIURIL) 25 mg tablet, Take 1 Tab by mouth daily., Disp: 90 Tab, Rfl: 3  ???  ascorbic acid, vitamin C, (VITAMIN C) 250 mg tablet, Take  by mouth., Disp: , Rfl:   ???  cyanocobalamin (VITAMIN B-12) 1,000 mcg tablet, Take 1,000 mcg by mouth daily., Disp: , Rfl:   ???  albuterol (PROVENTIL HFA) 90  mcg/actuation inhaler, Take 2 Puffs by inhalation every six (6) hours as needed for Wheezing. Inhale 2 puffs four times a day if needed, Disp: 3 Inhaler, Rfl: 2  ???  cpap machine kit, Autoset CPAP 6-10cmH2O., Disp: , Rfl:   ???  albuterol-ipratropium (DUO-NEB) 2.5 mg-0.5 mg/3 ml nebu, 3 mL by Nebulization route every six (6) hours as needed. Use every 4 hours as needed for dyspena (Patient not taking: Reported on 08/14/2020), Disp: 30 Nebule, Rfl: 2    There are no discontinued medications.    Active Problem List and Past Medical History reviewed and updated today in the Electronic Health Record.     Allergies   Allergen Reactions   ??? Cefaclor Not Reported This Time   ??? Methylprednisolone Other (comments)     Pt states that she has a reaction    ??? Prednisone Not Reported This Time         REVIEW OF SYSTEMS   Review of Systems   Constitutional: Negative for chills and fever.   Eyes: Negative for blurred vision.   Respiratory: Negative for cough, shortness of breath and wheezing.    Cardiovascular: Negative for chest pain and palpitations.   Gastrointestinal:  Negative for abdominal pain, nausea and vomiting.   Musculoskeletal: Negative for back pain.   Neurological: Negative for dizziness and headaches.   Psychiatric/Behavioral: The patient is not nervous/anxious.          PHYSICAL EXAM     Vitals:    08/14/20 0756   BP: 129/78   Pulse: 77   Resp: 16   Temp: 97.7 ??F (36.5 ??C)   Weight: 190 lb (86.2 kg)   Height: '5\' 5"'$  (1.651 m)     Body mass index is 31.62 kg/m??.  BP Readings from Last 3 Encounters:   08/14/20 129/78   02/04/20 129/73   07/11/19 130/80     Wt Readings from Last 3 Encounters:   08/14/20 190 lb (86.2 kg)   02/04/20 184 lb (83.5 kg)   07/10/19 190 lb (86.2 kg)       Physical Exam  Vitals and nursing note reviewed.   Constitutional:       Appearance: Normal appearance.   Cardiovascular:      Rate and Rhythm: Normal rate and regular rhythm.   Pulmonary:      Breath sounds: Normal breath sounds. No wheezing or  rhonchi.   Abdominal:      Palpations: Abdomen is soft.   Neurological:      Mental Status: She is alert and oriented to person, place, and time. Mental status is at baseline.   Psychiatric:         Mood and Affect: Mood normal.         Behavior: Behavior normal.           LABS/IMAGING   No orders of the defined types were placed in this encounter.      Results from today's visit:    No results found for any visits on 08/14/20.    Most recent labs reviewed as below:              No future appointments.    Jerilee Field, MD  08/14/2020

## 2020-08-20 MED ORDER — ALBUTEROL SULFATE HFA 90 MCG/ACTUATION AEROSOL INHALER
90 mcg/actuation | Freq: Four times a day (QID) | RESPIRATORY_TRACT | 3 refills | Status: AC | PRN
Start: 2020-08-20 — End: ?

## 2020-08-20 NOTE — Telephone Encounter (Signed)
-----   Message from Murrell Converse, [place] sent at 08/18/2020  5:04 PM EDT -----  Regarding: FW: Prescription Question  Contact: [phone]    ----- Message -----  From: Annye Asa  Sent: 08/18/2020   4:40 PM EDT  To: Bim Ma  Subject: Prescription Question                            When I was in the office last week I asked for a refill for my inhaler. The pharmacy-[facility] in [place] hasn't received it as of this afternoon. (Ventolin).  Could someone please check on that... I thought Nadine Counts had taken care of it. .. apparently not.  Thanks.  Rhonda [name] (08/30/1946).

## 2020-08-27 ENCOUNTER — Telehealth (INDEPENDENT_AMBULATORY_CARE_PROVIDER_SITE_OTHER): Payer: Self-pay | Admitting: Family

## 2020-08-27 NOTE — Telephone Encounter (Signed)
-----   Message from [name], NP sent at 08/27/2020  3:44 PM PDT -----  Abdominal ultrasound shows changes suggesting fatty liver   Limit processed foods  Avoid hepatotoxic agents (i.e. alcohol, tylenol )     Due to increasing liver tests, recommend to f/u with GI as referred     [name] [name]

## 2020-08-27 NOTE — Telephone Encounter (Signed)
Patient had been notified

## 2020-09-01 ENCOUNTER — Telehealth

## 2020-09-01 ENCOUNTER — Encounter (INDEPENDENT_AMBULATORY_CARE_PROVIDER_SITE_OTHER): Payer: Self-pay

## 2020-09-01 ENCOUNTER — Ambulatory Visit (INDEPENDENT_AMBULATORY_CARE_PROVIDER_SITE_OTHER): Admitting: Family Medicine

## 2020-09-01 ENCOUNTER — Encounter (INDEPENDENT_AMBULATORY_CARE_PROVIDER_SITE_OTHER): Payer: Self-pay | Admitting: Family Medicine

## 2020-09-01 ENCOUNTER — Encounter

## 2020-09-01 VITALS — BP 126/82 | HR 110 | Temp 97.8°F | Resp 16 | Ht 62.25 in | Wt 146.0 lb

## 2020-09-01 MED ORDER — TRAMADOL HCL 50 MG OR TABS
50.0000 mg | ORAL_TABLET | Freq: Three times a day (TID) | ORAL | 0 refills | Status: DC | PRN
Start: 2020-09-01 — End: 2021-02-18

## 2020-09-01 NOTE — Telephone Encounter (Signed)
Let her know it will have to be a diagnostic mammogram. Please ask her where is the lump?

## 2020-09-01 NOTE — Telephone Encounter (Signed)
Mammogram ordered.

## 2020-09-01 NOTE — Telephone Encounter (Signed)
-----   Message from April M Joy sent at 09/01/2020 11:03 AM EDT -----  Regarding: rt dx mammo  Could you please add an oclock as to where the lump is located to the order.    Thank you

## 2020-09-01 NOTE — Telephone Encounter (Signed)
New mammogram order added.

## 2020-09-01 NOTE — Telephone Encounter (Signed)
-----   Message from [employer] Bohnenkamp sent at 09/01/2020  8:41 AM EDT -----  Regarding: Referral Request  Contact: [phone]  Good morning,  I don't have the date of my last mammogram but would like a referral for one.  I may have found a lump and would like to get it checked.  Thanks, Rhonda [name]

## 2020-09-01 NOTE — Telephone Encounter (Signed)
Right side of right breast, nipple level.

## 2020-09-03 ENCOUNTER — Encounter (INDEPENDENT_AMBULATORY_CARE_PROVIDER_SITE_OTHER): Payer: Self-pay

## 2020-09-12 ENCOUNTER — Inpatient Hospital Stay: Admit: 2020-09-12 | Payer: MEDICARE | Attending: Internal Medicine | Primary: Internal Medicine

## 2020-09-12 ENCOUNTER — Ambulatory Visit: Payer: MEDICARE | Primary: Internal Medicine

## 2020-09-12 ENCOUNTER — Encounter

## 2020-09-12 DIAGNOSIS — N63 Unspecified lump in unspecified breast: Secondary | ICD-10-CM

## 2020-09-21 ENCOUNTER — Other Ambulatory Visit (INDEPENDENT_AMBULATORY_CARE_PROVIDER_SITE_OTHER): Payer: Self-pay | Admitting: Physician Assistant

## 2020-09-21 MED ORDER — TRAZODONE HCL 50 MG OR TABS
ORAL_TABLET | ORAL | 2 refills | Status: DC
Start: 2020-09-21 — End: 2021-06-10

## 2020-09-22 ENCOUNTER — Other Ambulatory Visit (INDEPENDENT_AMBULATORY_CARE_PROVIDER_SITE_OTHER): Payer: Self-pay | Admitting: Physician Assistant

## 2020-09-22 NOTE — Telephone Encounter (Signed)
-----   Message from [employer]. Garceau sent at 09/21/2020  7:59 PM EST -----  Regarding: Covid 19  We were able to get take home tests at [facility] on Sat. and both tested positive.  Other than staying home and napping a lot, we are not sure if there is more we can do.  A lot of coughing, and runny noses.  Would appreciate more info for follow up if there's anything more we can do.  Thanks [name]        Both my husband and I have  very bad chest colds (going on three days).  We have tried to find local testing for Covid, although we hope it isn't the cause.  No self tests/trake home tests are available in [place], or [place].  Can you  tell us where we can get tested?  We'd come to [facility] if that is needed.  Thanks, Rhonda [name] (02/26/1946)and Rhonda [name] ([birth year].

## 2020-09-22 NOTE — Telephone Encounter (Signed)
Patient's husband called back regarding this.     He would like a call back from Dr. Glade Lloyd to see what he recommends.    Please advise  :[phone]

## 2020-09-22 NOTE — Telephone Encounter (Signed)
Please ask them to keep themselves well hydrated, take dayquil for congestion/fever and monitor their symptoms.

## 2020-09-22 NOTE — Telephone Encounter (Signed)
Patient notified.

## 2020-09-23 MED ORDER — DIPHENOXYLATE-ATROPINE 2.5-0.025 MG OR TABS
ORAL_TABLET | ORAL | 0 refills | Status: DC
Start: 2020-09-23 — End: 2021-02-06

## 2020-10-06 NOTE — Telephone Encounter (Signed)
-----   Message from Ritta Slot sent at 10/06/2020  7:34 AM EST -----  Regarding: FW: booster shots for Covid    ----- Message -----  From: Annye Asa  Sent: 10/04/2020   3:53 PM EST  To: Bim Ma  Subject: booster shots for Covid                          We are both feeling better, much less coughing, starting to taste and smell things.  Still tire very easily though.  Wondering about booster shots?  When should we try to get them?  Is there an appropriate waiting time?  Thanks. Rhonda [name]

## 2020-10-06 NOTE — Telephone Encounter (Signed)
Please let Shawn know she can get a booster after 4 weeks from the time of her infection.

## 2020-10-06 NOTE — Telephone Encounter (Signed)
Patient notified.

## 2020-10-23 ENCOUNTER — Telehealth

## 2020-10-23 MED ORDER — MELOXICAM 15 MG TAB
15 mg | ORAL_TABLET | Freq: Two times a day (BID) | ORAL | 3 refills | Status: AC
Start: 2020-10-23 — End: ?

## 2020-10-23 MED ORDER — LISINOPRIL 40 MG TAB
40 mg | ORAL_TABLET | Freq: Every day | ORAL | 3 refills | Status: DC
Start: 2020-10-23 — End: 2021-01-30

## 2020-10-23 NOTE — Telephone Encounter (Signed)
-----   Message from Real Cons sent at 10/22/2020  8:12 AM EST -----  Regarding: FW: Prescriptions    ----- Message -----  From: Annye Asa  Sent: 10/21/2020   4:02 PM EST  To: Bim Ma  Subject: Prescriptions                                    We have asked to have our prescriptions to be switched to [facility] in [place].  This will be our first request from them.  I'm not sure how it works but be no longer want to work with [employer].  I need a renewal for :  Pantoprazole 40 mg tabs/qty 90      RX [phone]     Meloxicam 15 mg tabs  qty 90     [phone]    Lisinopril  40mg  tabs  qty 90     [phone]    Thanks hope it's an easy switch.  Thanks.  [id number]

## 2020-10-23 NOTE — Telephone Encounter (Signed)
Last appt @ PCP Office:  Future Appointments   Date Time Provider Department Center   02/19/2021  8:00 AM Pandey, Sanjay P, MD [facility]       MOST RECENT BLOOD PRESSURES  BP Readings from Last 3 Encounters:   08/14/20 129/78   02/04/20 129/73   07/11/19 130/80        MOST RECENT LAB DATA  Lab Results   Component Value Date/Time    Creatinine 1.04 02/06/2020 09:16 AM    Potassium 4.3 02/06/2020 09:16 AM    ALT (SGPT) 16 02/06/2020 09:16 AM    Cholesterol, total 174 02/06/2020 09:16 AM    HGB 12.8 02/06/2020 09:16 AM    HCT 38.3 02/06/2020 09:16 AM

## 2020-10-27 ENCOUNTER — Telehealth

## 2020-10-27 MED ORDER — PANTOPRAZOLE 40 MG TAB, DELAYED RELEASE
40 mg | ORAL_TABLET | Freq: Every day | ORAL | 4 refills | Status: AC
Start: 2020-10-27 — End: ?

## 2020-10-27 NOTE — Telephone Encounter (Signed)
 We have asked to have our prescriptions to be switched to [facility] in [place].  This will be our first request from them.  I'm not sure how it works but be no longer want to work with [employer].  I need a renewal for :  Pantoprazole  40 mg tabs/qty 90      RX [phone]     Meloxicam  15 mg tabs  qty 90     [phone]    Lisinopril   40mg  tabs  qty 90     [phone]    Thanks hope it's an easy switch.  Thanks.  Heron Daring

## 2020-10-27 NOTE — Telephone Encounter (Signed)
-----   Message from Raeford Razor sent at 10/24/2020 11:58 AM EST -----  Regarding: FW: Prescriptions    ----- Message -----  From: Annye Asa  Sent: 10/24/2020  11:21 AM EST  To: Bim Ma  Subject: Prescriptions                                    I got meloxicam, and lisinopril, but not Pantoprazole, 40 mg tabe/qty 90  RX [phone]. I still have four left, but it has two refills left on it.  Could  someone check on that?  Thanks!  Antonietta Jewel

## 2020-10-29 ENCOUNTER — Encounter (INDEPENDENT_AMBULATORY_CARE_PROVIDER_SITE_OTHER): Payer: Self-pay | Admitting: Physician Assistant

## 2020-10-29 ENCOUNTER — Ambulatory Visit (INDEPENDENT_AMBULATORY_CARE_PROVIDER_SITE_OTHER): Admitting: Physician Assistant

## 2020-10-29 VITALS — BP 130/76 | HR 72 | Temp 97.4°F | Resp 21 | Ht 62.25 in | Wt 150.0 lb

## 2020-10-29 LAB — UA, CHEM ONLY POCT
Bilirubin: NEGATIVE
Blood: NEGATIVE
Glucose: NEGATIVE
Ketones: NEGATIVE
Specific Gravity: 1.02 (ref 1.002–1.030)
pH: 6 (ref 5–8)

## 2020-10-29 MED ORDER — ONDANSETRON 4 MG OR TBDP
4.0000 mg | ORAL_TABLET | Freq: Three times a day (TID) | ORAL | 0 refills | Status: DC | PRN
Start: 2020-10-29 — End: 2021-02-18

## 2020-10-29 MED ORDER — CIPROFLOXACIN HCL 500 MG OR TABS
500.0000 mg | ORAL_TABLET | Freq: Two times a day (BID) | ORAL | 0 refills | Status: DC
Start: 2020-10-29 — End: 2021-02-18

## 2020-10-29 NOTE — Progress Notes (Signed)
 Encounter Date:  10/29/20  11:11 AM   PCP: Adrienne Mocha  MRN: [id number]  DOB: 07-17-1946       HPI:  Kathy Cherry is a 74 year old female presents   Chief Complaint   Patient presents with   . Abdominal Pain     lower abdominal pain x 3 days.  Hx of UTI's x 4 days     Patient presenting with abdominal pain for 3 days.  Pressure and pain at lower abdominal area.  No reports of nausea, vomiting, constipation or diarrhea.  No reports of recent travels or recent dietary changes.  Describes with groin pain and right flank pain.   Had colitis episode early this year that required ED evaluation.  Does feel somewhat similar to pain when she had colitis.  Per ED notes dx with infectious colitis and was provided antibiotics at that time.  Did complete colonoscopy this year, no findings for UC or Crohn's but did show 1 polyp.    Small traces of blood in urine.  No reports of dysuria, urgency or frequency.      PROBLEM  LIST:  Patient Active Problem List   Diagnosis   . GERD (gastroesophageal reflux disease)   . IBS (irritable bowel syndrome)   . Chronic knee pain after total replacement of right knee joint   . Left shoulder pain   . Allergic rhinitis   . History of bilateral breast cancer   . Hyperlipidemia   . Blurred vision, bilateral   . Acute cystitis with hematuria   . Dysuria   . History of recurrent UTIs   . Family history of colon cancer requiring screening colonoscopy   . Atrophic vaginitis   . History of peptic ulcer   . Urinary tract infection without hematuria, site unspecified   . Upper respiratory tract infection, unspecified type   . High cholesterol   . Insomnia, unspecified type   . Dry eye   . Need for hepatitis C screening test   . Annual physical exam   . Breast cancer screening   . Postmenopausal   . Colon cancer screening   . Need for shingles vaccine   . Atherosclerosis of aorta (CMS-HCC)   . Presence of intraocular lens   . Puckering of macula, bilateral   . Presbyopia   . Macular puckering of retina    . Vitreous degeneration   . Tear film insufficiency   . Senile nuclear sclerosis   . Recurrent UTI   . Rash   . UTI symptoms   . Bilateral foot pain   . Bilateral hand pain   . Left knee pain, unspecified chronicity   . Tingling of both feet   . Symptoms consistent with irritable bowel syndrome   . Cough   . Postnasal drip   . Closed fracture of distal end of right humerus with routine healing   . Acute colitis   . Closed fracture of proximal end of right humerus, unspecified fracture morphology, sequela   . Preoperative examination   . Elevated LFTs   . Right shoulder pain, unspecified chronicity   . Need for vaccination   . Abdominal pain, unspecified abdominal location         PAST MEDICAL HISTORY:  Past Medical History:   Diagnosis Date   . Colitis     x 2 09/24/2017 and 10/28/2017         PAST SURGICAL HISTORY:  Past Surgical History:   Procedure Laterality Date   .  SHOULDER OPEN ROTATOR CUFF REPAIR Left 2015   . TOTAL KNEE ARTHROPLASTY  2013   . APPENDECTOMY      74 y/o   . Bleeding Ulcers      74 y/o   . BREAST LUMPECTOMY Left     x 2    . CATARACT EXTRACTION     . LYMPHADENECTOMY Left           FAMILY HISTORY:   Family History   Problem Relation Name Age of Onset   . Breast Cancer Mother     . Diabetes Mother     . Hypertension Mother     . Obesity Mother     . Colon Cancer Father     . Diabetes Father     . Hypertension Father     . Obesity Father     . Breast Cancer Maternal Grandmother     . Breast Cancer Paternal Grandmother           SOCIAL HISTORY:  Social History     Socioeconomic History   . Marital status: Married     Spouse name: Not on file   . Number of children: Not on file   . Years of education: Not on file   . Highest education level: Not on file   Occupational History   . Not on file   Tobacco Use   . Smoking status: Former Smoker     Quit date: 2006     Years since quitting: 15.9   . Smokeless tobacco: Never Used   Substance and Sexual Activity   . Alcohol use: Yes     Alcohol/week: 8.3  standard drinks     Types: 1 Glasses of wine per week     Comment: rare   . Drug use: No   . Sexual activity: Not on file   Other Topics Concern   . Not on file   Social History Narrative   . Not on file     Social Determinants of Health     Financial Resource Strain:    . Difficulty of Paying Living Expenses: Not on file   Food Insecurity:    . Worried About Programme researcher, broadcasting/film/video in the Last Year: Not on file   . Ran Out of Food in the Last Year: Not on file   Transportation Needs:    . Lack of Transportation (Medical): Not on file   . Lack of Transportation (Non-Medical): Not on file   Physical Activity:    . Days of Exercise per Week: Not on file   . Minutes of Exercise per Session: Not on file   Stress:    . Feeling of Stress : Not on file   Social Connections:    . Frequency of Communication with Friends and Family: Not on file   . Frequency of Social Gatherings with Friends and Family: Not on file   . Attends Religious Services: Not on file   . Active Member of Clubs or Organizations: Not on file   . Attends Banker Meetings: Not on file   . Marital Status: Not on file   Intimate Partner Violence:    . Fear of Current or Ex-Partner: Not on file   . Emotionally Abused: Not on file   . Physically Abused: Not on file   . Sexually Abused: Not on file   Housing Stability:    . Unable to Pay for Housing in the Last Year: Not  on file   . Number of Places Lived in the Last Year: Not on file   . Unstable Housing in the Last Year: Not on file     Social History     Tobacco Use   Smoking Status Former Smoker   . Quit date: 2006   . Years since quitting: 15.9   Smokeless Tobacco Never Used      Social History     Substance and Sexual Activity   Alcohol Use Yes   . Alcohol/week: 8.3 standard drinks   . Types: 1 Glasses of wine per week    Comment: rare      Social History     Substance and Sexual Activity   Drug Use No      Immunization History   Administered Date(s) Administered   . COVID-19 Richardo Chandler)  11/22/2019, 12/26/2019   . COVID-19 [employer]) 09/24/2020   . Influenza Vaccine (High Dose) >=65 Years 09/02/2018, 09/02/2018   . Influenza Vaccine >=6 Months 09/01/2020   . Tdap 05/01/2019         CURRENT  MEDICATIONS:  Current Outpatient Medications on File Prior to Visit   Medication Sig Dispense Refill   . CRANBERRY PO      . diphenoxylate-atropine (LOMOTIL) 2.5-0.025 MG tablet TAKE 1 TABLET BY MOUTH 4 TIMES A DAY AS NEEDED FOR DIARRHEA 90 tablet 0   . simvastatin (ZOCOR) 10 MG tablet Take 1 tablet (10 mg) by mouth every evening. 90 tablet 3   . traMADol (ULTRAM) 50 MG tablet Take 1 tablet (50 mg) by mouth every 8 hours as needed for Moderate Pain (Pain Score 4-6). 30 tablet 0   . traZODone (DESYREL) 50 MG tablet TAKE 1 TABLET BY MOUTH NIGHTLY 90 tablet 2     No current facility-administered medications on file prior to visit.     Outpatient Medications Marked as Taking for the 10/29/20 encounter (Office Visit) with Randye Buttner, PA   Medication Sig Dispense Refill   . CRANBERRY PO      . diphenoxylate-atropine (LOMOTIL) 2.5-0.025 MG tablet TAKE 1 TABLET BY MOUTH 4 TIMES A DAY AS NEEDED FOR DIARRHEA 90 tablet 0   . simvastatin (ZOCOR) 10 MG tablet Take 1 tablet (10 mg) by mouth every evening. 90 tablet 3   . traMADol (ULTRAM) 50 MG tablet Take 1 tablet (50 mg) by mouth every 8 hours as needed for Moderate Pain (Pain Score 4-6). 30 tablet 0   . traZODone (DESYREL) 50 MG tablet TAKE 1 TABLET BY MOUTH NIGHTLY 90 tablet 2          ALLERGIES:    Allergies   Allergen Reactions   . Valium [Diazepam] Other     Per pt gets hyper sensitive          REVIEW OF SYSTEMS:  Negative except presented on HPI      PHYSICAL EXAM:   10/29/20  1103   BP: 130/76   Pulse: 72   Resp: 21   Temp: 97.4 F (36.3 C)     Body mass index is 27.22 kg/m.  Ht Readings from Last 1 Encounters:   10/29/20 5' 2.25" (1.581 m)     Wt Readings from Last 1 Encounters:   10/29/20 68 kg (150 lb)       Physical Exam  Constitutional:       Appearance: Normal  appearance.   HENT:      Head: Normocephalic and atraumatic.   Eyes:  Extraocular Movements: Extraocular movements intact.      Pupils: Pupils are equal, round, and reactive to light.   Cardiovascular:      Rate and Rhythm: Normal rate and regular rhythm.      Pulses: Normal pulses.      Heart sounds: Normal heart sounds. No murmur heard.  No friction rub. No gallop.    Pulmonary:      Effort: Pulmonary effort is normal.      Breath sounds: Normal breath sounds. No wheezing, rhonchi or rales.   Musculoskeletal:      Cervical back: Normal range of motion and neck supple.   Skin:     General: Skin is warm and dry.   Neurological:      Mental Status: She is alert and oriented to person, place, and time.   Psychiatric:         Mood and Affect: Mood normal.         Behavior: Behavior normal.         ASSESSMENT & PLAN:  Braiden was seen today for abdominal pain.    Diagnoses and all orders for this visit:    Urinary tract infection without hematuria, site unspecified  Assessment & Plan:  Active  UA positive for infection at this time  Take antibiotics as prescribed  Advised patient to drink plenty of fluids (e.g. cranberry juice)  Take Pyridium otc PRN  Send for Urine C & S  F/u if not better in 3-5 days    Orders:  -     UA, Chem Only (POCT)  -     Urinalysis with Culture Reflex, when indicated  -     ciprofloxacin (CIPRO) 500 MG tablet; Take 1 tablet (500 mg) by mouth 2 times daily.  -     ondansetron (ZOFRAN ODT) 4 MG disintegrating tablet; Take 1 tablet (4 mg) on or under the tongue every 8 hours as needed for Nausea/Vomiting.    Nausea  -     ondansetron (ZOFRAN ODT) 4 MG disintegrating tablet; Take 1 tablet (4 mg) on or under the tongue every 8 hours as needed for Nausea/Vomiting.    Abdominal pain, unspecified abdominal location  Assessment & Plan:  Active  Could be 2/2 colitis.  Patient without any systemic signs of infection and non-toxic appearing on PE  Recommend BRAT diet.  Will do Cipro for UTI as well as  ppx for possible colitis infection.  F/u if no improvement.          ICD-10-CM ICD-9-CM    1. Urinary tract infection without hematuria, site unspecified  N39.0 599.0 UA, Chem Only (POCT)      Urinalysis with Culture Reflex, when indicated      ciprofloxacin (CIPRO) 500 MG tablet      ondansetron (ZOFRAN ODT) 4 MG disintegrating tablet   2. Nausea  R11.0 787.02 ondansetron (ZOFRAN ODT) 4 MG disintegrating tablet   3. Abdominal pain, unspecified abdominal location  R10.9 789.00          Randye Buttner, PA PA-C  [facility]  [email or web address]

## 2020-10-29 NOTE — Assessment & Plan Note (Signed)
 Active  UA positive for infection at this time  Take antibiotics as prescribed  Advised patient to drink plenty of fluids (e.g. cranberry juice)  Take Pyridium otc PRN  Send for Urine C & S  F/u if not better in 3-5 days

## 2020-10-29 NOTE — Assessment & Plan Note (Signed)
 Active  Could be 2/2 colitis.  Patient without any systemic signs of infection and non-toxic appearing on PE  Recommend BRAT diet.  Will do Cipro for UTI as well as ppx for possible colitis infection.  F/u if no improvement.

## 2020-11-01 LAB — URINALYSIS WITH CULTURE REFLEX, WHEN INDICATED

## 2020-11-14 MED ORDER — FLUTICASONE-SALMETEROL 250 MCG-50 MCG/DOSE DISK DEVICE FOR INHALATION
250-50 mcg/dose | Freq: Two times a day (BID) | RESPIRATORY_TRACT | 3 refills | Status: AC
Start: 2020-11-14 — End: ?

## 2020-11-14 NOTE — Telephone Encounter (Signed)
-----   Message from [employer]. [place] sent at 11/14/2020  8:20 AM EST -----  Regarding: refill prescription  I need a refill of Fluticasone Propionate and Salmeterol 272mcg/50mcg   -qty 180-  please send to [facility].  Thanks .  Barb

## 2020-11-14 NOTE — Telephone Encounter (Signed)
Last appt @ PCP Office:  Future Appointments   Date Time Provider Department Center   02/19/2021  8:00 AM Harrington Challenger, MD [facility]       MOST RECENT BLOOD PRESSURES  BP Readings from Last 3 Encounters:   08/14/20 129/78   02/04/20 129/73   07/11/19 130/80        MOST RECENT LAB DATA  Lab Results   Component Value Date/Time    Creatinine 1.04 02/06/2020 09:16 AM    Potassium 4.3 02/06/2020 09:16 AM    ALT (SGPT) 16 02/06/2020 09:16 AM    Cholesterol, total 174 02/06/2020 09:16 AM    HGB 12.8 02/06/2020 09:16 AM    HCT 38.3 02/06/2020 09:16 AM

## 2020-12-02 ENCOUNTER — Encounter (INDEPENDENT_AMBULATORY_CARE_PROVIDER_SITE_OTHER): Payer: Self-pay | Admitting: General Practice

## 2020-12-02 ENCOUNTER — Telehealth (INDEPENDENT_AMBULATORY_CARE_PROVIDER_SITE_OTHER): Admitting: General Practice

## 2020-12-02 VITALS — Ht 62.0 in | Wt 145.0 lb

## 2020-12-02 MED ORDER — HYDROCODONE-ACETAMINOPHEN 10-325 MG OR TABS
ORAL_TABLET | ORAL | Status: DC
Start: 2020-08-13 — End: 2021-02-18

## 2020-12-02 MED ORDER — KETOROLAC TROMETHAMINE 10 MG OR TABS
10.00 mg | ORAL_TABLET | ORAL | Status: DC
Start: 2020-08-13 — End: 2021-02-18

## 2020-12-02 MED ORDER — AMOXICILLIN-POT CLAVULANATE 875-125 MG OR TABS
1.0000 | ORAL_TABLET | Freq: Two times a day (BID) | ORAL | 0 refills | Status: AC
Start: 2020-12-02 — End: 2020-12-09

## 2020-12-02 NOTE — Progress Notes (Signed)
 [facility]  [place] - [place]-  [place] - [place] - [place]   [email or web address] and [email or web address]  Telephone: [phone]                 Cherry Date:  12/02/20  3:16 PM   PCP: Clorinda Daniel   MRN: [id number]  DOB: [birth year]    Kathy Cherry  Pandemic Response Ambulatory Protocol    Evaluator(s):   Kathy Cherry is a 75 year old female  who was evaluated by: Attending Physician (via telemedicine)    Statement:    A Relevant History (including allergies, medications, past medical history, relevant review of systems) and relevant exam as performed by the named provider, are as transcribed in this and/or the accompanying note. Please also see the patient questionnaire for details.    Patient Verification & Video medicine Consent:      -I have verified that the patient's identification to be correct via verbal confirmation of birth date and address & valid: Yes    -The patient, and / or surrogate, has been made aware that patient is to be evaluated today using a home video medicine visit technique (audio, video & digital data transmission): Yes    - The patient, and / or surrogate, has been made aware that patient has the right to refuse this type of evaluation at any time during the assessment period, and has been made aware of any alternatives to this type of evaluation: Yes    -The patient, and / or surrogate,  has been made aware that patient may need further evaluations in the future: Yes    -The patient, and / or surrogate, has signed a valid Informed Consent document (detailing risks, benefits, alternatives & costs), or is exempt from these requirements by law, which I verify is currently present in the [place] MEDICAL RECORD NUMBERYes    Patient is unable to be seen by a specialist in this clinic because:      Pandemic response ambulatory protocol.  Due to COVID-19 pandemic and a federally declared state of public health emergency,  this service is being conducted via video call. Patient is at risk.                    HPI:  Kathy Cherry is a 75 year old female presents at Home for the following:   Chief Complaint   Patient presents with   . Other     c/o sinus sxs x 1 week, pt has been using OTC products      Reports sinus pressure and headache. Denies tender lymph nodes. Experiences some dental pain. Has been trying OTC products with no relief.     PROBLEM  LIST:  Patient Active Problem List   Diagnosis   . GERD (gastroesophageal reflux disease)   . IBS (irritable bowel syndrome)   . Chronic knee pain after total replacement of right knee joint   . Left shoulder pain   . Allergic rhinitis   . History of bilateral breast cancer   . Hyperlipidemia   . Blurred vision, bilateral   . Acute cystitis with hematuria   . Dysuria   . History of recurrent UTIs   . Family history of colon cancer requiring screening colonoscopy   . Atrophic vaginitis   . History of peptic ulcer   . Urinary tract infection without hematuria, site unspecified   . Upper respiratory tract infection, unspecified type   .  High cholesterol   . Insomnia, unspecified type   . Dry eye   . Need for hepatitis C screening test   . Annual physical exam   . Breast cancer screening   . Postmenopausal   . Colon cancer screening   . Need for shingles vaccine   . Atherosclerosis of aorta (CMS-HCC)   . Presence of intraocular lens   . Puckering of macula, bilateral   . Presbyopia   . Macular puckering of retina   . Vitreous degeneration   . Tear film insufficiency   . Senile nuclear sclerosis   . Recurrent UTI   . Rash   . UTI symptoms   . Bilateral foot pain   . Bilateral hand pain   . Left knee pain, unspecified chronicity   . Tingling of both feet   . Symptoms consistent with irritable bowel syndrome   . Cough   . Postnasal drip   . Closed fracture of distal end of right humerus with routine healing   . Acute colitis   . Closed fracture of proximal end of right humerus, unspecified  fracture morphology, sequela   . Preoperative examination   . Elevated LFTs   . Right shoulder pain, unspecified chronicity   . Need for vaccination   . Abdominal pain, unspecified abdominal location   . Sinusitis, unspecified chronicity, unspecified location         CURRENT  MEDICATIONS:  Current Outpatient Medications on File Prior to Visit   Medication Sig Dispense Refill   . ciprofloxacin (CIPRO) 500 MG tablet Take 1 tablet (500 mg) by mouth 2 times daily. 10 tablet 0   . CRANBERRY PO      . diphenoxylate-atropine (LOMOTIL) 2.5-0.025 MG tablet TAKE 1 TABLET BY MOUTH 4 TIMES A DAY AS NEEDED FOR DIARRHEA 90 tablet 0   . HYDROcodone-acetaminophen (NORCO) 10-325 MG tablet   See Instructions, 0.5 - 1 Tabs Oral q4-6H PRN PAIN, 0 Refill(s), Maintenance, 69     . ketOROLAC (TORADOL) 10 MG tablet 10 mg.     . ondansetron (ZOFRAN ODT) 4 MG disintegrating tablet Take 1 tablet (4 mg) on or under the tongue every 8 hours as needed for Nausea/Vomiting. 30 tablet 0   . simvastatin (ZOCOR) 10 MG tablet Take 1 tablet (10 mg) by mouth every evening. 90 tablet 3   . traMADol (ULTRAM) 50 MG tablet Take 1 tablet (50 mg) by mouth every 8 hours as needed for Moderate Pain (Pain Score 4-6). 30 tablet 0   . traZODone (DESYREL) 50 MG tablet TAKE 1 TABLET BY MOUTH NIGHTLY 90 tablet 2     No current facility-administered medications on file prior to visit.     Outpatient Medications Marked as Taking for the 12/02/20 [facility] Telemedicine) with Robby Chime, NP   Medication Sig Dispense Refill   . simvastatin (ZOCOR) 10 MG tablet Take 1 tablet (10 mg) by mouth every evening. 90 tablet 3   . traZODone (DESYREL) 50 MG tablet TAKE 1 TABLET BY MOUTH NIGHTLY 90 tablet 2        ALLERGIES:    Allergies   Allergen Reactions   . Valium [Diazepam] Other     Per pt gets hyper sensitive          REVIEW OF SYSTEMS:  Review of Systems   Constitutional: Negative for chills and fever.   HENT: Positive for sinus pressure. Negative for congestion  and ear pain.    Eyes: Negative for pain and  discharge.   Respiratory: Negative for cough, shortness of breath and wheezing.    Cardiovascular: Negative for chest pain, palpitations and leg swelling.   Gastrointestinal: Negative for abdominal pain, constipation and diarrhea.   Endocrine: Negative for cold intolerance and heat intolerance.   Genitourinary: Negative for dysuria and frequency.   Musculoskeletal: Negative for arthralgias, back pain, gait problem and myalgias.   Neurological: Positive for headaches. Negative for light-headedness.   Psychiatric/Behavioral: Negative for decreased concentration and dysphoric mood. The patient is not nervous/anxious.                       PHYSICAL EXAM:    There were no vitals filed for this visit.  Body mass index is 26.52 kg/m.    Ht Readings from Last 1 Encounters:   12/02/20 5\' 2"  (1.575 m)     Wt Readings from Last 1 Encounters:   12/02/20 65.8 kg (145 lb)         General Impression: Looks healthy, alert, no distress, pleasant affect, cooperative, communicative. Speech within normal limits. No signs of neurological decifits seen.                 ASSESSMENT & PLAN:    Kathy Cherry was seen today for other.    Diagnoses and all orders for this visit:    Sinusitis, unspecified chronicity, unspecified location  Assessment & Plan:  Active  Start on Augmentin 875-125mg  1 tab PO BID x 7 days   Flonase nasal spray use   Rest and hydrate  If sxs worsen or fail to improve, f/u     Orders:  -     amoxicillin-clavulanate (AUGMENTIN) 875-125 MG tablet; Take 1 tablet by mouth 2 times daily for 7 days.        ICD-10-CM ICD-9-CM    1. Sinusitis, unspecified chronicity, unspecified location  J32.9 473.9 amoxicillin-clavulanate (AUGMENTIN) 875-125 MG tablet       Patient instructions:  See EPIC instructions.  Reviewed verbally and/or AVS available via MYchart for patient.      Barriers to learning assessed: None.    Patient/family verbalizes understanding and is agreeable to above  plan.    Patient was evaluated by Robby Chime, [name]    TIME SPENT ON MEDICAL DISCUSSION: 16 min   Start time: 3:10PM  Stop time: 3:26PM      [facility]  [email or web address]      By signing below, I acknowledge that I have reviewed the above note,  dictated by me and scribed by Verlean Glee, for accuracy and edited  where necessary. The note accurately reflects the services provided at this  [facility]. We retain the right to modify this information in the event of  errors. Occasional errors in punctuation, grammar and content may occur.        Electronically reviewed and signed by Robby Chime, [name]

## 2020-12-02 NOTE — Assessment & Plan Note (Signed)
 Active  Start on Augmentin 875-125mg  1 tab PO BID x 7 days   Flonase nasal spray use   Rest and hydrate  If sxs worsen or fail to improve, f/u

## 2020-12-08 ENCOUNTER — Other Ambulatory Visit (INDEPENDENT_AMBULATORY_CARE_PROVIDER_SITE_OTHER): Payer: Self-pay

## 2020-12-22 ENCOUNTER — Telehealth

## 2020-12-22 NOTE — Telephone Encounter (Signed)
-----   Message from Real Cons sent at 12/22/2020  8:21 AM EST -----  Regarding: FW: appointment  asap    ----- Message -----  From: Annye Asa  Sent: 12/21/2020   6:03 PM EST  To: [facility] Ma  Subject: appointment  asap                                I fell and broke my arm Fri. a.m.  -went to [facility] by ambulance...xrays showed a break in the upper humerus,just below the ball.  They put it in a sling and sent me home with a script for acetaminophen-hydrocodone, 2 tabs every six hours for 3 days.  contact you to see if you think and MRI is needed.  Last time this happened I tore my rotator cuff and had surgery in [place].   alternating one pain pill with two tylenol, still having a lot of pain.  Please make me an appt. to see the dr. as soon as possible.  thanks.  Rhonda [name]

## 2020-12-22 NOTE — Telephone Encounter (Signed)
The priority is for patient to see ortho as they are the ones who will perform any interventions. If we could get her in to see ortho tomorrow that would be ideal. Ortho will order the imaging they feel is appropriate.    If patient's pain is not controlled, if she has new onset bruising or pain to another site she needs to go to ED.    Please let patient or her husband know same.

## 2020-12-22 NOTE — Telephone Encounter (Signed)
Name of Caller: Randa Evens     Return number: [id number]    Office note signed: N/A    Referred to location: [facility]    Appointment needed by: ASAP- fracture    Referral not entered, will process as soon as it is in chart

## 2020-12-22 NOTE — Telephone Encounter (Signed)
Let's order an urgent referral to ortho; [facility] if that is ok; she needs assessment by an orthopedic provider - please let them know, cue up order for referral to sign, and try to expedite by contacting referral dept; thanks

## 2020-12-22 NOTE — Telephone Encounter (Signed)
Spoke with pts husband and he thinks she might need more xrays. He said she landed on her elbow and that is all black and blue around it but there were no xrays taken of her elbow. ?should she come up to be seen here in the office to be examined tomorrow.

## 2020-12-22 NOTE — Telephone Encounter (Signed)
signed

## 2020-12-22 NOTE — Telephone Encounter (Signed)
Wisniske, Raynelle Fanning D 1 hour ago (8:32 AM)     JW       Pts husband calling regarding My Chart message.  He stated that pt is not in a cast, as the ED told them that it is too difficult based on the location of the break, close to the shoulder.  He is wondering if more imaging is needed and what the next step is. CB is [phone]

## 2020-12-22 NOTE — Telephone Encounter (Signed)
Order qued up. Referrals notified

## 2020-12-22 NOTE — Telephone Encounter (Signed)
Pts husband calling regarding My Chart message.  He stated that pt is not in a cast, as the ED told them that it is too difficult based on the location of the break, close to the shoulder.  He is wondering if more imaging is needed and what the next step is. CB is [phone]

## 2020-12-22 NOTE — Telephone Encounter (Signed)
Referral done by [facility]

## 2020-12-22 NOTE — Telephone Encounter (Signed)
Hi! [facility] referral is entered as an order, it needs to be entered as a referral, thanks!

## 2020-12-23 ENCOUNTER — Inpatient Hospital Stay
Admit: 2020-12-23 | Discharge: 2020-12-23 | Disposition: A | Discharge status: Home / Self Care | Payer: MEDICARE | Attending: Emergency Medicine

## 2020-12-23 ENCOUNTER — Emergency Department: Admit: 2020-12-23 | Payer: MEDICARE | Primary: Internal Medicine

## 2020-12-23 DIAGNOSIS — S42291A Other displaced fracture of upper end of right humerus, initial encounter for closed fracture: Secondary | ICD-10-CM

## 2020-12-23 NOTE — Telephone Encounter (Signed)
Sounds like she needs further assessment since she is having elbow pain now in addition to shoulder.  If orthopedics is unable to see her by tomorrow AM then should be seen in ED.  She will need additional imaging in reference to the elbow as well.    Let me know if I need to call ED.

## 2020-12-23 NOTE — Telephone Encounter (Signed)
Spoke with pt/husband. Ortho is going to see her but not till 3pm on thur. Pt said she was told to take the narcotic at night and alternate the tylenol and ibuprofen during the day. She said the pain is not bad right now after taking the tylenol and ibuprofen but when she goes to move her arm it is very painful. Even if its just a slight movement. She is sleeping in a recliner. Spoke with Dr Ardath Sax who is covering for Dr Glade Lloyd today and advised she should come to the ER to have additional imaging done. Her elbow is also bruised and painful. This is what she landed on when she fell. She will be coming up to [facility].

## 2020-12-23 NOTE — ED Notes (Signed)
Pt states she fell on Friday and was seen at [facility] ER and found to have a humerus fx. They treated and refered her to her PCP. She was not able to get an appointment with her pcp so appointment was made with [facility]. According to the pt [facility] advised her to come to the ER today because she hadn't been seen by her PCP. There is no change in pain level, swelling, or function of the fractured arm.

## 2020-12-23 NOTE — ED Notes (Signed)
Known right humeral fracture, worsening pain seeing ortho on Thursday.

## 2020-12-23 NOTE — Telephone Encounter (Signed)
Called and left message for michelle at [facility] ortho to see if pt can get in to be seen today.

## 2020-12-23 NOTE — Telephone Encounter (Signed)
Spoke with ortho referral dept and they did get the referral and it is on Dr [employer]. I called and left a message for his secretary to call me back to see if they can see her today. Pt is in a lot of pain. ?if they cannot get her an appt should he bring pt up here to our ER.

## 2020-12-23 NOTE — Telephone Encounter (Signed)
Spoke with Dr. Glenetta Hew in ED, reviewed patients new complaint of elbow pain (was not imaged at [facility] ED).  To be assessed.

## 2020-12-23 NOTE — ED Provider Notes (Addendum)
ED Provider Notes by Carolin GuernseyHildebrand, Mayra Jolliffe M, MD at 12/23/20 1551                Author: Carolin GuernseyHildebrand, Tiffiney Sparrow M, MD  Service: Emergency Medicine  Author Type: Physician       Filed: 12/23/20 1636  Date of Service: 12/23/20 1551  Status: Addendum          Editor: Carolin GuernseyHildebrand, Daci Stubbe M, MD (Physician)          Related Notes: Original Note by Carolin GuernseyHildebrand, Tacia Hindley M, MD (Physician) filed at 12/23/20 1634               HPI    The patient is a 75 year old female who presents to the emergency department for re-evaluation of right arm pain.  She had a slip and fall on ice four days ago on 02/11, landed on her right elbow, was [facility] for [facility] pain and had x-rays that  showed evidence of a proximal humerus fracture.  She was placed in a sling and prescribed Norco for pain control, follow up with set with [facility] Gwendol[facility]ire [facility] for Thursday, two days from now.  Since then she notes diffuse bruising that has extended  along the upper arm and also has some radiation of pain throughout the upper arm from the shoulder down towards the elbow.  She also has noted some bruising over the thenar eminence of the right hand.  She is right-hand dominant.  She denies numbness,  weakness, or tingling in the fingers, no wrist or forearm pain, denies any neck or head pain.  She reports she initially had been told to use two Norco every 6 hours for pain but has cut that back to only one Norco at bedtime and today started taking  1000 mg acetaminophen and 600 mg ibuprofen as a combination at the recommendation of primary care coverage.  She had this combination shortly prior to arrival and pain is well controlled currently.        Past Medical History:        Diagnosis  Date         ?  Asthma       ?  HTN (hypertension)       ?  Hx of colonoscopy  2005     ?  Menopause            51             Past Surgical History:         Procedure  Laterality  Date          ?  HX CATARACT REMOVAL  Left       ?  HX ROTATOR CUFF REPAIR    2007           Dr. Willaim BaneAndrezi - [place]          ?  HX TONSILLECTOMY    1952               Family History:         Problem  Relation  Age of Onset          ?  No Known Problems  Son            ?  No Known Problems  Daughter               Social History          Socioeconomic History         ?  Marital status:  MARRIED              Spouse name:  Not on file         ?  Number of children:  Not on file     ?  Years of education:  Not on file     ?  Highest education level:  Not on file       Occupational History        ?  Not on file       Tobacco Use         ?  Smoking status:  Never Smoker     ?  Smokeless tobacco:  Never Used       Substance and Sexual Activity         ?  Alcohol use:  Yes             Comment: Rare social ETOH         ?  Drug use:  No     ?  Sexual activity:  Not on file        Other Topics  Concern        ?  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  Difficulty of Paying Living Expenses: Not on file       Food Insecurity:         ?  Worried About Programme researcher, broadcasting/film/video in the Last Year: Not on file     ?  Ran Out of Food in the Last Year: Not on file       Transportation Needs:         ?  Lack of Transportation (Medical): Not on file     ?  Lack of Transportation (Non-Medical): Not on file       Physical Activity:         ?  Days of Exercise per Week: Not on file     ?  Minutes of Exercise per Session: Not on file       Stress:         ?  Feeling of Stress : Not on file       Social Connections:         ?  Frequency of Communication with Friends and Family: Not on file     ?  Frequency of Social Gatherings with Friends and Family: Not on file     ?  Attends Religious Services: Not on file     ?  Active Member of Clubs or  Organizations: Not on file     ?  Attends Banker Meetings: Not on file     ?  Marital Status: Not on file       Intimate Partner Violence:         ?  Fear of Current or Ex-Partner: Not on file     ?  Emotionally Abused: Not on file     ?  Physically Abused: Not on file     ?  Sexually Abused: Not on file       Housing Stability:         ?  Unable to Pay for Housing in the Last Year: Not on file     ?  Number of Places Lived in the Last Year: Not on file        ?  Unstable Housing in the Last Year: Not on file              ALLERGIES: Cefaclor, Methylprednisolone, and Prednisone      Review of Systems-pertinent positives and negatives per the HPI.        Vitals:           12/23/20 1529  12/23/20 1551         BP:  (!) 157/73       Pulse:  82       Resp:  18       Temp:  98.5 ??F (36.9 ??C)       SpO2:  97%       Weight:    83.9 kg (185 lb)         Height:    5\' 6"  (1.676 m)                Physical Exam   Vitals and nursing note reviewed.   Constitutional:        General: She is not in acute distress.     Appearance: She is well-developed. She is not ill-appearing.    HENT:       Head: Normocephalic and atraumatic.   Eyes :       General: No scleral icterus.  Cardiovascular:       Rate and Rhythm: Normal rate and regular rhythm.      Pulses: Normal pulses.            Radial pulses are 2+ on the right side.   Pulmonary:       Effort: Pulmonary effort is normal. No accessory muscle usage.   Musculoskeletal:        Arms:           Hands:         Cervical back: Normal range of motion and neck supple. No tenderness.      Comments: Pain in circled areas, bruising and swelling  noted by patient.  Forearm, right wrist, hand without tenderness to palpation aside from right thenar eminence where there is small ecchymosis.    Skin:      General: Skin is warm and dry.      Capillary Refill: Capillary refill takes less than 2 seconds.      Coloration: Skin is not jaundiced or pale.   Neurological:       Mental Status:  She is alert.      Sensory: No sensory deficit.      Motor: No weakness.      Comments: Normal and intact sensation, motor function in the radial, ulnar, nerve distributions.    Psychiatric :         Mood and Affect: Mood normal.         Behavior: Behavior normal.              MDM    The patient presents as described above.  At this time pain is well controlled after acetaminophen  and ibuprofen earlier today.  Sling is in place appropriately, distal neurovascular function is intact, she has some new discomfort in the right hand with  bruising and will obtain x-rays, also plan for x-rays of the entire humerus to evaluate for evidence of other fractures.  In general it sounds like her medication plan and follow-up plan for two days from now are appropriate.     ED Course as of 12/23/20 1636       Tue Dec 23, 2020        1626  X-rays of the right hand and right humerus reviewed, radiology report appreciated, known proximal humerus fracture again noted, no other fractures  or abnormalities noted. [JH]        1636  X-ray findings discussed with patient and spouse, pain medication treatment plan clarified in terms of daily dosing of acetaminophen, reassurance given  and plan for follow-up in two days with Orthopedics as scheduled. [JH]              ED Course User Index   [JH] Carolin Guernsey, MD           Procedures   NONE      Patient Vitals for the past 12 hrs:            Temp  Pulse  Resp  BP  SpO2            12/23/20 1529  98.5 ??F (36.9 ??C)  82  18  (!) 157/73  97 %           Lab findings during this visit (only abnormal values will be noted, if no value noted then the result was normal range):   Labs Reviewed - No data to display      Radiology studies during this visit   XR HUMERUS RT      Result Date: 12/23/2020   Transverse fracture present through the humeral neck.  Mild medial and cranial shift of the distal fragment is appreciated, with overriding. Osteopenia.       XR HAND RT MIN 3 V      Result Date:  12/23/2020   No acute bony abnormality of the right hand. Regional skeleton is osteopenic.          Medications given in the ED:   Medications - No data to display      Diagnosis:            ICD-10-CM  ICD-9-CM          1.  Other closed displaced fracture of proximal end of right humerus, initial encounter   S42.291A  812.09          2.  Contusion of right thumb without damage to nail, initial encounter   S60.011A  923.3           Condition at disposition:   Condition stable      Disposition:   Discharged        Discharge prescriptions and/or changes if applicable:     Current Discharge Medication List                  Follow-up:   Prudy Feeler, MD   [address]   [place] [address]   [phone]      In 2 days   As scheduled         Please note that portions of this document were created using the M*Modal Fluency Direct dictation system.  Any inconsistencies or typographical errors may be the result of mis-transcription  that persist in spite of proof-reading and should be addressed with the document creator.

## 2021-01-06 ENCOUNTER — Telehealth

## 2021-01-06 MED ORDER — HYDROCHLOROTHIAZIDE 25 MG TAB
25 mg | ORAL_TABLET | Freq: Every day | ORAL | 3 refills | Status: DC
Start: 2021-01-06 — End: 2021-01-13

## 2021-01-06 NOTE — Telephone Encounter (Signed)
-----   Message from Almyra Free sent at 01/05/2021  9:23 AM EST -----  Regarding: FW: refill neededd    ----- Message -----  From: Annye Asa  Sent: 01/05/2021   8:07 AM EST  To: Bim Ma  Subject: refill neededd                                   I need a refill of hydrochlorothiazide...25 mg tabs  1/day-qty 90 please use [facility] in [place].  thanks [employer]

## 2021-01-06 NOTE — Telephone Encounter (Signed)
Last appt @ PCP Office:  Future Appointments   Date Time Provider Department Center   02/19/2021  8:00 AM Harrington Challenger, MD [facility]       MOST RECENT BLOOD PRESSURES  BP Readings from Last 3 Encounters:   12/23/20 (!) 157/73   08/14/20 129/78   02/04/20 129/73        MOST RECENT LAB DATA  Lab Results   Component Value Date/Time    Creatinine 1.04 02/06/2020 09:16 AM    Potassium 4.3 02/06/2020 09:16 AM    ALT (SGPT) 16 02/06/2020 09:16 AM    Cholesterol, total 174 02/06/2020 09:16 AM    HGB 12.8 02/06/2020 09:16 AM    HCT 38.3 02/06/2020 09:16 AM

## 2021-01-12 ENCOUNTER — Encounter

## 2021-01-12 IMAGING — MG MAMMO SCRN BIL W/CAD TOMO
8 series · 8 of 24 positions shown · non-contrast
Comparison: The present examination has been compared to prior imaging studies.

Images Obtained from [facility]
INDICATION: Screening.
TECHNIQUE: Bilateral 2-D digital screening mammogram was performed followed by 3-D tomosynthesis.  Current study was also evaluated with a computer aided detection (CAD) system.
MAMMOGRAM FINDINGS:
There are scattered areas of fibroglandular density.
No suspicious abnormality is seen in either breast.  There are no significant changes from the prior study.

[L MLO]
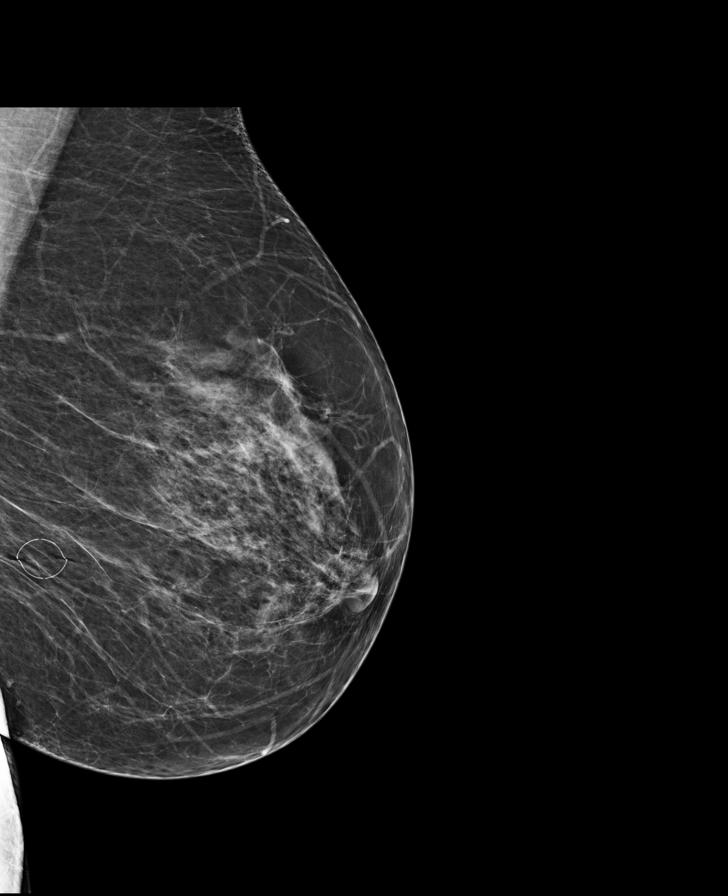

[R MLO]
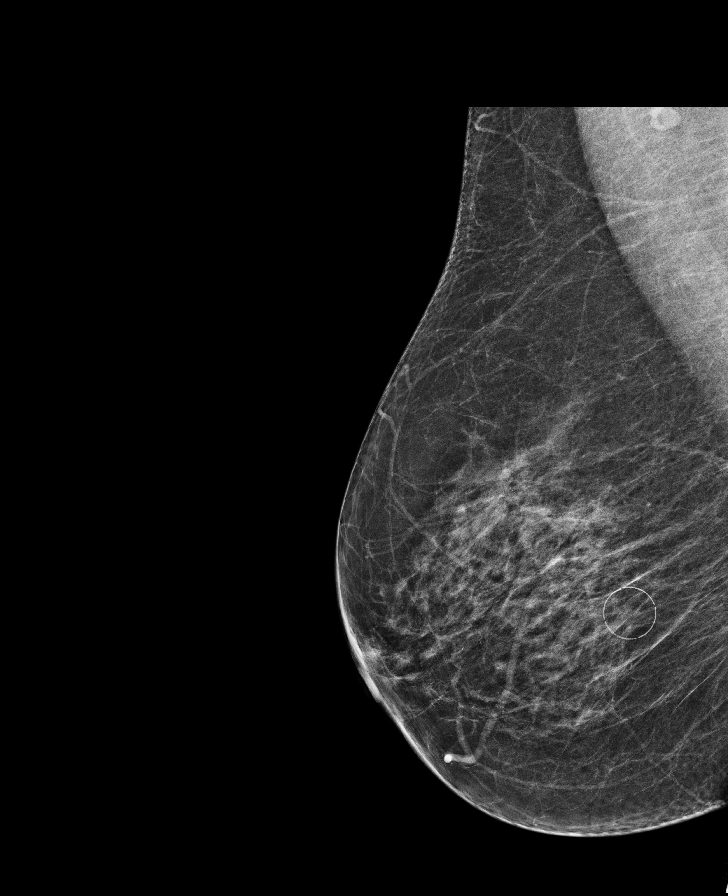

[L CC]
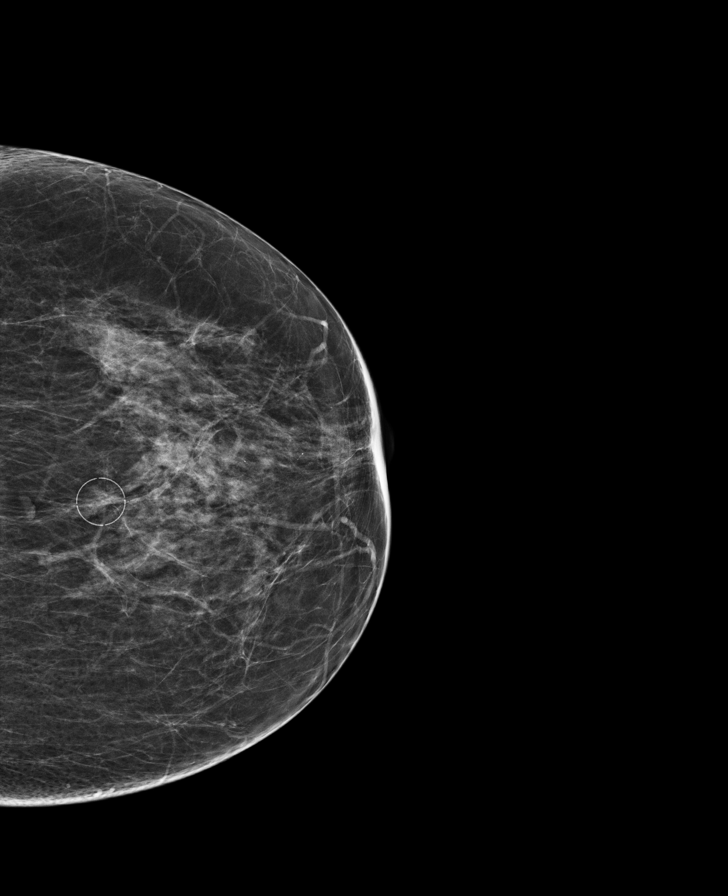

[R CC]
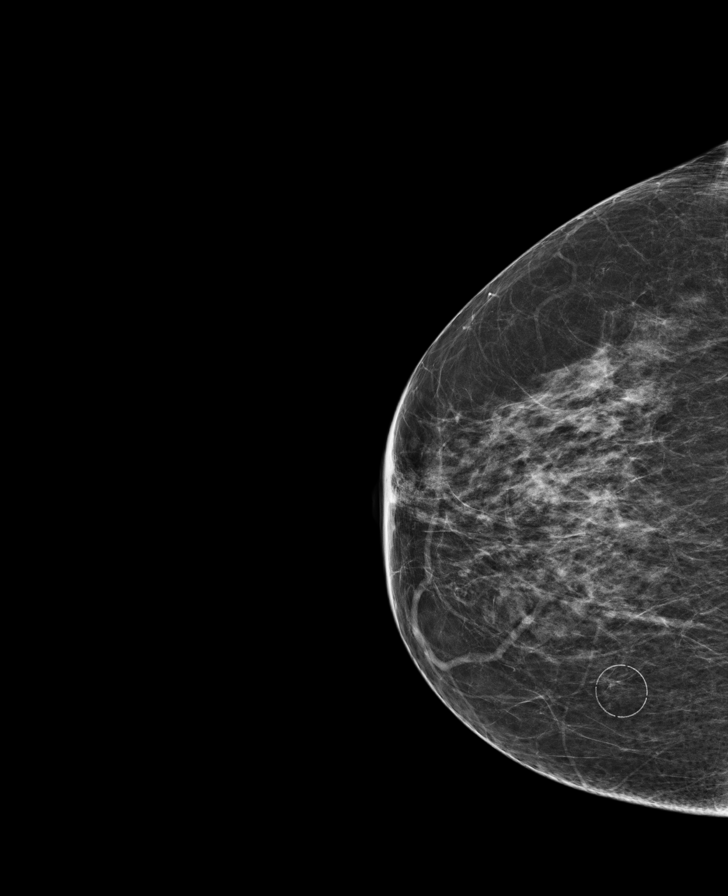

[R MLO tomo · tomo slice 32/63.0]
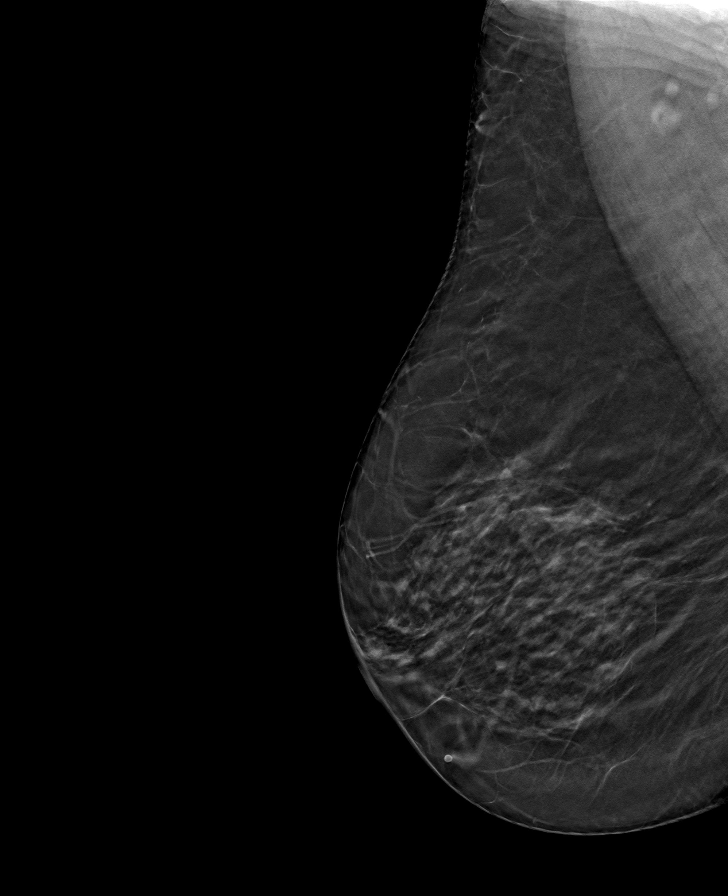

[R CC tomo · tomo slice 29/58.0]
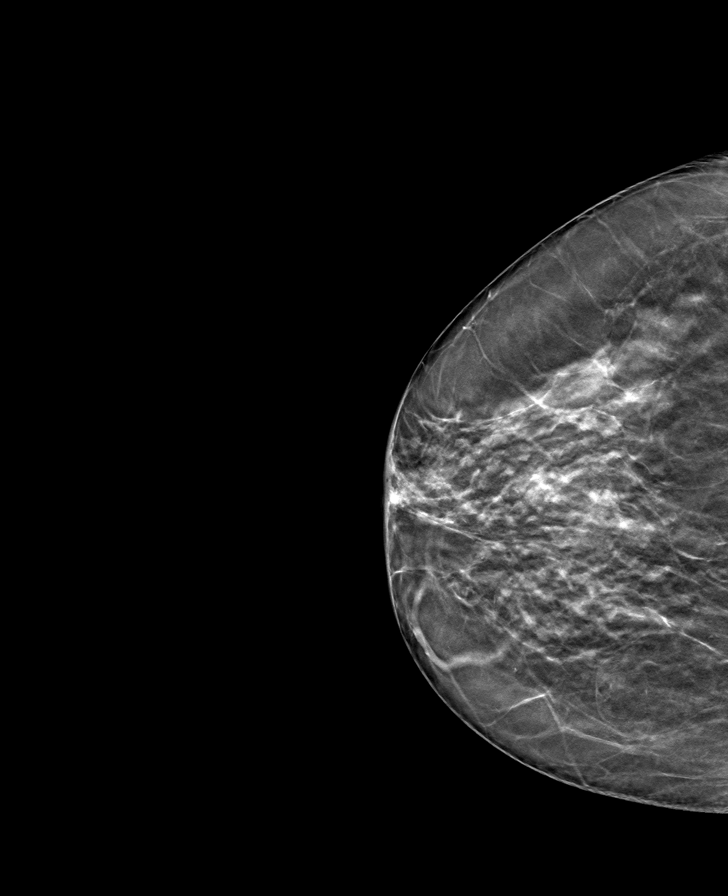

[L CC tomo · tomo slice 29/57.0]
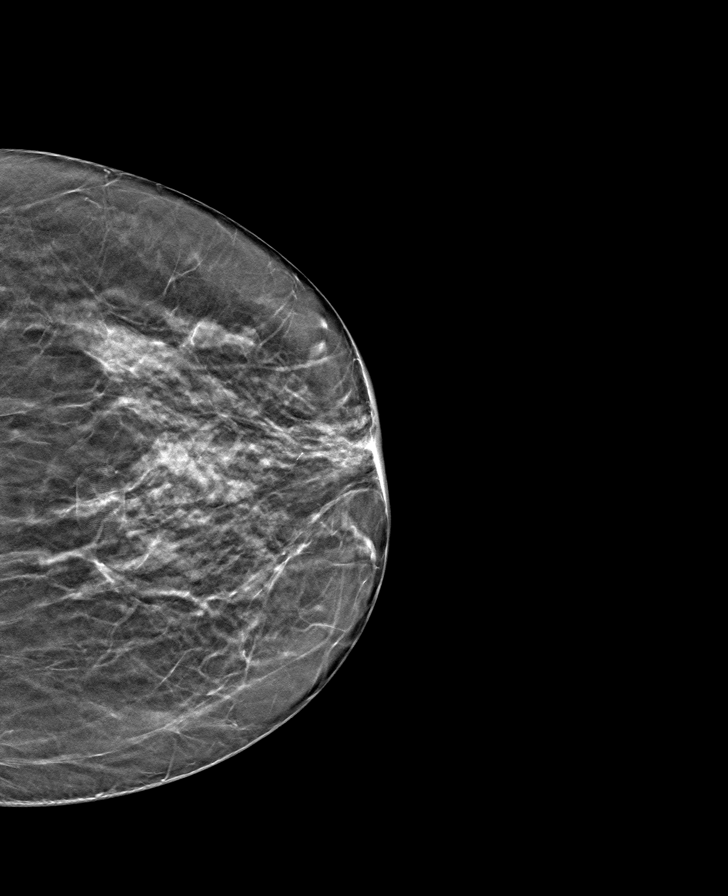

[L MLO tomo · tomo slice 33/64.0]
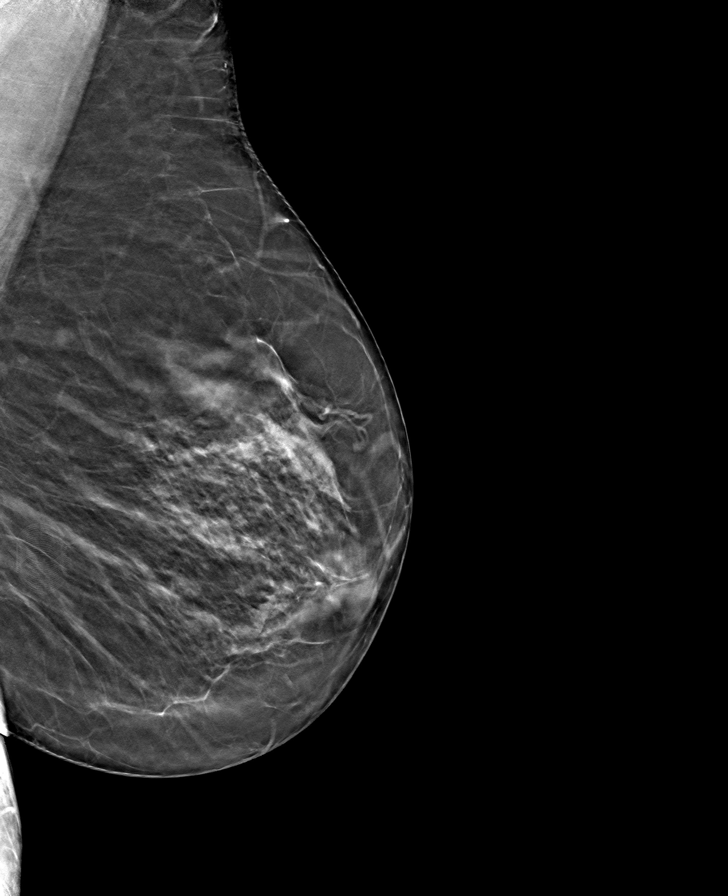

[8 of 24 positions shown; findings below may reference images not displayed]

IMPRESSION: There is no mammographic evidence of malignancy.
Screening mammogram recommended in 1 year.
BI-RADS Category 1: Negative

## 2021-01-13 MED ORDER — HYDROCHLOROTHIAZIDE 25 MG TAB
25 mg | ORAL_TABLET | Freq: Every day | ORAL | 3 refills | Status: AC
Start: 2021-01-13 — End: ?

## 2021-01-30 ENCOUNTER — Telehealth

## 2021-01-30 MED ORDER — LISINOPRIL 40 MG TAB
40 mg | ORAL_TABLET | Freq: Every day | ORAL | 3 refills | Status: AC
Start: 2021-01-30 — End: ?

## 2021-01-30 NOTE — Telephone Encounter (Signed)
 Script sent.  Rhonda [name]  Mar 02, 1946      Call back needed: no    Preferred call back number: Call preference: Home phone   [phone] (home)    Telephone Information:   Mobile [phone]        Medications Requested:  Requested Prescriptions     Signed Prescriptions Disp Refills   . lisinopriL  (PRINIVIL , ZESTRIL ) 40 mg tablet 180 Tablet 3     Sig: Take 2 Tablets by mouth daily.     Authorizing Provider: ARNETHA RIPLEY SQUIBB     Ordering User: RIKER, JOANNE C           Preferred Pharmacy:   [facility] [id number] - [place], [place] - [address]  [address]  [place] [address]  Phone: [phone] Fax: [phone]    [facility] [place], [place] - [address]  [address] [place] [address]  Phone: [phone] Fax: [phone]                                       Last appt @ PCP Office: 08/14/2020    Future Appointments   Date Time Provider Department Center   02/19/2021  8:00 AM ARNETHA RIPLEY SQUIBB, MD [facility]       MOST RECENT BLOOD PRESSURES  BP Readings from Last 3 Encounters:   12/23/20 (!) 157/73   08/14/20 129/78   02/04/20 129/73         MOST RECENT LAB DATA  Lab Results   Component Value Date/Time    Creatinine 1.04 02/06/2020 09:16 AM    Potassium 4.3 02/06/2020 09:16 AM    ALT (SGPT) 16 02/06/2020 09:16 AM    Cholesterol, total 174 02/06/2020 09:16 AM    HGB 12.8 02/06/2020 09:16 AM    HCT 38.3 02/06/2020 09:16 AM

## 2021-01-30 NOTE — Telephone Encounter (Signed)
-----   Message from Murrell Converse, [place] sent at 01/30/2021  9:58 AM EDT -----  Regarding: FW: prescription refill    ----- Message -----  From: Annye Asa  Sent: 01/30/2021   8:54 AM EDT  To: Bim Ma  Subject: prescription refill                              need a refill of lisinopril, 40 mg, tabs, qty 180 to [facility].   thanks Antonietta Jewel

## 2021-02-06 ENCOUNTER — Other Ambulatory Visit (INDEPENDENT_AMBULATORY_CARE_PROVIDER_SITE_OTHER): Payer: Self-pay | Admitting: Physician Assistant

## 2021-02-06 DIAGNOSIS — K589 Irritable bowel syndrome without diarrhea: Secondary | ICD-10-CM

## 2021-02-06 MED ORDER — DIPHENOXYLATE-ATROPINE 2.5-0.025 MG OR TABS
ORAL_TABLET | ORAL | 0 refills | Status: DC
Start: 2021-02-06 — End: 2021-06-12

## 2021-02-18 ENCOUNTER — Ambulatory Visit (INDEPENDENT_AMBULATORY_CARE_PROVIDER_SITE_OTHER): Admitting: Student in an Organized Health Care Education/Training Program

## 2021-02-18 VITALS — BP 140/62 | HR 70 | Temp 96.8°F | Resp 16 | Ht 62.0 in | Wt 157.8 lb

## 2021-02-18 LAB — UA, CHEM ONLY POCT
Bilirubin: NEGATIVE
Blood: NEGATIVE
Glucose: NEGATIVE
Ketones: NEGATIVE
Protein: NEGATIVE
Specific Gravity: 1.025 (ref 1.002–1.030)
Urobilinogen: NEGATIVE (ref 0.2–1.0)
pH: 6 (ref 5–8)

## 2021-02-18 MED ORDER — CIPROFLOXACIN HCL 500 MG OR TABS
500.0000 mg | ORAL_TABLET | Freq: Two times a day (BID) | ORAL | 0 refills | Status: DC
Start: 2021-02-18 — End: 2021-04-16

## 2021-02-18 NOTE — Assessment & Plan Note (Signed)
 Active  POC UA +leukocyte, +nitrite   Mild right CVA tenderness.  No fever, nausea.  Low suspicion for pyelonephritis.   Cipro 500 mg twice per day for 5 days ordered  Additional Cipro 500 mg twice per day for 5 days ordered for self treatment of recurrent UTI  Discussed GYN referral for vaginal estrogen/further workup of recurrent UTI  Patient declines, stating that vaginal estrogen is too expensive.  Urine sent for culture.   Follow up as needed

## 2021-02-18 NOTE — Assessment & Plan Note (Addendum)
 Active  POC UA +leukocyte, +nitrite   Mild right CVA tenderness.  No fever, nausea.  Low suspicion for pyelonephritis.   Cipro 500 mg twice per day for 5 days ordered  Additional Cipro 500 mg twice per day for 5 days ordered for self treatment of recurrent UTI  Discussed GYN referral for vaginal estrogen/further workup of recurrent UTI  Patient declines, stating that vaginal estrogen is too expensive.  Urine sent for culture.   Follow up as needed

## 2021-02-18 NOTE — Progress Notes (Signed)
 [facility]  [place] - [place]-  [place] - [place] - [place]   [email or web address] and [email or web address]  Telephone: [phone]             Encounter Date:  02/18/21  10:34 AM   PCP: Adrienne Mocha  MRN: [id number]  DOB: Oct 03, 1946       HPI:  Kathy Cherry is a 75 year old female presents   Chief Complaint   Patient presents with   . UTI Symptoms     X Saturday pt started with UTI symptoms, PT is currently having bladder pressure, lower back pain, urge to urine, frequency in urinating        1. UTI symptoms   Lower abdominal pressure, urinary frequency, low back pain    Denies fever, nausea, history of pyelonephritis     Reports frequent UTI- last one in December, 21  Treated with Cipro 500 mg twice per day for 5 days         PROBLEM  LIST:  Patient Active Problem List   Diagnosis   . GERD (gastroesophageal reflux disease)   . IBS (irritable bowel syndrome)   . Chronic knee pain after total replacement of right knee joint   . Left shoulder pain   . Allergic rhinitis   . History of bilateral breast cancer   . Hyperlipidemia   . Blurred vision, bilateral   . Acute cystitis with hematuria   . Dysuria   . History of recurrent UTIs   . Family history of colon cancer requiring screening colonoscopy   . Atrophic vaginitis   . History of peptic ulcer   . Urinary tract infection without hematuria, site unspecified   . Upper respiratory tract infection, unspecified type   . High cholesterol   . Insomnia, unspecified type   . Dry eye   . Need for hepatitis C screening test   . Annual physical exam   . Breast cancer screening   . Postmenopausal   . Colon cancer screening   . Need for shingles vaccine   . Atherosclerosis of aorta (CMS-HCC)   . Presence of intraocular lens   . Puckering of macula, bilateral   . Presbyopia   . Macular puckering of retina   . Vitreous degeneration   . Tear film insufficiency   . Senile nuclear sclerosis   . Recurrent UTI   . Rash   . UTI symptoms   . Bilateral  foot pain   . Bilateral hand pain   . Left knee pain, unspecified chronicity   . Tingling of both feet   . Symptoms consistent with irritable bowel syndrome   . Cough   . Postnasal drip   . Closed fracture of distal end of right humerus with routine healing   . Acute colitis   . Closed fracture of proximal end of right humerus, unspecified fracture morphology, sequela   . Preoperative examination   . Elevated LFTs   . Right shoulder pain, unspecified chronicity   . Need for vaccination   . Abdominal pain, unspecified abdominal location   . Sinusitis, unspecified chronicity, unspecified location         PAST MEDICAL HISTORY:  Past Medical History:   Diagnosis Date   . Colitis     x 2 09/24/2017 and 10/28/2017       PAST SURGICAL HISTORY:  Past Surgical History:   Procedure Laterality Date   . SHOULDER OPEN ROTATOR CUFF REPAIR Left 2015   . TOTAL  KNEE ARTHROPLASTY  2013   . APPENDECTOMY      75 y/o   . Bleeding Ulcers      75 y/o   . BREAST LUMPECTOMY Left     x 2    . CATARACT EXTRACTION     . LYMPHADENECTOMY Left         FAMILY HISTORY:   Family History   Problem Relation Name Age of Onset   . Breast Cancer Mother     . Diabetes Mother     . Hypertension Mother     . Obesity Mother     . Colon Cancer Father     . Diabetes Father     . Hypertension Father     . Obesity Father     . Breast Cancer Maternal Grandmother     . Breast Cancer Paternal Grandmother           SOCIAL HISTORY:  Social History     Socioeconomic History   . Marital status: Married     Spouse name: Not on file   . Number of children: Not on file   . Years of education: Not on file   . Highest education level: Not on file   Occupational History   . Not on file   Tobacco Use   . Smoking status: Former Smoker     Quit date: 2006     Years since quitting: 16.2   . Smokeless tobacco: Never Used   Substance and Sexual Activity   . Alcohol use: Yes     Alcohol/week: 8.3 standard drinks     Types: 1 Glasses of wine per week     Comment: rare   . Drug use:  No   . Sexual activity: Not on file   Other Topics Concern   . Not on file   Social History Narrative   . Not on file     Social Determinants of Health     Financial Resource Strain: Not on file   Food Insecurity: Not on file   Transportation Needs: Not on file   Physical Activity: Not on file   Stress: Not on file   Social Connections: Not on file   Intimate Partner Violence: Not on file   Housing Stability: Not on file     Social History     Tobacco Use   Smoking Status Former Smoker   . Quit date: 2006   . Years since quitting: 16.2   Smokeless Tobacco Never Used      Social History     Substance and Sexual Activity   Alcohol Use Yes   . Alcohol/week: 8.3 standard drinks   . Types: 1 Glasses of wine per week    Comment: rare      Social History     Substance and Sexual Activity   Drug Use No        Immunization History   Administered Date(s) Administered   . COVID-19 [employer]ala [place]doch) 11/22/2019, 12/26/2019   . COVID-19 [employer]) Purple Cap 09/24/2020   . Influenza Vaccine (High Dose) >=65 Years 09/02/2018, 09/02/2018   . Influenza Vaccine >=6 Months 09/01/2020   . Tdap 05/01/2019         CURRENT  MEDICATIONS:  Current Outpatient Medications on File Prior to Visit   Medication Sig Dispense Refill   . [DISCONTINUED] ciprofloxacin (CIPRO) 500 MG tablet Take 1 tablet (500 mg) by mouth 2 times daily. 10 tablet 0   . CRANBERRY PO      .  diphenoxylate-atropine (LOMOTIL) 2.5-0.025 MG tablet TAKE 1 TABLET BY MOUTH 4 TIMES A DAY AS NEEDED FOR DIARRHEA 90 tablet 0   . [DISCONTINUED] HYDROcodone-acetaminophen (NORCO) 10-325 MG tablet   See Instructions, 0.5 - 1 Tabs Oral q4-6H PRN PAIN, 0 Refill(s), Maintenance, 69     . [DISCONTINUED] ketOROLAC (TORADOL) 10 MG tablet 10 mg.     . [DISCONTINUED] ondansetron (ZOFRAN ODT) 4 MG disintegrating tablet Take 1 tablet (4 mg) on or under the tongue every 8 hours as needed for Nausea/Vomiting. 30 tablet 0   . simvastatin (ZOCOR) 10 MG tablet Take 1 tablet (10 mg) by mouth every evening. 90  tablet 3   . [DISCONTINUED] traMADol (ULTRAM) 50 MG tablet Take 1 tablet (50 mg) by mouth every 8 hours as needed for Moderate Pain (Pain Score 4-6). 30 tablet 0   . traZODone (DESYREL) 50 MG tablet TAKE 1 TABLET BY MOUTH NIGHTLY 90 tablet 2     No current facility-administered medications on file prior to visit.     Outpatient Medications Marked as Taking for the 02/18/21 encounter (Office Visit) with Blake Divine   Medication Sig Dispense Refill   . CRANBERRY PO      . diphenoxylate-atropine (LOMOTIL) 2.5-0.025 MG tablet TAKE 1 TABLET BY MOUTH 4 TIMES A DAY AS NEEDED FOR DIARRHEA 90 tablet 0   . simvastatin (ZOCOR) 10 MG tablet Take 1 tablet (10 mg) by mouth every evening. 90 tablet 3   . traZODone (DESYREL) 50 MG tablet TAKE 1 TABLET BY MOUTH NIGHTLY 90 tablet 2        ALLERGIES:    Allergies   Allergen Reactions   . Valium [Diazepam] Other     Per pt gets hyper sensitive          REVIEW OF SYSTEMS:  Review of Systems  Negative other than stated in HPI         PHYSICAL EXAM:   02/18/21  1021   BP: 140/62   Pulse: 70   Resp: 16   Temp: 96.8 F (36 C)     Body mass index is 28.86 kg/m.    Ht Readings from Last 1 Encounters:   02/18/21 5\' 2"  (1.575 m)     Wt Readings from Last 1 Encounters:   02/18/21 71.6 kg (157 lb 12.8 oz)           Physical Exam  Constitutional:       Appearance: Normal appearance. She is normal weight.   HENT:      Head: Normocephalic and atraumatic.   Cardiovascular:      Rate and Rhythm: Normal rate and regular rhythm.      Heart sounds: Normal heart sounds. No murmur heard.    No friction rub. No gallop.   Pulmonary:      Effort: Pulmonary effort is normal.      Breath sounds: Normal breath sounds. No wheezing, rhonchi or rales.   Abdominal:      General: Abdomen is flat. Bowel sounds are normal.      Palpations: Abdomen is soft.      Tenderness: There is no abdominal tenderness. There is right CVA tenderness. There is no left CVA tenderness, guarding or rebound.   Musculoskeletal:          General: Normal range of motion.      Cervical back: Normal range of motion.   Skin:     General: Skin is warm and dry.   Neurological:  General: No focal deficit present.      Mental Status: She is alert. Mental status is at baseline.   Psychiatric:         Mood and Affect: Mood normal.         Behavior: Behavior normal.         Thought Content: Thought content normal.         Judgment: Judgment normal.                  ASSESSMENT & PLAN:    Vitals are reviewed.   Prior labs and imaging are reviewed.    Kathy Cherry was seen today for uti symptoms.    Diagnoses and all orders for this visit:    Urinary tract infection without hematuria, site unspecified  Assessment & Plan:  Active  POC UA +leukocyte, +nitrite   Mild right CVA tenderness.  No fever, nausea.  Low suspicion for pyelonephritis.   Cipro 500 mg twice per day for 5 days ordered  Additional Cipro 500 mg twice per day for 5 days ordered for self treatment of recurrent UTI  Discussed GYN referral for vaginal estrogen/further workup of recurrent UTI  Patient declines, stating that vaginal estrogen is too expensive.  Urine sent for culture.   Follow up as needed       Orders:  -     ciprofloxacin (CIPRO) 500 MG tablet; Take 1 tablet (500 mg) by mouth 2 times daily. For 5 days.   Additional 10 tablets ordered for self treatment of recurrent UTI.    UTI symptoms  Assessment & Plan:  Active  POC UA +leukocyte, +nitrite   Mild right CVA tenderness.  No fever, nausea.  Low suspicion for pyelonephritis.   Cipro 500 mg twice per day for 5 days ordered  Additional Cipro 500 mg twice per day for 5 days ordered for self treatment of recurrent UTI  Discussed GYN referral for vaginal estrogen/further workup of recurrent UTI  Patient declines, stating that vaginal estrogen is too expensive.  Urine sent for culture.   Follow up as needed     Orders:  -     UA, Chem Only (POCT)  -     Urine Culture - See Instructions        ICD-10-CM ICD-9-CM    1. Urinary tract  infection without hematuria, site unspecified  N39.0 599.0 ciprofloxacin (CIPRO) 500 MG tablet   2. UTI symptoms  R39.9 788.99 UA, Chem Only (POCT)      Urine Culture - See Instructions      CANCELED: UA, Chem Only (POCT)       The patient is receptive to the plan and understands all instructions.    Follow up is discussed.    All questions are answered.     Blake Divine, PA-C    [facility]  [email or web address]

## 2021-02-19 ENCOUNTER — Ambulatory Visit: Attending: Internal Medicine | Primary: Internal Medicine

## 2021-02-19 ENCOUNTER — Encounter

## 2021-02-19 ENCOUNTER — Ambulatory Visit: Admit: 2021-02-19 | Discharge: 2021-02-19 | Payer: MEDICARE | Attending: Internal Medicine | Primary: Internal Medicine

## 2021-02-19 DIAGNOSIS — Z0000 Encounter for general adult medical examination without abnormal findings: Secondary | ICD-10-CM

## 2021-02-19 NOTE — Progress Notes (Signed)
[facility]   [place] [address]  [phone]    INITIAL MEDICARE [place] VISIT       CHIEF Rhonda [name], 75 y.o. female, presents for Annual Wellness Visit, Elbow Injury, GERD, Sleep Apnea, and Asthma.     HPI   Rhonda [name] is here for [facility] and follow up of HTN, GERD, OSA, Asthma and right humerus fracture.   She sustained a right humerus fracture on back in February with minimal displacement. Currently she is no conservative management.   Bruising has resolved. She has some swelling in the distal arm but overall she is doing well. Pain is well controlled with opiates.   Repeat BP is 140/80 mm Hg   She denies cough, chest discomfort, dizziness.   GERD symptoms are stable on pantoprazole.   She is still awaiting CPAP.  Eye exam uptodate   Colonscopy uptodate.  Mammogram uptodate.      OBJECTIVE     Visit Vitals  BP (!) (P) 140/80   Pulse 76   Resp 16   Ht '5\' 6"'$  (1.676 m)   Wt 183 lb (83 kg)   BMI 29.54 kg/m??      BP Readings from Last 3 Encounters:   02/19/21 (!) (P) 140/80   12/23/20 (!) 157/73   08/14/20 129/78      Wt Readings from Last 3 Encounters:   02/19/21 183 lb (83 kg)   12/23/20 185 lb (83.9 kg)   08/14/20 190 lb (86.2 kg)     Physical Exam  Vitals and nursing note reviewed.   Constitutional:       Appearance: Normal appearance.   Eyes:      Conjunctiva/sclera: Conjunctivae normal.   Neck:      Vascular: No carotid bruit.   Cardiovascular:      Rate and Rhythm: Normal rate and regular rhythm.      Heart sounds: Normal heart sounds. No murmur heard.  No friction rub. No gallop.    Pulmonary:      Breath sounds: Normal breath sounds. No wheezing or rhonchi.   Abdominal:      General: Bowel sounds are normal.      Palpations: Abdomen is soft.   Musculoskeletal:      Cervical back: Neck supple.      Right lower leg: No edema.      Left lower leg: No edema.   Neurological:      General: No focal deficit present.      Mental Status: She is alert and  oriented to person, place, and time. Mental status is at baseline.   Psychiatric:         Mood and Affect: Mood normal.         Behavior: Behavior normal.         Review of Systems   Constitutional: Negative for chills, fever and malaise/fatigue.   HENT: Negative for congestion and sore throat.    Eyes: Negative for blurred vision and double vision.   Respiratory: Negative for cough, shortness of breath and wheezing.    Cardiovascular: Negative for chest pain, palpitations and leg swelling.   Gastrointestinal: Negative for abdominal pain, nausea and vomiting.   Genitourinary: Negative for urgency.   Musculoskeletal: Positive for joint pain.   Neurological: Negative for dizziness, loss of consciousness and headaches.   Psychiatric/Behavioral: The patient is not nervous/anxious.      ASSESSMENT AND PLAN  Diagnoses and all orders for this visit:    1. Medicare annual wellness visit, subsequent  Vaccination uptodate  Mammogram uptodate on 09/12/2020.  Last colonoscopy was normal on 08/23/2016,next in 10 years    2. Moderate persistent asthma without complication  Breathing is stable.     3. Hypertension, benign  Repeat BP is higher side of normal. She will continue to monitor BP at home.    4. Preventative health care  Mammogram normal on 09/12/2020.  Colonoscopy normal on 10/16/20217, next in 10 years  -     CBC WITH AUTOMATED DIFF; Future  -     METABOLIC PANEL, COMPREHENSIVE; Future  -     LIPID PANEL W/ REFLX DIRECT LDL; Future    5. Obstructive sleep apnea syndrome  She is awaiting CPAP replacement    6. GERD without esophagitis  Stable on pantoprazole.    7. Closed nondisplaced fracture of surgical neck of right humerus, unspecified fracture morphology, sequela  Pain is well controlled. Continue follow up with Dr. Jack Quarto.       WELLNESS EVALUATION & HEALTH RISK ASSESSMENT     DEPRESSION SCREENING  3 most recent [place] Screens 02/19/2021   Little interest or pleasure in doing things Not at all   Feeling down,  depressed, irritable, or hopeless Not at all   Total Score PHQ 2 0     C-SSRS [place] Suicide Severity Rating Scale 12/23/2020   1) Within the past month, have you wished you were dead or wished you could go to sleep and not wake up?  No   2) Have you actually had any thoughts of killing yourself? No   6) Have you ever done anything, started to do anything, or prepared to do anything to end your life? No       FALL RISK SCREENING  Fall Risk Assessment, last 12 mths 02/19/2021   Able to walk? Yes   Fall in past 12 months? 1   Do you feel unsteady? 0   Are you worried about falling 1   Is TUG test greater than 12 seconds? 0   Is the gait abnormal? 0   Number of falls in past 12 months 1   Fall with injury? 1       GENERAL  In general, how would you say your health is?: Good  Do you have a Living Will?: Yes    [name]  Do you worry whether your food will run out before you have money to buy more?: No  Have you lost any weight without trying in the past 3 months?: No    HEARING/VISION/SKIN  Do you or your family notice any trouble with your hearing?: No  Do you have difficulty driving, watching TV, or doing any of your daily activities because of your eyesight?: No  Do you have any skin concerns?: No    SLEEP/MEMORY/ANXIETY  Do you have concerns about your sleep?: No  Do you often feel tired, fatigued, or sleepy during the daytime, even after a "good" night's sleep? : (!) Yes  Do you or a family member have concerns about your memory?: No  Over the last several months, have you been continually worried or anxious about a number of events or activities in your daily life? : (!) Yes    SUBSTANCE AND OPIOID USE  Do you currently drink alcohol?: No  In the past year, have you used street drugs or prescription medications not prescribed to you?:  No  Are you currently using a prescription opioid pain medication such as oxycodone, tramadol, hydrocodone or morphine?: No    SAFETY  Does your home have throw  rugs, poor lighting, a slippery bathtub or shower, or clutter in the hallways?: No  Do all of your stairways have a railing or banister?: Yes  Do you have difficulty with balance or walking?: (!) Yes    ADLS  In the past 7 days, did you need help from others to perform any of the following everyday activities: eating dressing, grooming, bathing, toileting, walking/balance?: No  In the past 7 days, did you need help from others to take care of laundry, housekeeping, banking or paying bills, shopping, telephone use, food preparation, transportation or taking medications?: No    PHYSICAL ACTIVITY  Are You Physically Active: Yes  Do You Have Difficulty Exercising: No  What Type of Activity Do You Engage In: Aerobics or other cardio  How Many Days Are You Active Per Week : 3-5  How Many Average Minutes of Activity Per Day: 45-60 Minutes     TIMED UP AND GO TEST (If indicated)  Timed Up and Go TUG Observations: (!) Slow tentative pace    MINI-COG SCORE (If indicated)       STOP-BANG SCORE (If indicated)            [place] Maintenance Topics with due status: Overdue       Topic Date Due    COVID-19 Vaccine 05/26/2020     Health Maintenance Topics with due status: Not Due       Topic Last Completion Date    DTaP/Tdap/Td series 10/03/2015    Colorectal Cancer Screening Combo 08/23/2016    Flu Vaccine 08/12/2018    Lipid Screen 02/06/2020    Breast Cancer Screen Mammogram 09/12/2020    Medicare Yearly Exam 02/19/2021    Depression Screen 02/19/2021     Health Maintenance Topics with due status: Completed       Topic Last Completion Date    Bone Densitometry (Dexa) Screening 12/23/2014    Pneumococcal 65+ years 10/03/2015    Hepatitis C Screening 10/03/2017    Shingrix Vaccine Age 63> 11/09/2018         Colon cancer:  Recommendation: Colonoscopy every 10y or annual FIT test from 50-75 or every 3 year stool DNA based test with consideration of ongoing screening from 76-85. and Up to date or  Completed    Lung cancer (LDCT): Recommendation: Yearly LDCT for pts 55-77 w 30-pack year hx and currently smoke or quit <15 yr ago. and Not Indicated    Hepatitis C:  Recommendation: One time screening for all patient's aged 18-79. and Up to date or Completed    Diabetes:   Recommendation: USPSTF recommends screening ages 75-70 y/o if overweight or obese. Medicare covers screening in those patients who are overweight, obese, have HTN or dyslipiemia, a personal history of gestational diabetes or prior elevated blood sugar, a family hx of DM, or any patient over age 44 and Due, ordered    Lipids:   Recommendation: screening for hyperlipidemia every 5 years after age 63 and Due, ordered      PREVENTIVE CARE - FEMALE SCREENINGS     Cervical cancer: Recommendation: Every 3 yr from 21-29 and every 5 yr from 12-65, with Pap and HPV testing. and Not Indicated    Breast cancer: Recommendation:  USPSTF recommends screening mammography every 2 yr from  50-74. The decision to start screening mammography in women prior to age 16 years should be an individual one. Women who place a higher value on the potential benefit than the potential harms may choose to begin biennial screening between the ages of 53 and 29 years. Medicare covers annual screening mammography, USPSTF also recommends women with a personal or family history of breast, ovarian, tubal, or peritoneal cancer or who have an ancestry (Ashkenazi Jewish) associated with breast cancer susceptibility 1 and 2 (BRCA1/2) gene mutations with an appropriate brief familial risk assessment tool. Women with a positive result on the risk assessment tool should receive genetic counseling and, if indicated after counseling, genetic testing and mammogram is up to date or completed    Osteoporosis: Recommendation: Screen all women at 73, earlier if elevated risk and Not Indicated    AAA:   Recommendation: One-time screening if family history of AAA and Not Indicated    IMMUNIZATIONS      Immunization History   Administered Date(s) Administered   ??? COVID-19, Moderna, Primary or Immunocompromised Series, MRNA, PF, 135mcg/0.5mL 11/30/2019, 12/28/2019   ??? Influenza High Dose Vaccine PF 08/12/2016, 08/13/2016, 08/12/2018   ??? Influenza Vaccine 08/09/2015, 08/17/2017   ??? Influenza Vaccine (Tri) Adjuvanted (>65 Yrs FLUAD TRI 69678) 08/17/2017   ??? Pneumococcal Conjugate (PCV-13) 04/23/2014   ??? Pneumococcal Polysaccharide (PPSV-23) 10/03/2015   ??? Tdap 10/03/2015   ??? Zoster Recombinant 07/17/2018, 11/09/2018       Pneumovax:   Recommendation: PPSV23 once for all >65 and high risk <65  and Up to date or Completed    Prevnar:   Recommendation: PCV13 only if >65 and immunocompromised or residing in a nursing home, or in areas of low childhood Pneumococcal vaccination and Up to date or Completed    Influenza:   Recommendation: Vaccination annually, high dose if 65 or older and Up to date or Completed    Shingrix:  Recommendation: Vaccination 2 shots 2-6 months apart for all age >53 and Up to date or Completed    TDaP:    Recommendation: Investment banker, operational with TDaP every 10 yr. and Up to date or Completed    INDIVIDUALIZED SCREENING, EDUCATION, AND PLAN     The patient and any caregiver(s) were counseled on: Healthcare maintenance and preventive items as above.    The patient is not on high risk medication(s) including benzodiaepines.    Based on my evaluation of the patient and the Health Risk Assessment performed today, there is not evidence of cognitive impairment.      Medications, allergies, problem list, previous encounters, recent results, medical, social and family history reviewed in the electronic health record. Medications and problems are also viewable in the Encounter tab for this visit.    Current Outpatient Medications   Medication Sig   ??? HYDROcodone-acetaminophen (NORCO) 5-325 mg per tablet Take 1 Tablet by mouth two (2) times a day.   ??? lisinopril-hydroCHLOROthiazide (PRINZIDE, ZESTORETIC)  20-25 mg per tablet Take 1 Tablet by mouth Every morning.   ??? ibuprofen 200 mg cap Take 2 Capsules by mouth every four (4) hours as needed.   ??? lisinopriL (PRINIVIL, ZESTRIL) 40 mg tablet Take 2 Tablets by mouth daily.   ??? hydroCHLOROthiazide (HYDRODIURIL) 25 mg tablet TAKE 1 TABLET BY MOUTH DAILY   ??? fluticasone propion-salmeteroL (Wixela Inhub) 250-50 mcg/dose diskus inhaler Take 1 Puff by inhalation two (2) times a day.   ??? pantoprazole (PROTONIX) 40 mg tablet Take 1 Tablet by mouth daily.   ???  meloxicam (MOBIC) 15 mg tablet Take 0.5 Tablets by mouth two (2) times a day.   ??? albuterol (Proventil HFA) 90 mcg/actuation inhaler Take 2 Puffs by inhalation every six (6) hours as needed for Wheezing. Inhale 2 puffs four times a day if needed   ??? cyanocobalamin (VITAMIN B-12) 1,000 mcg tablet Take 1,000 mcg by mouth daily.   ??? albuterol-ipratropium (DUO-NEB) 2.5 mg-0.5 mg/3 ml nebu 3 mL by Nebulization route every six (6) hours as needed. Use every 4 hours as needed for dyspena   ??? cpap machine kit [employer]et CPAP 6-10cmH2O.     No current facility-administered medications for this visit.     Medications Discontinued During This Encounter   Medication Reason   ??? ascorbic acid, vitamin C, (VITAMIN C) 250 mg tablet Therapy Completed     Allergies   Allergen Reactions   ??? Cefaclor Not Reported This Time   ??? Methylprednisolone Other (comments)     Pt states that she has a reaction    ??? Prednisone Not Reported This Time     Past Medical History:   Diagnosis Date   ??? Asthma    ??? HTN (hypertension)    ??? Hx of colonoscopy 2005   ??? Menopause     51     Past Surgical History:   Procedure Laterality Date   ??? HX CATARACT REMOVAL Left    ??? HX ROTATOR CUFF REPAIR  2007    Dr. Mickie Bail - [place]   ??? HX TONSILLECTOMY  1952     Family History   Problem Relation Age of Onset   ??? No Known Problems Son    ??? No Known Problems Daughter      Social History     Socioeconomic History   ??? Marital status: MARRIED   Tobacco Use   ??? Smoking status:  Never Smoker   ??? Smokeless tobacco: Never Used   Substance and Sexual Activity   ??? Alcohol use: Yes     Comment: Rare social ETOH   ??? 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 Return in about 6 months (around 08/21/2021) for OV40.       Future Appointments   Date Time Provider [place]   08/26/2021  8:20 AM Jerilee Field, MD [facility]       Jerilee Field, MD, 02/19/2021  This encounter has been electronically signed

## 2021-02-20 LAB — URINE CULTURE

## 2021-02-23 LAB — COMPREHENSIVE METABOLIC PANEL
ALT: 17 IU/L (ref 7–52)
AST: 19 IU/L (ref 13–39)
Albumin: 3.9 g/dL (ref 3.5–5.7)
Alkaline Phosphatase: 70 IU/L (ref 34–104)
Anion Gap: 6 mEq/L (ref 3–11)
BUN: 21 mg/dL (ref 7–25)
CO2: 31 mEq/L (ref 21–31)
Calcium: 9.1 mg/dL (ref 8.6–10.3)
Chloride: 105 mEq/L (ref 98–107)
Creatinine: 1.01 mg/dL (ref 0.60–1.20)
EGFR (CKD-EPI): 58 NA — ABNORMAL LOW (ref >=60)
Glucose: 91 mg/dL (ref 70–120)
Potassium: 4.3 mEq/L (ref 3.5–5.1)
Sodium: 142 mEq/L (ref 136–145)
Total Bilirubin: 0.4 mg/dL (ref 0.2–1.0)
Total Protein: 6.4 g/dL (ref 6.0–8.0)

## 2021-02-23 LAB — CBC WITH AUTO DIFFERENTIAL
Hematocrit: 37.8 % (ref 36.0–47.0)
Hemoglobin: 12.5 g/dL (ref 12.0–16.0)
MCH: 30.9 pg (ref 28.0–34.0)
MCHC: 33.1 g/dL (ref 32.0–36.0)
MCV: 93.3 fL (ref 80.0–100.0)
MPV: 9.1 fL (ref 8.5–12.0)
Platelets: 347 10*3/uL (ref 150–400)
RBC: 4.05 Mil/uL — ABNORMAL LOW (ref 4.20–5.40)
RDW-CV,2213: 12.6 % (ref 11.5–13.5)
RDW-SD: 43.2 fL (ref 35.0–47.0)
WBC: 7.9 10*3/uL (ref 4.8–10.8)

## 2021-02-23 LAB — DIFFERENTIAL, AUTO.
ABS. BASOPHILS: 0.07 10*3/uL (ref 0.00–0.20)
ABS. IMM. GRANS.: 0.03 10*3/uL (ref 0.00–0.06)
ABS. MONOCYTES: 0.49 10*3/uL (ref 0.10–0.80)
ABS. NEUTROPHILS: 5.1 10*3/uL (ref 1.90–7.80)
Abs Lymphocytes: 2.1 10*3/uL (ref 1.00–4.50)
BASOPHILS: 0.9 % (ref 0.0–2.0)
BRCH EOSINS: 1.6 % (ref 0.0–5.0)
BRCH EOSINS: 1.6 % (ref 0.0–5.0)
BRCH NEUTROPHIL: 64.4 % (ref 45.0–85.0)
Basophils %: 0.9 % (ref 0.0–2.0)
Basophils Absolute: 0.07 10*3/uL (ref 0.00–0.20)
Eos abs-DIF: 0.13 10*3/uL (ref 0.00–0.50)
Eos abs-DIF: 0.13 10*3/uL (ref 0.00–0.50)
Granulocyte Absolute Count: 0.03 10*3/uL (ref 0.00–0.06)
IMMATURE GRANULOCYTES: 0.4 %
Immature Granulocytes: 0.4 %
LYMPHOCYTES: 26.5 % (ref 25.0–45.0)
Lymphocytes %: 26.5 % (ref 25.0–45.0)
Lymphocytes Absolute: 2.1 10*3/uL (ref 1.00–4.50)
MONOCYTES %, TEST14: 6.2 % (ref 1.0–9.0)
MONOCYTES: 6.2 % (ref 1.0–9.0)
Monocytes Absolute: 0.49 10*3/uL (ref 0.10–0.80)
Neutrophil Count, Fluid: 64.4 % (ref 45.0–85.0)
Neutrophils Absolute: 5.1 10*3/uL (ref 1.90–7.80)

## 2021-02-23 LAB — LIPID PANEL W/ REFLEX DIRECT LDL
Cholesterol, Total: 174 mg/dL (ref 165–199)
HDL: 37 mg/dL — ABNORMAL LOW (ref 40–91)
LDL Calculated: 109 mg/dL (ref 70–129)
NON HDL CHOL. (LDL+VLDL), 804568: 137 mg/dL (ref <=160)
Triglycerides: 141 mg/dL (ref 50–149)

## 2021-02-23 LAB — LIPID PANEL W/ REFLX DIRECT LDL
Cholesterol, total: 174 mg/dL (ref 165–199)
HDL Cholesterol: 37 mg/dL — ABNORMAL LOW (ref 40–91)
LDL CHOL, CALCULATED: 109 mg/dL (ref 70–129)
LDL+VLDL: 137 mg/dL (ref <=160)
Triglyceride: 141 mg/dL (ref 50–149)

## 2021-02-23 LAB — CBC WITH AUTOMATED DIFF
HCT: 37.8 % (ref 36.0–47.0)
HGB: 12.5 g/dL (ref 12.0–16.0)
MCH: 30.9 pg (ref 28.0–34.0)
MCHC: 33.1 g/dL (ref 32.0–36.0)
MCV: 93.3 fL (ref 80.0–100.0)
MEAN PLATELET VOLUME: 9.1 fL (ref 8.5–12.0)
PLATELET: 347 10*3/uL (ref 150–400)
RBC: 4.05 Mil/uL — ABNORMAL LOW (ref 4.20–5.40)
RDW-CV: 12.6 % (ref 11.5–13.5)
RDW-SD: 43.2 fL (ref 35.0–47.0)
WBC: 7.9 10*3/uL (ref 4.8–10.8)

## 2021-02-23 LAB — METABOLIC PANEL, COMPREHENSIVE
ALT (SGPT): 17 IU/L (ref 7–52)
AST (SGOT): 19 IU/L (ref 13–39)
Albumin: 3.9 g/dL (ref 3.5–5.7)
Alk. phosphatase: 70 IU/L (ref 34–104)
Anion gap: 6 mEq/L (ref 3–11)
BUN: 21 mg/dL (ref 7–25)
Bilirubin, total: 0.4 mg/dL (ref 0.2–1.0)
CO2: 31 mEq/L (ref 21–31)
Calcium: 9.1 mg/dL (ref 8.6–10.3)
Chloride: 105 mEq/L (ref 98–107)
Creatinine: 1.01 mg/dL (ref 0.60–1.20)
Glucose: 91 mg/dL (ref 70–120)
Potassium: 4.3 mEq/L (ref 3.5–5.1)
Protein, total: 6.4 g/dL (ref 6.0–8.0)
Sodium: 142 mEq/L (ref 136–145)
eGFR (CKD-EPI): 58 — ABNORMAL LOW (ref >=60)

## 2021-04-02 ENCOUNTER — Telehealth

## 2021-04-02 NOTE — Telephone Encounter (Signed)
Order qued up

## 2021-04-02 NOTE — Telephone Encounter (Signed)
Rhonda [name] is calling from [employer].     Medicare now will pay bone density test 2 years 1 day after last; patient's last bone density 12/2014.     Asking provider to place order for bone density.     [phone]

## 2021-04-02 NOTE — Telephone Encounter (Signed)
Signed.

## 2021-04-02 NOTE — Result Encounter Note (Signed)
 Patient given Cipro- good coverage per urine culture.

## 2021-04-16 ENCOUNTER — Telehealth (INDEPENDENT_AMBULATORY_CARE_PROVIDER_SITE_OTHER): Admitting: Physician Assistant

## 2021-04-16 ENCOUNTER — Encounter (INDEPENDENT_AMBULATORY_CARE_PROVIDER_SITE_OTHER): Payer: Self-pay | Admitting: Physician Assistant

## 2021-04-16 ENCOUNTER — Other Ambulatory Visit: Payer: Self-pay

## 2021-04-16 VITALS — Temp 97.4°F | Ht 62.0 in | Wt 145.0 lb

## 2021-04-16 MED ORDER — AZITHROMYCIN 250 MG OR TABS
250.0000 mg | ORAL_TABLET | ORAL | 0 refills | Status: AC
Start: 2021-04-16 — End: ?

## 2021-04-16 MED ORDER — ALBUTEROL SULFATE 108 (90 BASE) MCG/ACT IN AERS
2.0000 | INHALATION_SPRAY | Freq: Four times a day (QID) | RESPIRATORY_TRACT | 0 refills | Status: AC | PRN
Start: 2021-04-16 — End: ?

## 2021-04-16 NOTE — Progress Notes (Signed)
 Encounter Date:  04/16/21  11:25 AM   PCP: Adrienne Mocha  MRN: [id number]  DOB: Jun 28, 1946    [facility] TELEPHONE SERVICES ENCOUNTER  Pandemic Response Ambulatory Protocol    Evaluator(s):   Annastacia Duba is a 75 year old female  who was evaluated by: Margaretha Seeds, PA-C    Statement:    A Relevant History (including allergies, medications, past medical history, relevant review of systems) and relevant exam as performed by the named provider, are as transcribed in this and/or the accompanying note. Please also see the patient questionnaire for details.    Patient Verification & Telemedicine Consent:      -I have verified that the patient's identification to be correct via verbal confirmation of birth date and address & valid: Yes    -The patient, and / or surrogate, has been made aware that patient is to be evaluated today using a home telemedicine visit technique (audio transmission): Yes    - The patient, and / or surrogate, has been made aware that patient has the right to refuse this type of evaluation at any time during the assessment period, and has been made aware of any alternatives to this type of evaluation: Yes    -The patient, and / or surrogate,  has been made aware that patient may need further evaluations in the future: Yes    -The patient, and / or surrogate, has signed a valid Informed Consent document (detailing risks, benefits, alternatives & costs), or is exempt from these requirements by law, which I verify is currently present in the [facility] MED[facility] RECORD [id number]    Patient is unable to be seen by a specialist in this clinic because:      Pandemic response ambulatory protocol     HPI:  Kathy Cherry is a 75 year old female presents calling [facility] from home for the following:   Chief Complaint   Patient presents with   . Sickness     Started with a head cold a week ago, now coughing a lot, chest hurts and SOB x 10 days     C/o 10 days of cough and congestion   Some sob   Some cp with  deep breathing   Feels short of breath with ambulation         PROBLEM  LIST:  Patient Active Problem List   Diagnosis   . GERD (gastroesophageal reflux disease)   . IBS (irritable bowel syndrome)   . Chronic knee pain after total replacement of right knee joint   . Left shoulder pain   . Allergic rhinitis   . History of bilateral breast cancer   . Hyperlipidemia   . Blurred vision, bilateral   . Acute cystitis with hematuria   . Dysuria   . History of recurrent UTIs   . Family history of colon cancer requiring screening colonoscopy   . Atrophic vaginitis   . History of peptic ulcer   . Urinary tract infection without hematuria, site unspecified   . Upper respiratory tract infection, unspecified type   . High cholesterol   . Insomnia, unspecified type   . Dry eye   . Need for hepatitis C screening test   . Annual physical exam   . Breast cancer screening   . Postmenopausal   . Colon cancer screening   . Need for shingles vaccine   . Atherosclerosis of aorta (CMS-HCC)   . Presence of intraocular lens   . Puckering of  macula, bilateral   . Presbyopia   . Macular puckering of retina   . Vitreous degeneration   . Tear film insufficiency   . Senile nuclear sclerosis   . Recurrent UTI   . Rash   . UTI symptoms   . Bilateral foot pain   . Bilateral hand pain   . Left knee pain, unspecified chronicity   . Tingling of both feet   . Symptoms consistent with irritable bowel syndrome   . Cough   . Postnasal drip   . Closed fracture of distal end of right humerus with routine healing   . Acute colitis   . Closed fracture of proximal end of right humerus, unspecified fracture morphology, sequela   . Preoperative examination   . Elevated LFTs   . Right shoulder pain, unspecified chronicity   . Need for vaccination   . Abdominal pain, unspecified abdominal location   . Sinusitis, unspecified chronicity, unspecified location         PAST MEDICAL HISTORY:  Past Medical History:   Diagnosis Date   . Colitis     x 2 09/24/2017 and  10/28/2017       PAST SURGICAL HISTORY:  Past Surgical History:   Procedure Laterality Date   . SHOULDER OPEN ROTATOR CUFF REPAIR Left 2015   . TOTAL KNEE ARTHROPLASTY  2013   . APPENDECTOMY      75 y/o   . Bleeding Ulcers      75 y/o   . BREAST LUMPECTOMY Left     x 2    . CATARACT EXTRACTION     . LYMPHADENECTOMY Left         FAMILY HISTORY:   Family History   Problem Relation Name Age of Onset   . Breast Cancer Mother     . Diabetes Mother     . Hypertension Mother     . Obesity Mother     . Colon Cancer Father     . Diabetes Father     . Hypertension Father     . Obesity Father     . Breast Cancer Maternal Grandmother     . Breast Cancer Paternal Grandmother           SOCIAL HISTORY:  Social History     Socioeconomic History   . Marital status: Single     Spouse name: Not on file   . Number of children: Not on file   . Years of education: Not on file   . Highest education level: Not on file   Occupational History   . Not on file   Tobacco Use   . Smoking status: Former Smoker     Quit date: 2006     Years since quitting: 16.4   . Smokeless tobacco: Never Used   Substance and Sexual Activity   . Alcohol use: Yes     Alcohol/week: 8.3 standard drinks     Types: 1 Glasses of wine per week     Comment: rare   . Drug use: No   . Sexual activity: Not on file   Other Topics Concern   . Not on file   Social History Narrative   . Not on file     Social Determinants of Health     Financial Resource Strain: Not on file   Food Insecurity: Not on file   Transportation Needs: Not on file   Physical Activity: Not on file   Stress: Not on file  Social Connections: Not on file   Intimate Partner Violence: Not on file   Housing Stability: Not on file     Social History     Tobacco Use   Smoking Status Former Smoker   . Quit date: 2006   . Years since quitting: 16.4   Smokeless Tobacco Never Used      Social History     Substance and Sexual Activity   Alcohol Use Yes   . Alcohol/week: 8.3 standard drinks   . Types: 1 Glasses of  wine per week    Comment: rare      Social History     Substance and Sexual Activity   Drug Use No        Immunization History   Administered Date(s) Administered   . COVID-19 Gala Murdoch) 11/22/2019, 12/26/2019   . COVID-19 [employer]) Purple Cap 09/24/2020   . Influenza Vaccine (High Dose) >=65 Years 09/02/2018, 09/02/2018   . Influenza Vaccine >=6 Months 09/01/2020   . Tdap 05/01/2019         CURRENT  MEDICATIONS:  Current Outpatient Medications on File Prior to Visit   Medication Sig Dispense Refill   . [DISCONTINUED] ciprofloxacin (CIPRO) 500 MG tablet Take 1 tablet (500 mg) by mouth 2 times daily. For 5 days.   Additional 10 tablets ordered for self treatment of recurrent UTI. 20 tablet 0   . CRANBERRY PO      . diphenoxylate-atropine (LOMOTIL) 2.5-0.025 MG tablet TAKE 1 TABLET BY MOUTH 4 TIMES A DAY AS NEEDED FOR DIARRHEA 90 tablet 0   . simvastatin (ZOCOR) 10 MG tablet Take 1 tablet (10 mg) by mouth every evening. 90 tablet 3   . traZODone (DESYREL) 50 MG tablet TAKE 1 TABLET BY MOUTH NIGHTLY 90 tablet 2     No current facility-administered medications on file prior to visit.     Outpatient Medications Marked as Taking for the 04/16/21 encounter [facility]) with Margaretha Seeds, [place]   Medication Sig Dispense Refill   . CRANBERRY PO      . simvastatin (ZOCOR) 10 MG tablet Take 1 tablet (10 mg) by mouth every evening. 90 tablet 3   . traZODone (DESYREL) 50 MG tablet TAKE 1 TABLET BY MOUTH NIGHTLY 90 tablet 2        ALLERGIES:    Allergies   Allergen Reactions   . Valium [Diazepam] Other     Per pt gets hyper sensitive          REVIEW OF SYSTEMS:  Review of Systems   Constitutional: Positive for chills and fever.   HENT: Positive for congestion.    Eyes: Negative.    Respiratory: Positive for cough, chest tightness and shortness of breath.    Cardiovascular: Positive for chest pain. Negative for palpitations and leg swelling.   Gastrointestinal: Negative.    Genitourinary: Negative.    Musculoskeletal:  Negative.    Skin: Negative.    Hematological: Negative.    Psychiatric/Behavioral: Negative.      Negative except as noted in HPI    PHYSICAL EXAM:  General Impression: healthy, alert, no distress, pleasant affect, cooperative, communicative     04/16/21  1106   Temp: 97.4 F (36.3 C)     Body mass index is 26.52 kg/m.    Ht Readings from Last 1 Encounters:   04/16/21 5\' 2"  (1.575 m)     Wt Readings from Last 1 Encounters:   04/16/21 65.8 kg (145 lb)  Physical Exam        ASSESSMENT & PLAN:    Kathy Cherry was seen today for sickness.    Diagnoses and all orders for this visit:    Lower resp. tract infection  -     albuterol 108 (90 Base) MCG/ACT inhaler; Inhale 2 puffs by mouth every 6 hours as needed for Wheezing.  -     azithromycin (ZITHROMAX) 250 MG tablet; Take 1 tablet (250 mg) by mouth As Directed. Take 2 tablets by mouth on day 1, then take 1 tablet daily on days 2-5.        Stable   Covid 19 Infection    Denies severe SOB   Advised that pt be evaluated in the ER, however pt refused    Pt understands that not going to the ER could result  harm or death    Discussed possibly of PE. Pt also refused to get lab work    Advised to start antibiotics    Advised isolation 14 days from known exposure, or 7 days from onset of sx including 3 days wo a fever   Vitamin C 500 mg BID, Zinc 75-100 mg/day, and Vitamin D3 2000 - 4000 u/day    Start Flonase twice a day and Claritin daily    Over the counter Mucinex DM for cough    Increase water intake and rest     Over the counter Tylenol or ibuprofen for fever or body aches   Advised ER for any severe SOB or fever not responding to OTC medication    Follow up with primary care provider or call clinic in 2-4 days for check up          ICD-10-CM ICD-9-CM    1. Lower resp. tract infection  J22 519.8 albuterol 108 (90 Base) MCG/ACT inhaler      azithromycin (ZITHROMAX) 250 MG tablet       Patient instructions:  See EPIC instructions.  Reviewed verbally  and/or AVS available via MYchart for patient.      Barriers to learning assessed: None.    Patient/family verbalizes understanding and is agreeable to above plan.    Patient was evaluated by Margaretha Seeds PA-C at [facility]    TIME SPENT ON MEDICAL DISCUSSION INCLUDING BEFORE, DURING, AND AFTER PHONE CONVERSATION: 15 min    Start & Stop time: [id number]  [phone]      Electronically signed and reviewed by Margaretha Seeds, PA-C    [facility]  [email or web address]

## 2021-04-30 NOTE — Telephone Encounter (Signed)
Carrie from [facility] called to follow up to see if bone density was completed. I advised that per the order scheduling reached out to pt, pt wanted to speak with provider before scheduling I don't see that the pt has reached out concerning this.     Please advise  [phone]

## 2021-05-08 ENCOUNTER — Other Ambulatory Visit (INDEPENDENT_AMBULATORY_CARE_PROVIDER_SITE_OTHER): Payer: Self-pay | Admitting: Physician Assistant

## 2021-05-08 DIAGNOSIS — J22 Unspecified acute lower respiratory infection: Secondary | ICD-10-CM

## 2021-05-08 NOTE — Telephone Encounter (Signed)
 First attempt-LMTCB Please inform patient of information below.

## 2021-05-08 NOTE — Telephone Encounter (Signed)
Pt needs to follow up with pcp for refills

## 2021-05-13 ENCOUNTER — Other Ambulatory Visit (INDEPENDENT_AMBULATORY_CARE_PROVIDER_SITE_OTHER): Payer: Self-pay | Admitting: Physician Assistant

## 2021-05-13 DIAGNOSIS — J22 Unspecified acute lower respiratory infection: Secondary | ICD-10-CM

## 2021-05-14 NOTE — Telephone Encounter (Signed)
Rhonda [name] from [facility] called to see status of Bone Density order. Advised that it has been ordered, Pt is wanting to wait to schedule until she speaks with PCP. Lyla Son is wondering if we an reach out to Pt to see what her hesitations are, they are recommending her test to be done no later than 06/17/21 for the best indication of her osteoporosis.

## 2021-05-14 NOTE — Telephone Encounter (Signed)
Per patient, ok to shedule.

## 2021-05-14 NOTE — Telephone Encounter (Signed)
 Informed pt, pt states she didn't request the refill, it was maybe the pharmacy.

## 2021-05-22 ENCOUNTER — Inpatient Hospital Stay: Admit: 2021-05-22 | Payer: MEDICARE | Attending: Internal Medicine | Primary: Internal Medicine

## 2021-05-22 DIAGNOSIS — Z780 Asymptomatic menopausal state: Secondary | ICD-10-CM

## 2021-05-26 NOTE — Telephone Encounter (Signed)
Let her know her DEXa scan showed osteopenia which is mildly low bone mass, but not osteoporosis. She can continue to be physically active and take OTC women's above 50 calcium and multivitamin tablets.

## 2021-05-26 NOTE — Telephone Encounter (Signed)
-----   Message from [employer] [place] sent at 05/26/2021 10:55 AM EDT -----  Regarding: Bone scan  I had my bone scan done on Fri. afternoon and the tech. told me that there would be a report on MyChart-either late that afternoon, or maybe the first of the week.  I got a message that there were results for me on MyChart late Fri. afternoon, but when I opened it there was no message.  I'm not concerned, just wondered what happened.  Thanks, Rhonda [name]

## 2021-05-28 ENCOUNTER — Other Ambulatory Visit (INDEPENDENT_AMBULATORY_CARE_PROVIDER_SITE_OTHER): Payer: Self-pay

## 2021-06-10 ENCOUNTER — Other Ambulatory Visit (INDEPENDENT_AMBULATORY_CARE_PROVIDER_SITE_OTHER): Payer: Self-pay | Admitting: Physician Assistant

## 2021-06-10 DIAGNOSIS — G4700 Insomnia, unspecified: Secondary | ICD-10-CM

## 2021-06-10 MED ORDER — TRAZODONE HCL 50 MG OR TABS
ORAL_TABLET | ORAL | 2 refills | Status: DC
Start: 2021-06-10 — End: 2022-01-12

## 2021-06-12 ENCOUNTER — Other Ambulatory Visit (INDEPENDENT_AMBULATORY_CARE_PROVIDER_SITE_OTHER): Payer: Self-pay | Admitting: Physician Assistant

## 2021-06-12 DIAGNOSIS — K589 Irritable bowel syndrome without diarrhea: Secondary | ICD-10-CM

## 2021-06-12 MED ORDER — DIPHENOXYLATE-ATROPINE 2.5-0.025 MG OR TABS
ORAL_TABLET | ORAL | 0 refills | Status: DC
Start: 2021-06-12 — End: 2022-01-12

## 2021-07-27 ENCOUNTER — Telehealth

## 2021-07-27 NOTE — Telephone Encounter (Signed)
Randa Evens, how can we send a prescription for a new CPAP machine again?

## 2021-07-27 NOTE — Telephone Encounter (Signed)
-----   Message from Rhonda [name] sent at 07/20/2021 11:13 AM EDT -----  Regarding: FW: sleep apnea    ----- Message -----  From: Rhonda [name]  Sent: 07/20/2021  10:55 AM EDT  To: [facility]  Subject: sleep apnea                                      I called [employer] ins. and asked them about our cpap machines.  Ours were labeled as unsafe a year ago June.  We've called many times and they just tell us there is a long list (about 3000) waiting for replacements.  The insurance company checked our info and said they will definitely pay to have them   replaced if the dr. will write a prescription to whatever medical facility he prefers.  There may be paperwork from the company for the dr. and [place].  We are wondering if the lack of good sleep is what's causing Rhonda [name] to be so tired all the time?  We'd both like to have them again.  Could Rhonda [name] check for [place]?  Thanks!

## 2021-07-28 NOTE — Telephone Encounter (Signed)
I am not sure. Can we ask her? Does she need a new one?

## 2021-07-28 NOTE — Telephone Encounter (Signed)
Do you know when pts last sleep study was done.

## 2021-07-28 NOTE — Telephone Encounter (Signed)
Is this for both husband and wife. I have qued up the order for hers.

## 2021-07-28 NOTE — Telephone Encounter (Signed)
Order signed. Can you ask her if husband wants the same thing done?

## 2021-07-30 NOTE — Telephone Encounter (Signed)
Already faxed back.

## 2021-07-30 NOTE — Telephone Encounter (Signed)
Stephanie from [facility] called and stated that they had faxed over forms; the script for the cpap, diagnostics study, ordering visits prior and the office visit notes regarding the CPAP therapy. If these have not been received please call stephanie   [phone]    Fax: [phone]

## 2021-08-03 NOTE — Telephone Encounter (Signed)
She probably needs an office visit for it. Please give her an appointment to discuss her CPAP usage.

## 2021-08-03 NOTE — Telephone Encounter (Signed)
Judeth Cornfield called back, she states that the 4/14 OV note did not have the information she was looking for. She states she needs the OV note that discusses CPAP therapy, usage, and benefit of the machine. Please fax to: [phone]. Please advise    Cb with any questions [phone]

## 2021-08-12 ENCOUNTER — Other Ambulatory Visit (INDEPENDENT_AMBULATORY_CARE_PROVIDER_SITE_OTHER): Payer: Self-pay | Admitting: Family

## 2021-08-12 DIAGNOSIS — E785 Hyperlipidemia, unspecified: Secondary | ICD-10-CM

## 2021-08-12 MED ORDER — SIMVASTATIN 10 MG OR TABS
10.0000 mg | ORAL_TABLET | Freq: Every evening | ORAL | 3 refills | Status: AC
Start: 2021-08-12 — End: ?

## 2021-08-13 NOTE — Telephone Encounter (Signed)
Stephanie from [facility] called regarding the OV notes request made on 9/12 there's a note from Dr. Glade Lloyd saying that another OV needed to be scheduled; pt has an upcoming appt on 08/26/21 in which she can discuss the cpap therapy usage and benefit. OV notes need to be faxed to [facility] after at [phone].

## 2021-08-13 NOTE — Telephone Encounter (Signed)
Ok noted.

## 2021-08-26 ENCOUNTER — Ambulatory Visit: Admit: 2021-08-26 | Discharge: 2021-08-26 | Payer: MEDICARE | Attending: Internal Medicine | Primary: Internal Medicine

## 2021-08-26 ENCOUNTER — Encounter: Attending: Internal Medicine | Primary: Internal Medicine

## 2021-08-26 ENCOUNTER — Ambulatory Visit: Payer: MEDICARE | Primary: Internal Medicine

## 2021-08-26 DIAGNOSIS — I10 Essential (primary) hypertension: Secondary | ICD-10-CM

## 2021-08-26 NOTE — Progress Notes (Signed)
[facility]   [address]  [place] [address]    ASSESSMENT AND PLAN     1. Hypertension, benign  BP is stable.  - Comprehensive Metabolic Panel; Future  - Magnesium; Future    2. Obstructive sleep apnea syndrome  She requires CPAP replacement for better sleep. She was averaging 8 hours of CPAP use everyday and sleep was restorative.     3. GERD without esophagitis  Stable.     4. Uncomplicated asthma, unspecified asthma severity, unspecified whether persistent  Breathing is stable.    5. S/P rotator cuff repair  I will provider her right shoulder exercises which she can do at home to improve flexibility.    6. Acute pain of right knee  Tenderness along the medial joint line.?Arthritic. I will obtain a X ray today.   - XR KNEE RIGHT (1-2 VIEWS); Future       CHIEF COMPLAINT   Rhonda [name] is a 75 y.o. female who presents to clinic for   Chief Complaint   Patient presents with    Hypertension    Sleep Apnea    Shoulder Pain    Knee Pain        HPI   Rhonda [name] is here for a follow up visit.   She has yet to receive a replacement of her CPAP machine. Sleep is fragmented and not restorative. She averaged 8 hours of CPAP usage every night.   BP is stable.   She states she continues to have occasional right shoulder pain especially when she is reaching out or using a seat belt. H/O rotator cuff repair.   Denies pain at other times. She is able to move her arms without discomfort.   She states her right knee is starting to hurt. Pain is over the medial side and occasionally it acts up. She has not noted any swellnig or redness.   GERD is stable on TUMS as needed.  Her breathing is stable.       Current Outpatient Medications:     acetaminophen (TYLENOL) 650 MG extended release tablet, Take 650 mg by mouth every 6 hours as needed, Disp: , Rfl:     ascorbic acid (VITAMIN C) 250 MG tablet, Vitamin C 250 mg tablet  Take 1 tablet every day by oral route., Disp: , Rfl:     calcium carbonate-vitamin D  (CALTRATE) 600-400 MG-UNIT TABS per tab, Take 1 tablet by mouth daily, Disp: , Rfl:     cyclobenzaprine (FLEXERIL) 5 MG tablet, Take 5 mg by mouth 3 times daily as needed, Disp: , Rfl:     turmeric 500 MG CAPS, Take 500 mg by mouth daily Gummy chewable. 2 per day, Disp: , Rfl:     albuterol sulfate HFA (PROVENTIL;VENTOLIN;PROAIR) 108 (90 Base) MCG/ACT inhaler, Inhale 2 puffs into the lungs every 6 hours as needed, Disp: , Rfl:     cyanocobalamin 1000 MCG tablet, Take 1,000 mcg by mouth daily, Disp: , Rfl:     fluticasone-salmeterol (ADVAIR) 250-50 MCG/ACT AEPB diskus inhaler, Inhale 1 puff into the lungs 2 times daily, Disp: , Rfl:     hydroCHLOROthiazide (HYDRODIURIL) 25 MG tablet, Take 25 mg by mouth daily, Disp: , Rfl:     ibuprofen (ADVIL;MOTRIN) 200 MG CAPS, Take 2 capsules by mouth every 4 hours as needed, Disp: , Rfl:     ipratropium-albuterol (DUONEB) 0.5-2.5 (3) MG/3ML SOLN nebulizer solution, Inhale 3 mLs into the lungs every 6 hours as needed, Disp: ,  Rfl:     lisinopril (PRINIVIL;ZESTRIL) 40 MG tablet, Take 80 mg by mouth daily, Disp: , Rfl:     meloxicam (MOBIC) 15 MG tablet, Take 7.5 mg by mouth 2 times daily, Disp: , Rfl:      Active Problem List, Past Medical, Surgical, Family, Social History and Allergies reviewed and updated.        REVIEW OF SYSTEMS   Review of Systems   Constitutional:  Positive for fatigue. Negative for fever.   Respiratory:  Negative for cough and shortness of breath.    Cardiovascular:  Negative for chest pain.   Musculoskeletal:  Positive for arthralgias. Negative for back pain.   Psychiatric/Behavioral:  The patient is not nervous/anxious.       PHYSICAL EXAM   BP 134/80    Pulse 75    Resp 16    Ht 5\' 6"  (1.676 m)    Wt 185 lb (83.9 kg)    BMI 29.86 kg/m??       Physical Exam  Vitals reviewed.   Constitutional:       Appearance: Normal appearance.   Cardiovascular:      Rate and Rhythm: Normal rate and regular rhythm.   Pulmonary:      Breath sounds: Normal breath sounds. No  wheezing or rhonchi.   Abdominal:      General: Bowel sounds are normal.      Palpations: Abdomen is soft.   Musculoskeletal:      Comments: Right knee: normal alignment                     Mild tenderness over the medial joint line                     No tenderness over the patella, supra patellar bursa,femoral ligament or                      tibial tuberosity.                     Varus/Valgus test:-ve   Neurological:      General: No focal deficit present.      Mental Status: She is alert and oriented to person, place, and time. Mental status is at baseline.        LABS/IMAGING     No results found for any visits on 08/26/21.

## 2021-08-27 ENCOUNTER — Inpatient Hospital Stay: Admit: 2021-08-27 | Payer: MEDICARE | Primary: Internal Medicine

## 2021-08-27 ENCOUNTER — Encounter

## 2021-08-27 DIAGNOSIS — I10 Essential (primary) hypertension: Secondary | ICD-10-CM

## 2021-08-27 DIAGNOSIS — M25561 Pain in right knee: Secondary | ICD-10-CM

## 2021-08-27 LAB — COMPREHENSIVE METABOLIC PANEL
ALT: 23 U/L (ref 3–35)
AST: 27 U/L (ref 15–40)
Albumin/Globulin Ratio: 1.4
Albumin: 4 g/dL (ref 3.5–5.0)
Alk Phosphatase: 66 U/L (ref 35–100)
Anion Gap: 10 mmol/L
BUN: 22 MG/DL — ABNORMAL HIGH (ref 7–20)
Bun/Cre Ratio: 20
CO2: 31 mmol/L (ref 20–32)
Calcium: 9.5 MG/DL (ref 8.8–10.5)
Chloride: 98 mmol/L — ABNORMAL LOW (ref 100–110)
Creatinine: 1.12 MG/DL (ref 0.40–1.20)
Est, Glom Filt Rate: 52 mL/min/{1.73_m2}
Globulin: 2.8 g/dL
Glucose: 84 mg/dL (ref 75–110)
Potassium: 4.2 mmol/L (ref 3.5–5.0)
Sodium: 135 mmol/L (ref 135–145)
Total Bilirubin: 0.7 mg/dL (ref 0.10–1.20)
Total Protein: 6.8 g/dL (ref 6.2–8.0)

## 2021-08-27 LAB — MAGNESIUM: Magnesium: 1.9 mg/dL (ref 1.7–2.5)

## 2021-08-31 NOTE — Telephone Encounter (Signed)
Ok noted.

## 2021-08-31 NOTE — Telephone Encounter (Signed)
Stephanie from [facility] called to follow up on a form she sent via fax on 10/20. Nothing shown in chart, she will be refaxing this today.

## 2021-09-01 NOTE — Telephone Encounter (Signed)
All set.

## 2021-09-01 NOTE — Telephone Encounter (Signed)
-----   Message from Harrington Challenger, MD sent at 08/26/2021  4:16 PM EDT -----  Regarding: FW: Ref. my appt. this morning  Can you update her list?  ----- Message -----  From: Lavone Orn, MA  Sent: 08/26/2021   1:15 PM EDT  To: Harrington Challenger, MD  Subject: FW: Ref. my appt. this morning                     ----- Message -----  From: Annye Asa  Sent: 08/26/2021  10:21 AM EDT  To: [facility]  Subject: Ref. my appt. this morning                       On the printout it says I have stopped taking pantoprazole, 1/day  and I have not, I am also still taking lisonopril-hydrochlorothizaide 25 mg. Day      I am not taking cyanocobalamin 1000mg /day and am not taking cyclobenzaprine (flexeril) 5mg .-3x/day.  I think both of these were short term.     Matched the list of what I am taking with the actual meds when I got home-then remembered I had a list in my pocket!!  Could you please adjust my chart.  Thanks.  

## 2021-09-20 ENCOUNTER — Telehealth

## 2021-09-20 NOTE — Telephone Encounter (Signed)
Let her know there is a referral in place to see Dr. Clent Ridges. She needs to call [facility] and see when she can be seen.  What is her other concern about [facility]?  Also, can you paste her husband's concern on his chart and open a separate phone note?

## 2021-09-20 NOTE — Telephone Encounter (Signed)
-----   Message from Lavone Orn, [place] sent at 09/17/2021  1:32 PM EST -----  Regarding: FW: following up    ----- Message -----  From: Annye Asa  Sent: 09/17/2021  12:37 PM EST  To: [facility]  Subject: following up                                     I had my knee xray done and had asked for a referral to Dr. Lars Pinks...  and haven't heard anything about that?    Also neither Phil nor I have heard anything about the referral to [facility]?    Phil also asked for a referral for an MRI of his back to determine what's causing the pain and haven't heard about  any that either?  Can you give [place] an update?  Thanks.   Britta Mccreedy and [employer]

## 2021-09-22 NOTE — Telephone Encounter (Signed)
Sent mychart message to pt

## 2021-09-29 NOTE — Telephone Encounter (Signed)
Pt is following up on ortho referral. She [place] [facility] who states they haven't received the referral yet.   I have re faxed this today at 3:50pm.  Pt also states that ortho advised that Dr Clent Ridges is booking out quite far and she may need to consider an different provider to be seen sooner.     Please advise who you would suggest   [phone]

## 2021-10-04 NOTE — Telephone Encounter (Signed)
The other option is [facility]. Can you ask her if she wishes to go there? I do not know how long the wait time is though.

## 2021-10-05 NOTE — Telephone Encounter (Signed)
Sent pt mychart message.

## 2021-10-07 NOTE — Telephone Encounter (Signed)
COUGH & COLD  Caller and Callback #: spouse and Call back #: [phone]  Complaint:   Chief Complaint   Patient presents with    Appointment Requested    URI     Pt husband called and said that they both have had a cough, congestion for several weeks now. They have been taking OTC nyquil and dayquil and cough medication daily and that helps them sleep but they are still having the cough and congestions. They have tested for covid and its negative. They want to get checked out. Scheduled tomorrow in [facility].       Date symptoms started: several weeks ago       Reported Symptoms   Fever [] Y   [x] N   Chills [] Y   [x] N   Fatigue [] Y   [x] N   Body aches [] Y   [x] N   Headache [] Y   [x] N   Congestion [x] Y   [] N   Loss of smell [] Y   [x] N   Sore throat [] Y   [x] N   Cough [x] Y   [] N   N/V [] Y   [x] N   Diarrhea [] Y   [x] N   SpO2 < 94% [] Y   [x] N     Tested for COVID? Yes  Test date:   Test results: Negative    RED FLAGS - If Yes, transfer call to MA or instruct to call 911  Chest pain or pressure not related to coughing [] Y  [x] N   Reports struggling to breathe [] Y  [x] N   Unable to speak in full sentences due to difficulty breathing [] Y  [x] N   Passed out or fainted today [] Y  [x] N   New numbness, weakness, slurred speech, confusion or vision loss [] Y  [x] N       Instructions to patient: We have a new check in procedure when you arrive. Park in the spots with a red sign and check in by phone. The number to call is . The number to call is on the red parking sign. The clinic staff on the phone will help you get to your exam room quickly. This will limit exposure to other patients and keep everyone safe. Please take a home COVID test before your appt, if you have one available. If you don't, you can still come to your appt.  Do you have any questions?        COVID-19 RED FLAGS  []  Transferred call to COVID-19 Clinic ([address]) or PCP/MA  []  Instructed to call 911    Wants testing only   []  Routed to COVID-19 Clinic ([address])    Wants COVID clinic appointment  [x]  Routed to COVID team for additional evaluation and decision making    Wants PCP advice  []  Routed to PCP/MA team     Wants PCP appointment  []  Routed to PCP/MA team

## 2021-10-07 NOTE — Telephone Encounter (Signed)
Noted.

## 2021-10-08 ENCOUNTER — Ambulatory Visit
Admit: 2021-10-08 | Discharge: 2021-10-08 | Payer: MEDICARE | Attending: Physician Assistant | Primary: Internal Medicine

## 2021-10-08 ENCOUNTER — Encounter

## 2021-10-08 ENCOUNTER — Inpatient Hospital Stay: Admit: 2021-10-08 | Payer: MEDICARE | Primary: Internal Medicine

## 2021-10-08 DIAGNOSIS — Z1159 Encounter for screening for other viral diseases: Secondary | ICD-10-CM

## 2021-10-08 DIAGNOSIS — J019 Acute sinusitis, unspecified: Secondary | ICD-10-CM

## 2021-10-08 DIAGNOSIS — Z20822 Contact with and (suspected) exposure to covid-19: Secondary | ICD-10-CM

## 2021-10-08 LAB — COVID-19, FLU A/B, AND RSV COMBO
Influenza A by PCR: NEGATIVE
Influenza B by PCR: NEGATIVE
RSV by PCR: NEGATIVE
SARS-CoV-2, PCR: NEGATIVE

## 2021-10-08 MED ORDER — LISINOPRIL 40 MG PO TABS
40 MG | ORAL_TABLET | Freq: Every day | ORAL | 3 refills | Status: DC
Start: 2021-10-08 — End: 2022-02-17

## 2021-10-08 MED ORDER — AMOXICILLIN 875 MG PO TABS
875 MG | ORAL_TABLET | Freq: Three times a day (TID) | ORAL | 0 refills | Status: AC
Start: 2021-10-08 — End: 2021-10-18

## 2021-10-08 NOTE — Progress Notes (Signed)
[facility] INTERNAL MEDICINE   [address] [facility]  [place] [address]    CHIEF COMPLAINT     Chief Complaint   Patient presents with    URI     Patient presents today with coughing, chest congestion, body aches x month.        HPI   Rhonda [name] is a 75 y.o. female seen in Same Day Care today accompanied by her husband Michele Mcalpine.  This began 4 weeks ago with increase stuffy nose and sneezing.   She feels that phlegm has settled in her chest for the last 3 weeks.    She has been coughing up phlegm, most of the phlegm is green , sometimes it's clear.   Not having sore throats.    He nose has been runny but too stuffy.   Her energy level has been lower.    Denies fever, chills or sweats    She taken Dyquil, Nyquil and Robitussin DM.      Non-smoker.    This patient has received both Covid-19 vaccines, and the 2nd vaccine was received by the patient over 2 weeks ago.   She has also received all 3 boosters.                   Active Problem List, Medications, Past Medical and Allergies reviewed and updated.    REVIEW OF SYSTEMS   See HPI    PHYSICAL EXAM     Vitals:    10/08/21 0947   BP: (!) 157/94   Pulse: 76   Temp: 98 ??F (36.7 ??C)   SpO2: 98%        Physical Exam  Vitals reviewed.   Constitutional:       Appearance: Normal appearance.      Comments: Body Habitus: Mild overweight  Gen:  Exhibits a frequent wet sounding cough.   HENT:      Head: Normocephalic and atraumatic.      Right Ear: Tympanic membrane normal.      Left Ear: Tympanic membrane normal.      Mouth/Throat:      Mouth: Mucous membranes are moist.      Pharynx: No oropharyngeal exudate or posterior oropharyngeal erythema.   Eyes:      Conjunctiva/sclera: Conjunctivae normal.   Cardiovascular:      Rate and Rhythm: Normal rate and regular rhythm.      Heart sounds: Normal heart sounds.   Pulmonary:      Effort: Pulmonary effort is normal.      Breath sounds: Normal breath sounds. No wheezing, rhonchi or rales.   Musculoskeletal:         General: Normal range of  motion.      Cervical back: Neck supple.   Lymphadenopathy:      Cervical: No cervical adenopathy.   Skin:     General: Skin is warm and dry.   Neurological:      General: No focal deficit present.      Mental Status: She is alert and oriented to person, place, and time.   Psychiatric:         Mood and Affect: Mood normal.         Thought Content: Thought content normal.         Judgment: Judgment normal.            LABS/IMAGING     No results found for any visits on 10/08/21.        ASSESSMENT  AND PLAN   1. Acute non-recurrent sinusitis, unspecified location        Patient Instructions   You were tested for the Covid-19 virus today with a PCR test, which is considered more accurate than a Rapid Covid test.     You were also tested today for Influenza and Respiratory Syncytial Virus (RSV).    The results will be completed by early this evening or by first thing tomorrow morning.    However, if you have worsening symptoms or feel you cannot breath well enough, then do not hesitate to leave your house to go to the emergency room or seek medical care.    As a general rule, if you do not hear from anyone about your COVID-19 test result within 48 hours after your test, then you can assume you do not have COVID-19.    Also, your COVID-19 result will be posted on MyChart.   If you have set up access to MyChart, you can check to see your COVID-19 result as soon as it is posted.    If you do not use MyChart, you may call the office of your primary care provider tomorrow mornng, and they can tell you the result of your Covid-19 swab done today.   If it is a weekend or holiday, you will have to wait to call on the first business day of the upcoming week.    For detailed Covid-19 information,  visit the CDC website: http://www.johnson-fowler.biz/       You have a sinus infection    Start Amoxicillin 875mg  tablets.   Take one pill 3 times a day for 10 days.    In order to keep your mucous thinner and more  runny so it doesn't clog up, take Guaifenesin 400mg  pills three times a day, or 600mg  twice a day    If your condition does not improve or worsens after several days, call me, , at [phone].    When you leave a message, please clearly state both your first and last name.  If possible, try to tell me the date you were seen by me.       No follow-up provider specified.  Future Appointments   Date Time Provider Department Center   12/21/2021  9:00 AM Humberto Leep, MD [facility]       Kyriakos Babler [phone] [place], Harrington Challenger  10/08/2021

## 2021-10-08 NOTE — Patient Instructions (Addendum)
You were tested for the Covid-19 virus today with a PCR test, which is considered more accurate than a Rapid Covid test.     You were also tested today for Influenza and Respiratory Syncytial Virus (RSV).    The results will be completed by early this evening or by first thing tomorrow morning.    However, if you have worsening symptoms or feel you cannot breath well enough, then do not hesitate to leave your house to go to the emergency room or seek medical care.    As a general rule, if you do not hear from anyone about your COVID-19 test result within 48 hours after your test, then you can assume you do not have COVID-19.    Also, your COVID-19 result will be posted on MyChart.   If you have set up access to MyChart, you can check to see your COVID-19 result as soon as it is posted.    If you do not use MyChart, you may call the office of your primary care provider tomorrow mornng, and they can tell you the result of your Covid-19 swab done today.   If it is a weekend or holiday, you will have to wait to call on the first business day of the upcoming week.    For detailed Covid-19 information,  visit the CDC website: http://www.johnson-fowler.biz/       You have a sinus infection    Start Amoxicillin 875mg  tablets.   Take one pill 3 times a day for 10 days.    In order to keep your mucous thinner and more runny so it doesn't clog up, take Guaifenesin 400mg  pills three times a day, or 600mg  twice a day    If your condition does not improve or worsens after several days, call me, , at [phone].    When you leave a message, please clearly state both your first and last name.  If possible, try to tell me the date you were seen by me.

## 2021-10-08 NOTE — Telephone Encounter (Signed)
Medications Requested:  Requested Prescriptions     Pending Prescriptions Disp Refills    lisinopril (PRINIVIL;ZESTRIL) 40 MG tablet [Pharmacy Med Name: LISINOPRIL 40MG  TABLETS] 180 tablet      Sig: TAKE 2 TABLETS BY MOUTH DAILY       Preferred Pharmacy:   [facility] - [place], [place] - [address] - [phone]  [address]  [place] [phone] [place]  Phone: [phone] Fax: [phone]        Last appt @ PCP Office: 08/26/2021     Future Appointments   Date Time Provider Department Center   10/08/2021 10:00 AM 14/11/2020 [place], PA [facility]   12/21/2021  9:00 AM 12/23/2021, MD [facility]       MOST RECENT BLOOD PRESSURES  BP Readings from Last 3 Encounters:   08/26/21 134/80   02/19/21 (!) 140/80   08/14/20 129/78         MOST RECENT LAB DATA  Lab Results   Component Value Date/Time    K 4.2 08/27/2021 10:55 AM    ALT 23 08/27/2021 10:55 AM    CHOL 174 02/23/2021 07:20 AM    HGB 12.5 02/23/2021 07:20 AM    HCT 37.8 02/23/2021 07:20 AM

## 2021-10-12 ENCOUNTER — Encounter

## 2021-10-14 MED ORDER — MELOXICAM 15 MG PO TABS
15 MG | ORAL_TABLET | Freq: Two times a day (BID) | ORAL | 1 refills | Status: DC
Start: 2021-10-14 — End: 2021-12-28

## 2021-10-14 MED ORDER — FLUTICASONE-SALMETEROL 250-50 MCG/ACT IN AEPB
250-50 MCG/ACT | Freq: Two times a day (BID) | RESPIRATORY_TRACT | 1 refills | Status: DC
Start: 2021-10-14 — End: 2021-10-19

## 2021-10-14 NOTE — Telephone Encounter (Signed)
-----   Message from [employer]. Yeung sent at 10/14/2021 10:04 AM EST -----  Regarding: refills  I will soon be out of fluticasone inhaler and meloxicam.  When I tried to ask for a refill on chart, it says none of my prescriptions will be refilled.  I did call through and speak to someone who said she'd get the message to you?  Just wondering why they "refill" won't go through.  Thanks.  Antonietta Jewel

## 2021-10-14 NOTE — Telephone Encounter (Signed)
-----   Message from [employer]. Minshall sent at 10/14/2021 10:04 AM EST -----  Regarding: refills  I will soon be out of fluticasone inhaler and meloxicam.  When I tried to ask for a refill on chart, it says none of my prescriptions will be refilled.  I did call through and speak to someone who said she'd get the message to you?  Just wondering why they "refill" won't go through.  Thanks.  Rhonda [name]

## 2021-10-14 NOTE — Telephone Encounter (Signed)
RHODESIA STANGER  1946-05-02      Call back needed: No    Preferred call back number: Home phone  [phone] (home)    Telephone Information:   Mobile [phone]        Medications Requested:  Requested Prescriptions     Pending Prescriptions Disp Refills    meloxicam (MOBIC) 15 MG tablet 30 tablet      Sig: Take 0.5 tablets by mouth 2 times daily    fluticasone-salmeterol (ADVAIR) 250-50 MCG/ACT AEPB diskus inhaler 60 each      Sig: Inhale 1 puff into the lungs 2 times daily       Preferred Pharmacy:   [facility] [address] [place] - [address] - P [phone] Carmon Ginsberg [phone]  [address]  [place] [address]  Phone: [phone] Fax: [phone]

## 2021-10-15 NOTE — Telephone Encounter (Signed)
Pt called back regarding this medication. I looked under medications and it looks like it was sent yesterday to pharmacy? Please advise,    [phone]

## 2021-10-19 MED ORDER — FLUTICASONE-SALMETEROL 250-50 MCG/ACT IN AEPB
250-50 MCG/ACT | Freq: Two times a day (BID) | RESPIRATORY_TRACT | 3 refills | Status: DC
Start: 2021-10-19 — End: 2021-10-20

## 2021-10-19 NOTE — Telephone Encounter (Signed)
Regarding: refills  Hello  Rhonda [name],  I called the pharmacy last night.  They still tell me they have no prescription for me for Fluticasone Propionate/Salmeterol Disk.  I will be out by [facility]. night.  The pharmacy associate asked me to hold the line for a minute and came back and said they could issue me one disc, but I usually get three at a time.  Could you please check on my prescription?  They did have the one prescription that originally was ordered at the same time, and another that was on file for a few days after that.  This seems to be the only one that is not showing up.   I don't think the problem is on your end, but hope you can solve this.  Thanks.  Rhonda [name]

## 2021-10-19 NOTE — Telephone Encounter (Signed)
Spoke with pt's spouse in regards to his own concerns in separate encounter. But, he wanted to make Dr. Glade Lloyd aware that pt's refill needs to be for 3 months due to the cost.     Script has been queued up. Sent to PCP to review and advise.

## 2021-10-20 MED ORDER — FLUTICASONE-SALMETEROL 250-50 MCG/ACT IN AEPB
250-50 MCG/ACT | Freq: Two times a day (BID) | RESPIRATORY_TRACT | 3 refills | Status: DC
Start: 2021-10-20 — End: 2021-11-12

## 2021-10-20 NOTE — Telephone Encounter (Signed)
Not on protocol list.

## 2021-11-05 ENCOUNTER — Inpatient Hospital Stay: Admit: 2021-11-05 | Payer: MEDICARE | Primary: Internal Medicine

## 2021-11-05 DIAGNOSIS — M1711 Unilateral primary osteoarthritis, right knee: Secondary | ICD-10-CM

## 2021-11-12 MED ORDER — FLUTICASONE-SALMETEROL 250-50 MCG/ACT IN AEPB
250-50 MCG/ACT | Freq: Two times a day (BID) | RESPIRATORY_TRACT | 3 refills | Status: AC
Start: 2021-11-12 — End: 2022-02-23

## 2021-11-12 NOTE — Telephone Encounter (Signed)
Very High  Allergy/Contraindication: fluticasone-salmeterol  Reactions: Other (See Comments). No reaction type specified. User documented allergy severity: High.  Drug Class Match with METHYLPREDNISOLONE (Class: CORTICOSTEROIDS).  "Pt states that she has a reaction "  DetailsOverride reason  Press F5 to access options      fluticasone-salmeterol (ADVAIR) 250-50 MCG/ACT AEPB diskus inhaler  Prescription. Reordered. Long-term.  Very High  Allergy/Contraindication: fluticasone-salmeterol  No reactions specified. No reaction type specified. User documented allergy severity: High.  Drug Class Match with PREDNISONE (Class: CORTICOSTEROIDS).  "Other reaction(s): Not Reported This Time"  DetailsOverride reason  Press F5 to access options

## 2021-11-12 NOTE — Telephone Encounter (Signed)
Regarding: refills  I will soon be out of fluticasone propionate disc.  Last time I needed a renewal the order was for one disc-and couldn't be adjusted to the 90 day supply I've been having.  Could the prescription please be changed back to the 90 day supply, which is cheaper for me.  Thanks.  [facility] in [place] is our pharmacy

## 2021-11-23 ENCOUNTER — Other Ambulatory Visit: Payer: Self-pay

## 2021-11-30 ENCOUNTER — Encounter (INDEPENDENT_AMBULATORY_CARE_PROVIDER_SITE_OTHER): Payer: Self-pay

## 2021-11-30 ENCOUNTER — Ambulatory Visit (INDEPENDENT_AMBULATORY_CARE_PROVIDER_SITE_OTHER)

## 2021-11-30 VITALS — BP 140/72 | HR 74 | Temp 96.1°F | Resp 16 | Ht 62.0 in | Wt 161.0 lb

## 2021-12-08 NOTE — Telephone Encounter (Signed)
Medications Requested: - Refused as refills were sent to pharmacy on 11/12/21      Requested Prescriptions     Pending Prescriptions Disp Refills    fluticasone-salmeterol (ADVAIR) 250-50 MCG/ACT AEPB diskus inhaler 3 each 3     Sig: Inhale 1 puff into the lungs 2 times daily       Preferred Pharmacy:   [facility] [address] [place] - [address] - P [phone] Carmon Ginsberg [phone]  [address]  [place] [address]  Phone: [phone] Fax: [phone]      Date of Last Refill: 11/12/21    Prescription Refill Protocol reviewed: Yes    Allergy List Reviewed and Verified: Yes    Possible medication to medication interactions reviewed: Yes    Last appt @ PCP Office: 10/08/2021     Future Appointments   Date Time Provider Department Center   12/21/2021  9:00 AM Harrington Challenger, MD [facility]       MOST RECENT BLOOD PRESSURES  BP Readings from Last 3 Encounters:   10/08/21 (!) 157/94   08/26/21 134/80   02/19/21 (!) 140/80         MOST RECENT LAB DATA  Lab Results   Component Value Date/Time    K 4.2 08/27/2021 10:55 AM    ALT 23 08/27/2021 10:55 AM    CHOL 174 02/23/2021 07:20 AM    HGB 12.5 02/23/2021 07:20 AM    HCT 37.8 02/23/2021 07:20 AM

## 2021-12-09 DEATH — deceased

## 2021-12-11 ENCOUNTER — Encounter

## 2021-12-11 ENCOUNTER — Ambulatory Visit
Admit: 2021-12-11 | Discharge: 2021-12-11 | Payer: MEDICARE | Attending: Physician Assistant | Primary: Internal Medicine

## 2021-12-11 ENCOUNTER — Inpatient Hospital Stay: Admit: 2021-12-11 | Payer: MEDICARE | Primary: Internal Medicine

## 2021-12-11 ENCOUNTER — Encounter (INDEPENDENT_AMBULATORY_CARE_PROVIDER_SITE_OTHER): Payer: Self-pay

## 2021-12-11 DIAGNOSIS — R300 Dysuria: Secondary | ICD-10-CM

## 2021-12-11 DIAGNOSIS — R7989 Other specified abnormal findings of blood chemistry: Secondary | ICD-10-CM

## 2021-12-11 DIAGNOSIS — R7301 Impaired fasting glucose: Secondary | ICD-10-CM

## 2021-12-11 LAB — AMB POC URINALYSIS DIP STICK MANUAL W/O MICRO
Bilirubin, Urine, POC: NEGATIVE
Glucose, Urine, POC: NEGATIVE
Ketones, Urine, POC: NEGATIVE
Nitrite, Urine, POC: NEGATIVE
Specific Gravity, Urine, POC: 1.02 (ref 1.001–1.035)
Urobilinogen, POC: 0.2
pH, Urine, POC: 5 (ref 4.6–8.0)

## 2021-12-11 LAB — COMPREHENSIVE METABOLIC PANEL, BLOOD
ALT (SGPT): 177 U/L — ABNORMAL HIGH (ref 6–29)
AST (SGOT): 199 U/L — ABNORMAL HIGH (ref 10–35)
Albumin/Glob Ratio: 1.8 (calc) (ref 1.0–2.5)
Albumin: 4.3 g/dL (ref 3.6–5.1)
Alkaline Phos: 103 U/L (ref 37–153)
BUN: 20 mg/dL (ref 7–25)
Bilirubin, Total: 0.8 mg/dL (ref 0.2–1.2)
Calcium: 9.4 mg/dL (ref 8.6–10.4)
Carbon Dioxide: 29 mmol/L (ref 20–32)
Chloride: 100 mmol/L (ref 98–110)
Creatinine: 0.78 mg/dL (ref 0.60–1.00)
EGFR: 80 mL/min/{1.73_m2} (ref >=60)
Globulin: 2.4 g/dL (calc) (ref 1.9–3.7)
Glucose: 131 mg/dL — ABNORMAL HIGH (ref 65–99)
Potassium: 4.7 mmol/L (ref 3.5–5.3)
Sodium: 137 mmol/L (ref 135–146)
Total Protein: 6.7 g/dL (ref 6.1–8.1)

## 2021-12-11 LAB — URINALYSIS WITH CULTURE REFLEX, WHEN INDICATED
Bilrubin: NEGATIVE
Blood: NEGATIVE
Glucose: NEGATIVE
Hyaline Casts: NONE SEEN /LPF
Nitrite: NEGATIVE
Specific Gravity: 1.024 (ref 1.001–1.035)
pH: 5.5 (ref 5.0–8.0)

## 2021-12-11 LAB — CBC WITH DIFF, BLOOD
Abs Basophils: 53 cells/uL (ref 0–200)
Abs Eosinophils: 264 cells/uL (ref 15–500)
Abs Lymphs: 2666 cells/uL (ref 850–3900)
Abs Monocytes: 695 cells/uL (ref 200–950)
Abs Neutrophils: 5122 cells/uL (ref 1500–7800)
Basophils: 0.6 %
Eosinophils: 3 %
HCT: 44.2 % (ref 35.0–45.0)
HGB: 14.6 g/dL (ref 11.7–15.5)
Lymps: 30.3 %
MCH: 31.3 pg (ref 27.0–33.0)
MCHC: 33 g/dL (ref 32.0–36.0)
MCV: 94.6 fL (ref 80.0–100.0)
MPV: 11.3 fL (ref 7.5–12.5)
Monocytes: 7.9 %
PLT: 261 10*3/uL (ref 140–400)
RBC: 4.67 10*6/uL (ref 3.80–5.10)
RDW: 13 % (ref 11.0–15.0)
SEGS: 58.2 %
WBC: 8.8 10*3/uL (ref 3.8–10.8)

## 2021-12-11 LAB — TRIGLYCERIDES, BLOOD: Triglycerides: 180 mg/dL — ABNORMAL HIGH (ref <150)

## 2021-12-11 LAB — URINE CULTURE

## 2021-12-11 LAB — REFLEXIVE URINE CULTURE-QUEST

## 2021-12-11 LAB — NON HDL CHOLESTEROL -QUEST: Non-HDL Cholesterol: 132 mg/dL (calc) — ABNORMAL HIGH (ref <130)

## 2021-12-11 LAB — TSH W/REFLEX TO FT4-QUEST: TSH: 3.75 mIU/L (ref 0.40–4.50)

## 2021-12-11 LAB — CHOLESTEROL/HDLC RATIO-QUEST: Chol/HDLC Ratio: 4.3 (calc) (ref <5.0)

## 2021-12-11 LAB — CHOLESTEROL, TOTAL BLOOD: Cholesterol: 172 mg/dL (ref <200)

## 2021-12-11 LAB — HDL-CHOLESTEROL, BLOOD: HDL Cholesterol: 40 mg/dL — ABNORMAL LOW (ref >=50)

## 2021-12-11 LAB — LDL CHOLESTEROL, DIRECT: LDL-Cholesterol: 102 mg/dL (calc) — ABNORMAL HIGH

## 2021-12-11 MED ORDER — NITROFURANTOIN MONOHYD MACRO 100 MG PO CAPS
100 MG | ORAL_CAPSULE | Freq: Two times a day (BID) | ORAL | 0 refills | Status: AC
Start: 2021-12-11 — End: 2021-12-16

## 2021-12-11 NOTE — Telephone Encounter (Signed)
Patient is calling with UTI symptoms. Says she had symptoms a week ago and started AZO over the counter meds and as soon as she was done with those it seems to have come back even worse. Urgency to urinate, pain and burning while urinating. No blood in urine at this time. Scheduled patient with Same Day Care @ 1130 please advise patient with any questions [phone]

## 2021-12-11 NOTE — Telephone Encounter (Signed)
From: Annye Asa  To: Dr. Harrington Challenger  Sent: 12/11/2021 7:57 AM EST  Subject: UTI    I am quite sure I have a UTI. I have taken AZO according to the directions on the package and it seemed better the first day without...but no is back with an urgency to go to the bathroom, and a burning, itching symptom as well. Is there something else I should do? Is there a prescription that would help or do I need to be seen? I'm quite uncomfortable and don't know what to do next! Thanks. Rhonda [name]

## 2021-12-11 NOTE — Progress Notes (Signed)
[facility] INTERNAL MEDICINE   [address] [facility]  [place] [address]        ASSESSMENT AND PLAN   Rhonda [name] was seen today for urinary tract infection.    Diagnoses and all orders for this visit:    Dysuria  -     AMB POC URINALYSIS DIP STICK MANUAL W/O MICRO  -     Culture, Urine; Future  -     nitrofurantoin, macrocrystal-monohydrate, (MACROBID) 100 MG capsule; Take 1 capsule by mouth 2 times daily for 5 days    Pyuria-given her symptoms, I will tx her today with an antibiotic. We will notify her of her culture results.   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 female who presents to clinic for   Chief Complaint   Patient presents with    Urinary Tract Infection     Patient presents today with complaints of a persistent UTI. Patient states she was treated for it and has been off the medication 4 days ago and is still having burning and urgency.       HPI   Rhonda [name] is a pleasant patient of Harrington Challenger, MD who messaged her provider office this morning to state that she is quite certain she has urinary tract infection.  She had been taking Azo and is seen better on the 1st day but now her symptoms are back and she is having urinary urgency, burning and itching. The covering provider advised she be seen.     Rhonda [name] tells me her symptoms started 1 week ago.  She took Azo for about 3 days.  She took azo for burning urinary urgency.  Yesterday she felt pretty good.  However today her symptoms are back.  She has not seen blood.  She denies fevers chills or kidney tenderness.  She denies itching.    She also reports that when she wiped herself recently she feels something there that she has never felt before.  She wonders if she has a prolapse of  bladder or uterus.  She would like for me to examine her today.    Record Review: Telephone call from today requesting this appointment    Allergies   Allergen Reactions    Cefaclor      Other reaction(s): Not Reported This Time    Methylprednisolone Other (See Comments)     Pt states that she has a reaction     Prednisone      Other reaction(s): Not Reported This Time        Current Outpatient Medications:     fluticasone-salmeterol (ADVAIR) 250-50 MCG/ACT AEPB diskus inhaler, Inhale 1 puff into the lungs 2 times daily, Disp: 3 each, Rfl: 3    meloxicam (MOBIC) 15 MG tablet, Take 0.5 tablets by mouth 2 times daily, Disp: 30 tablet, Rfl: 1    lisinopril (PRINIVIL;ZESTRIL) 40 MG tablet, Take 2 tablets by mouth daily, Disp: 180 tablet, Rfl: 3    pantoprazole (PROTONIX) 40 MG tablet, Take 1 tablet by mouth daily, Disp: 30 tablet, Rfl:     lisinopril-hydroCHLOROthiazide (PRINZIDE;ZESTORETIC) 20-25 MG per tablet, Take 1 tablet by  mouth every morning, Disp: 30 tablet, Rfl:     acetaminophen (TYLENOL) 650 MG extended release tablet, Take 650 mg by mouth every 6 hours as needed, Disp: , Rfl:     ascorbic acid (VITAMIN C) 250 MG tablet, Vitamin C 250 mg tablet  Take 1 tablet every day by oral route., Disp: , Rfl:     calcium carbonate-vitamin D (CALTRATE) 600-400 MG-UNIT TABS per tab, Take 1 tablet by mouth daily, Disp: , Rfl:     turmeric 500 MG CAPS, Take 500 mg by mouth daily Gummy chewable. 2 per day, Disp: , Rfl:     albuterol sulfate HFA (PROVENTIL;VENTOLIN;PROAIR) 108 (90 Base) MCG/ACT inhaler, Inhale 2 puffs into the lungs every 6 hours as needed, Disp: , Rfl:     hydroCHLOROthiazide (HYDRODIURIL) 25 MG tablet, Take 25 mg by mouth daily, Disp: , Rfl:     ibuprofen (ADVIL;MOTRIN) 200 MG CAPS, Take 2 capsules by mouth every 4 hours as needed, Disp: , Rfl:     ipratropium-albuterol (DUONEB) 0.5-2.5 (3) MG/3ML SOLN nebulizer solution, Inhale 3 mLs into the lungs every 6 hours as needed, Disp: , Rfl:    There are no  discontinued medications.    Active Problem List, Past Medical, Surgical, Family, Social History and Allergies reviewed and updated in the Electronic Health Record.  REVIEW OF SYSTEMS   See HPI    PHYSICAL EXAM     Vitals:    12/11/21 1140   BP: (!) 168/82   Pulse: 88   Temp: 97.7 ??F (36.5 ??C)   SpO2: 97%        Physical Exam  Vitals reviewed.   Constitutional:       General: She is not in acute distress.     Appearance: Normal appearance. She is not ill-appearing, toxic-appearing or diaphoretic.   HENT:      Head: Normocephalic and atraumatic.   Abdominal:      General: There is no distension.      Tenderness: There is no abdominal tenderness. There is no right CVA tenderness, left CVA tenderness, guarding or rebound.   Genitourinary:     General: Normal vulva.      Vagina: No vaginal discharge.      Comments: Speculum exam: I did not detect a cystocele or rectocele. Vaginal walls very lax. Cervix was tucked posteriorly. Bimanual exam did not reveal laxity.   Skin:     General: Skin is warm.      Findings: No rash.   Neurological:      General: No focal deficit present.      Mental Status: She is alert and oriented to person, place, and time.   Psychiatric:         Mood and Affect: Mood normal.         Behavior: Behavior normal.         Thought Content: Thought content normal.         Judgment: Judgment normal.     UA today     Glucose: negative  Bili: negative  Ketones Negative   Sp Gr 1.020  Blood-trace  PH 5.0  Protein trace  Urobilinogen 0.2  Nitrates-negative  Leukocytes-moderate    LABS/IMAGING      Future Appointments   Date Time Provider Department Center   12/21/2021  9:00 AM Harrington Challenger, MD [facility]       Eveline Keto, [place]  12/11/2021      This visit was dictated using  M*Modal voice recognition software. Please excuse any errors and contact me if you have questions about specific passages that may be erroneous.

## 2021-12-11 NOTE — Patient Instructions (Signed)
Drink plenty of water 32-64 ounces a day.  Consider cranberry juice or tabs for improved urinary health  Take all of your antibiotic as prescribed.  If you do not feel improved in 3-4 days, give us a call.  If a culture was sent, we will contact you if the antibiotic dose not match the bacteria causing your infection.     Urinary Tract Infection in Women: Care Instructions  Your Care Instructions    A urinary tract infection, or UTI, is a general term for an infection anywhere between the kidneys and the urethra (where urine comes out). Most UTIs are bladder infections. They often cause pain or burning when you urinate.  UTIs are caused by bacteria and can be cured with antibiotics. Be sure to complete your treatment so that the infection goes away.  Follow-up care is a key part of your treatment and safety. Be sure to make and go to all appointments, and call your doctor if you are having problems. It's also a good idea to know your test results and keep a list of the medicines you take.  How can you care for yourself at home?  Take your antibiotics as directed. Do not stop taking them just because you feel better. You need to take the full course of antibiotics.  Drink extra water and other fluids for the next day or two. This may help wash out the bacteria that are causing the infection. (If you have kidney, heart, or liver disease and have to limit fluids, talk with your doctor before you increase your fluid intake.)  Avoid drinks that are carbonated or have caffeine. They can irritate the bladder.  Urinate often. Try to empty your bladder each time.  To relieve pain, take a hot bath or lay a heating pad set on low over your lower belly or genital area. Never go to sleep with a heating pad in place.  To prevent UTIs  Drink plenty of water each day. This helps you urinate often, which clears bacteria from your system. (If you have kidney, heart, or liver disease and have to limit fluids, talk with your doctor  before you increase your fluid intake.)  Urinate when you need to.  Urinate right after you have sex.  Change sanitary pads often.  Avoid douches, bubble baths, feminine hygiene sprays, and other feminine hygiene products that have deodorants.  After going to the bathroom, wipe from front to back.  When should you call for help?  Call your doctor now or seek immediate medical care if:    Symptoms such as fever, chills, nausea, or vomiting get worse or appear for the first time.     You have new pain in your back just below your rib cage. This is called flank pain.     There is new blood or pus in your urine.     You have any problems with your antibiotic medicine.    Watch closely for changes in your health, and be sure to contact your doctor if:    You are not getting better after taking an antibiotic for 2 days.     Your symptoms go away but then come back.   Where can you learn more?  Go to http://www.healthwise.net/GoodHelp[employer]ctions.  Enter K848 in the search box to learn more about "Urinary Tract Infection in Women: Care Instructions."  Current as of: January 25, 2017  Content Version: 11.9  ?? 2006-2018 Healthwise[employer]. Care instructions adapted under license by [employer] (which disclaims liability or warranty for this information). If you have questions about a medical condition or this instruction, always ask your healthcare professional. Healthwise, Incorporated disclaims any warranty or liability for your use of this information.

## 2021-12-11 NOTE — Telephone Encounter (Signed)
I think if quite uncomfortable as she says it would be best to be seen so she may not have to wait for treatment a few more days.

## 2021-12-13 LAB — CULTURE, URINE

## 2021-12-14 NOTE — Telephone Encounter (Signed)
Please inform patient she didn't really have a UTI on culture.  How is she feeling taking nitrofurantoin? Is she feeling improved? Thanks, Ethelda Chick, PA-C     0 Result Notes  Component 3 d ago    Special Requests NO SPECIAL REQUESTS    Total Colony Count 25,000 - 49,000 CFU/mL    Culture MIXED GRAM POSITIVE and GRAM NEGATIVE FLORA NO FURTHER WORKUP

## 2021-12-14 NOTE — Telephone Encounter (Signed)
Patient was informed of negative urine cultures. She states she is feeling better after taking nitrofurantoin. She had no questions or concerns.

## 2021-12-21 ENCOUNTER — Ambulatory Visit: Admit: 2021-12-21 | Discharge: 2021-12-21 | Payer: MEDICARE | Attending: Internal Medicine | Primary: Internal Medicine

## 2021-12-21 DIAGNOSIS — I10 Essential (primary) hypertension: Secondary | ICD-10-CM

## 2021-12-21 NOTE — Telephone Encounter (Signed)
Called pt. Pt stated and understood.

## 2021-12-21 NOTE — Progress Notes (Signed)
[facility]   [address]  [place] [address]    ASSESSMENT AND PLAN     1. Hypertension, benign  Repeat BP is 140/80 mm Hg. Will continue to monitor. Continue current medication.    2. Obstructive sleep apnea syndrome  She has not used CPAP for the past 3 years.    3. GERD without esophagitis  Continue Pantoprazole.        CHIEF COMPLAINT   Rhonda [name] is a 76 y.o. female who presents to clinic for   Chief Complaint   Patient presents with    Follow-up    Hypertension    Gastroesophageal Reflux    Sleep Apnea        HPI   Rhonda [name] is here for a follow up.  Repeat BP is 140/80 mm Hg.   She denies any s/s of ischemic chest pain,cough, SOB or dizziness.  She has not used CPAP for the past 3 years. It was part of the recall.   GERD symptoms are stable on Pantoprazole.         Current Outpatient Medications:     Multiple Vitamins-Minerals (WOMENS DAILY FORMULA) TABS, Take 1 tablet by mouth daily, Disp: , Rfl:     fluticasone-salmeterol (ADVAIR) 250-50 MCG/ACT AEPB diskus inhaler, Inhale 1 puff into the lungs 2 times daily, Disp: 3 each, Rfl: 3    meloxicam (MOBIC) 15 MG tablet, Take 0.5 tablets by mouth 2 times daily, Disp: 30 tablet, Rfl: 1    pantoprazole (PROTONIX) 40 MG tablet, Take 1 tablet by mouth daily, Disp: 30 tablet, Rfl:     acetaminophen (TYLENOL) 650 MG extended release tablet, Take 650 mg by mouth every 6 hours as needed, Disp: , Rfl:     turmeric 500 MG CAPS, Take 500 mg by mouth daily Gummy chewable. 2 per day, Disp: , Rfl:     albuterol sulfate HFA (PROVENTIL;VENTOLIN;PROAIR) 108 (90 Base) MCG/ACT inhaler, Inhale 2 puffs into the lungs every 6 hours as needed, Disp: , Rfl:     hydroCHLOROthiazide (HYDRODIURIL) 25 MG tablet, Take 25 mg by mouth daily, Disp: , Rfl:     lisinopril (PRINIVIL;ZESTRIL) 40 MG tablet, Take 2 tablets by mouth daily, Disp: 180 tablet, Rfl: 3    ipratropium-albuterol (DUONEB) 0.5-2.5 (3) MG/3ML SOLN nebulizer solution, Inhale 3 mLs into the lungs every 6  hours as needed, Disp: , Rfl:      Active Problem List, Past Medical, Surgical, Family, Social History and Allergies reviewed and updated.        REVIEW OF SYSTEMS   Review of Systems   HENT:  Negative for sore throat.    Respiratory:  Negative for cough, shortness of breath and wheezing.    Cardiovascular:  Negative for chest pain and leg swelling.   Gastrointestinal:  Negative for abdominal pain and nausea.   Neurological:  Negative for dizziness and weakness.   Psychiatric/Behavioral:  The patient is not nervous/anxious.       PHYSICAL EXAM   BP (!) 142/74 (Site: Left Upper Arm, Position: Sitting, Cuff Size: Large Adult)    Pulse 75    Resp 16    Ht 5\' 6"  (1.676 m)    BMI 30.05 kg/m??       Physical Exam  Vitals reviewed.   Constitutional:       Appearance: Normal appearance.   Eyes:      General: No scleral icterus.     Conjunctiva/sclera: Conjunctivae normal.   Cardiovascular:  Rate and Rhythm: Normal rate and regular rhythm.      Heart sounds: Normal heart sounds.   Pulmonary:      Breath sounds: Normal breath sounds. No wheezing or rhonchi.   Abdominal:      General: Bowel sounds are normal. There is no distension.      Palpations: Abdomen is soft.   Musculoskeletal:      Right lower leg: No edema.      Left lower leg: No edema.   Neurological:      General: No focal deficit present.      Mental Status: She is alert and oriented to person, place, and time. Mental status is at baseline.   Psychiatric:         Mood and Affect: Mood normal.         Behavior: Behavior normal.        LABS/IMAGING     No results found for any visits on 12/21/21.

## 2021-12-21 NOTE — Telephone Encounter (Signed)
Please let her know wat she is taking is listed on her medication list. There was a duplicate that was removed. She should continue to take her medication as she has been taking without making any changes.

## 2021-12-21 NOTE — Telephone Encounter (Signed)
-----   Message from [employer]. Mccully sent at 12/21/2021 11:28 AM EST -----  Regarding: change on my medications?  I have been taking lisinopril 40 mg. tabs-2 tabs each night and one hydrochlorothiazide 25mg . each morning.  My report from today says to no longer take the hydrochlorothiazide...? Is that right?  Please let me know.  

## 2021-12-22 ENCOUNTER — Encounter (INDEPENDENT_AMBULATORY_CARE_PROVIDER_SITE_OTHER): Payer: Self-pay | Admitting: Hospital

## 2021-12-23 ENCOUNTER — Encounter: Payer: MEDICARE | Attending: Internal Medicine | Primary: Internal Medicine

## 2021-12-24 ENCOUNTER — Telehealth (INDEPENDENT_AMBULATORY_CARE_PROVIDER_SITE_OTHER): Payer: Self-pay

## 2021-12-28 MED ORDER — MELOXICAM 15 MG PO TABS
15 MG | ORAL_TABLET | Freq: Two times a day (BID) | ORAL | 1 refills | Status: DC
Start: 2021-12-28 — End: 2022-02-16

## 2021-12-28 MED ORDER — HYDROCHLOROTHIAZIDE 25 MG PO TABS
25 MG | ORAL_TABLET | Freq: Every day | ORAL | 1 refills | Status: DC
Start: 2021-12-28 — End: 2022-06-16

## 2021-12-28 NOTE — Telephone Encounter (Signed)
HCTZ is not on medchecker    Medications Requested:  Requested Prescriptions     Pending Prescriptions Disp Refills    meloxicam (MOBIC) 15 MG tablet 30 tablet 1     Sig: Take 0.5 tablets by mouth 2 times daily    hydroCHLOROthiazide (HYDRODIURIL) 25 MG tablet 90 tablet 3     Sig: Take 1 tablet by mouth daily       Preferred Pharmacy:   [facility] [address] [place] - [address] - P [phone] Carmon Ginsberg [phone]  [address]  [place] [address]  Phone: [phone] Fax: [phone]        Last appt @ PCP Office: 12/21/2021     Future Appointments   Date Time Provider Department Center   04/28/2022  8:40 AM Harrington Challenger, MD [facility]       MOST RECENT BLOOD PRESSURES  BP Readings from Last 3 Encounters:   12/21/21 (!) 142/74   12/11/21 (!) 168/82   10/08/21 (!) 157/94         MOST RECENT LAB DATA  Lab Results   Component Value Date/Time    K 4.2 08/27/2021 10:55 AM    ALT 23 08/27/2021 10:55 AM    CHOL 174 02/23/2021 07:20 AM    HGB 12.5 02/23/2021 07:20 AM    HCT 37.8 02/23/2021 07:20 AM

## 2021-12-28 NOTE — Telephone Encounter (Signed)
-----   Message from [employer]. Callanan sent at 12/28/2021  2:15 PM EST -----  Regarding: refills  I tried to reorder meloxicam 15mg  (1/2 twice a day)and it was accepted, but  also need hydrochlorothiazzide (I also need refill on hydro chlorothiazide, qty 90, 25 mg, 1/day  and my chart wouldn't accept that...it was last refilled on 09/23/21 and I will be out by 09/25/21.  Please send a refill request to [facility] in [place].  Thanks.  [place]

## 2022-01-04 ENCOUNTER — Telehealth (INDEPENDENT_AMBULATORY_CARE_PROVIDER_SITE_OTHER): Payer: Self-pay

## 2022-01-05 ENCOUNTER — Encounter (INDEPENDENT_AMBULATORY_CARE_PROVIDER_SITE_OTHER): Payer: Self-pay | Admitting: Hospital

## 2022-01-08 NOTE — Telephone Encounter (Signed)
Filled out what I could. In folder on PCP's desk to complete.

## 2022-01-08 NOTE — Telephone Encounter (Signed)
Patient brought in her BMV form form completion for a 2nd time.  Has not used sleep apnea machine for 2 years as it was recalled and patient has been on a wait list to receive replacement.  Machine is through [facility] [employer] in [place].

## 2022-01-08 NOTE — Telephone Encounter (Signed)
Please call Jakiah once complete.

## 2022-01-12 ENCOUNTER — Other Ambulatory Visit (INDEPENDENT_AMBULATORY_CARE_PROVIDER_SITE_OTHER): Payer: Self-pay | Admitting: Physician Assistant

## 2022-01-12 DIAGNOSIS — K589 Irritable bowel syndrome without diarrhea: Secondary | ICD-10-CM

## 2022-01-12 DIAGNOSIS — G4700 Insomnia, unspecified: Secondary | ICD-10-CM

## 2022-01-12 MED ORDER — TRAZODONE HCL 50 MG OR TABS
ORAL_TABLET | ORAL | 2 refills | Status: AC
Start: 2022-01-12 — End: ?

## 2022-01-12 MED ORDER — DIPHENOXYLATE-ATROPINE 2.5-0.025 MG OR TABS
ORAL_TABLET | ORAL | 0 refills | Status: DC
Start: 2022-01-12 — End: 2022-03-29

## 2022-01-14 NOTE — Telephone Encounter (Signed)
Called the state for direction on this matter. The medical director if not in today, I will call back on Monday. 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 Her husband.Aneta Mins, got on the phone to explain that Dr. Glade Lloyd only has to sign that her sleep apnea does not effect her driving. I tried to explain that he could not do this without a sleep study. He told me I was wrong and reinnervated that Dr. Glade Lloyd is not going to risk his license over this, he then told me that he didn't want to talk to me anymore because I wasn't telling Dr. Glade Lloyd what the state told him to do, he wants to speak to Dr. Glade Lloyd.

## 2022-01-14 NOTE — Telephone Encounter (Signed)
-----   Message from [employer]. Garguilo sent at 01/14/2022  6:11 AM EST -----  Regarding: Paperwork for [employer]  I dropped off a form for the second time at your office last Friday and was assured I'd receive a call on Mon. regarding the status of my paperwork to have my license renewed.  My license depends on receiving this paperwork  to approve renewal. I've been waiting two years or more for a new breathing machine, already approved by my insurance.  The machine was one the company said was releasing cancer causing emissions and we were advised not to use them.  My husband received his last month and haven't heard anything on mine despite many calls to the company.  Could you please do my paperwork asap, I don't want my license suspended.   Rhonda [name]

## 2022-01-14 NOTE — Telephone Encounter (Signed)
Please let her know the delay is she has not been using her CPAP machine for the past 2 years due to the recall. I need to know from the state of [place] what needs to be done for such cases. Rhonda [name], let her know I cannot say she is using CPAP when she has told me she is not using them for 2 years.

## 2022-01-14 NOTE — Telephone Encounter (Signed)
Pt called back regarding this. 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 She is at risk of losing her license.     Pt needs this completed ASAP and would like a CB as soon as this has been done.    CB: [phone]

## 2022-01-14 NOTE — Telephone Encounter (Signed)
Rhonda [name] routed conversation to [facility] 7 minutes ago (9:29 AM)     Rhonda [name] 7 minutes ago (9:29 AM)     JS  Pt called back regarding this. 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 She is at risk of losing her license.      Pt needs this completed ASAP and would like a CB as soon as this has been done.     CB: [phone]         Note         Rhonda [name], Rhonda [name] [phone]  Rhonda [name] 8 minutes ago (9:28 AM)     Lavone Orn, MA routed conversation to You; Harrington Challenger, MD 16 minutes ago (9:19 AM)     Lavone Orn, MA 17 minutes ago (9:19 AM)     JR  ----- Message from [place]. Chopp sent at 01/14/2022  6:11 AM EST -----  Regarding: Paperwork for [employer]  I dropped off a form for the second time at your office last Friday and was assured I'd receive a call on Mon. regarding the status of my paperwork to have my license renewed.  My license depends on receiving this paperwork  to approve renewal. I've been waiting two years or more for a new breathing machine, already approved by my insurance.  The machine was one the company said was releasing cancer causing emissions and we were advised not to use them.  My husband received his last month and haven't heard anything on mine despite many calls to the company.  Could you please do my paperwork asap, I don't want my license suspended.   Rhonda [name]            Note

## 2022-01-14 NOTE — Telephone Encounter (Signed)
separate note.Another message to PCP

## 2022-01-14 NOTE — Telephone Encounter (Signed)
Per other note, this was placed on PCP's desk in folder for completion. Dr. Glade Lloyd have you been able to complete this?

## 2022-01-15 NOTE — Telephone Encounter (Signed)
Called and spoke with pt's husband Phillip,I listened to his concerns. He says that he really needs his wife to have her license because of the work that they do. He really doesn't think that Nadine Counts or Dr. Glade Lloyd have done anything to try to help them with this problem ever since her CPAP machine was recalled. He says that he just got his new machine last week but they have not heard anything about her machine. The company they use is [employer] they think?? They state that it's been impossible to deal with the company, they jsut tell them that there are thousands of people on the recall list and that theyw ill get to her when they get to her. I did explain that Dr. Glade Lloyd can not write on the form for the [facility] that her driving is not effected by her sleep apnea if her sleep apnea is not being treated, we have to have proof of the sleep apnea being effectively treated to be able to medically state that she is safe to drive with the condition otherwise if would make Dr. Glade Lloyd liable if anything were to ever happen while she was behind the wheel of a vehicle. He stated his understanding of this but also that he is still very frustrated with the fact that he does not think that Nadine Counts or Dr. Glade Lloyd have done anything to try to help her get a new machine and this has been going on for almost 3 years now. He also feels that Nadine Counts talks to them in a very condescending way as if they don't know anything and he has been an EMT for many years and understands a lot more than people think he does. He is almost to the point of not wanting to talk to [place] at all. He would appreciate a phone call from Dr. Glade Lloyd every once in while instead. I let him know that I would pass along his concerns and issues and that I know that they are working on finding out from the state what they are supposed to do for patients who have not been able to get a CPAP due to the recall but that I will also touch base with  Nadine Counts and Dr. Glade Lloyd about wether or not  they have looked into getting her a new machine from a different company? I know that we have patients who have recently been diagnosed with sleep apnea and getting machines right away through [facility] [employer]. Aneta Mins would like a call back on Monday on his cell phone to let him know what the status of all of this is [phone].

## 2022-01-15 NOTE — Telephone Encounter (Signed)
Rhonda [name], can you please talk to him and let him know there is no option on the form to state patient is not using his CPAP machine since it's on recall. I just cannot state her sleep apnea does not affect her driving and patient is not using a CPAP machine. . I am waiting for guidance from the state.Can you make him understand this?

## 2022-01-18 NOTE — Telephone Encounter (Signed)
Called the [facility], they recommended to reach out to vender where C Pap was ordered. I called [facility], the item was ordered in September. They checked and everything is in order and they will process now.

## 2022-01-18 NOTE — Telephone Encounter (Signed)
Spoke with Rhonda [name] and explained the situation.   Rhonda [name] has an extension until 03/05/2022.Rhonda [name] spoke with Rhonda [name] today. A new CPAP order was sent to them in 07/30/2021 which they never processed. They said they will process the order now.

## 2022-01-19 NOTE — Telephone Encounter (Signed)
Noted. Thank you.

## 2022-01-19 NOTE — Telephone Encounter (Signed)
Pantoprazole  (Newest Message First)  [facility] (supporting Harrington Challenger, MD) Just now (3:16 PM)     BB  Last refill on 10/16/2021, for qty 90... I will soon be out.  Could you please authorize a refill and send it to [facility] in [place] for me>  Thanks.  P.S. I have and appt. with [facility] on Apr. 7th., looks like they may have a machine for me.  B.B

## 2022-01-20 MED ORDER — PANTOPRAZOLE SODIUM 40 MG PO TBEC
40 MG | ORAL_TABLET | Freq: Every day | ORAL | 3 refills | Status: AC
Start: 2022-01-20 — End: 2023-01-17

## 2022-01-20 NOTE — Telephone Encounter (Signed)
Medications Requested:  Requested Prescriptions     Pending Prescriptions Disp Refills    pantoprazole (PROTONIX) 40 MG tablet 90 tablet 3     Sig: Take 1 tablet by mouth daily       Preferred Pharmacy:   [facility] [address], [place] - [address] - P [phone] Carmon Ginsberg [phone]  [address]  [place] [address]  Phone: [phone] Fax: [phone]      Date of Last Refill: 10/26/2021    Prescription Refill Protocol reviewed: Yes    Allergy List Reviewed and Verified: Yes    Possible medication to medication interactions reviewed: Yes    Last appt @ PCP Office: 12/21/2021     Future Appointments   Date Time Provider Department Center   04/28/2022  8:40 AM Harrington Challenger, MD [facility]       MOST RECENT BLOOD PRESSURES  BP Readings from Last 3 Encounters:   12/21/21 (!) 142/74   12/11/21 (!) 168/82   10/08/21 (!) 157/94         MOST RECENT LAB DATA  Lab Results   Component Value Date/Time    K 4.2 08/27/2021 10:55 AM    ALT 23 08/27/2021 10:55 AM    CHOL 174 02/23/2021 07:20 AM    HGB 12.5 02/23/2021 07:20 AM    HCT 37.8 02/23/2021 07:20 AM

## 2022-01-28 IMAGING — MG MAMMO SCRN BIL W/CAD TOMO
8 series · 8 of 24 positions shown · non-contrast
Comparison: The present examination has been compared to prior imaging studies.

Images Obtained from [facility]
INDICATION: Screening.
TECHNIQUE: Bilateral 2-D digital screening mammogram was performed followed by 3-D tomosynthesis.  Current study was also evaluated with a computer aided detection (CAD) system.
MAMMOGRAM FINDINGS:
There are scattered areas of fibroglandular density.
No suspicious abnormality is seen in either breast.  There are no significant changes from the prior study.

[L CC]
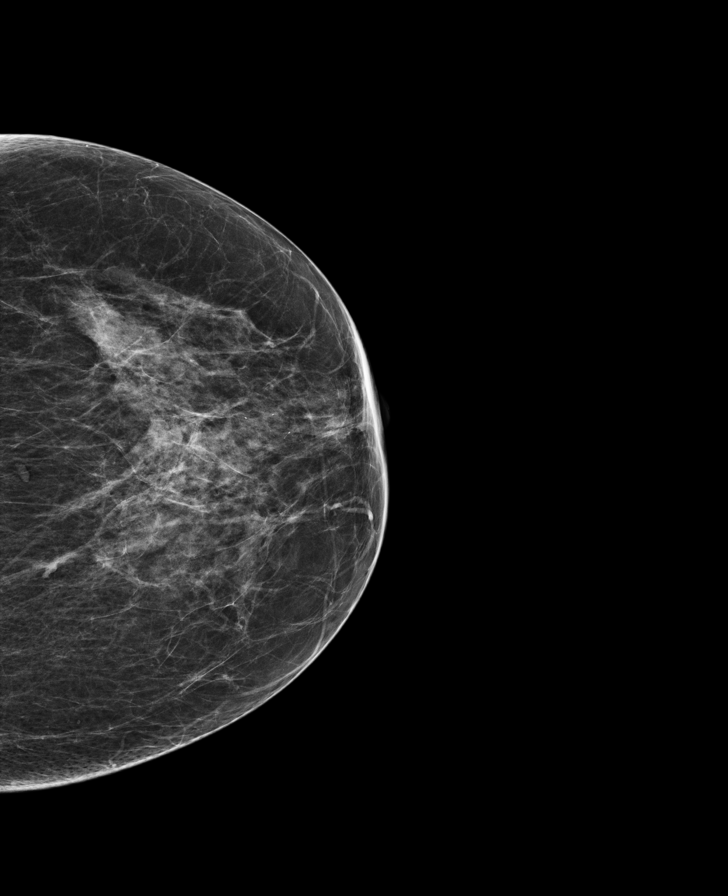

[R CC]
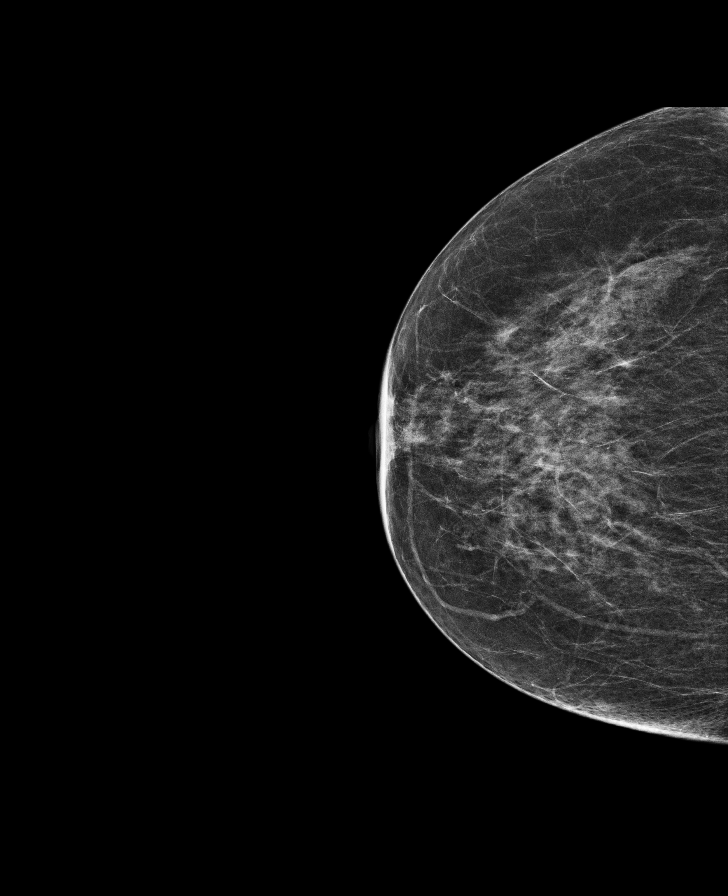

[L MLO]
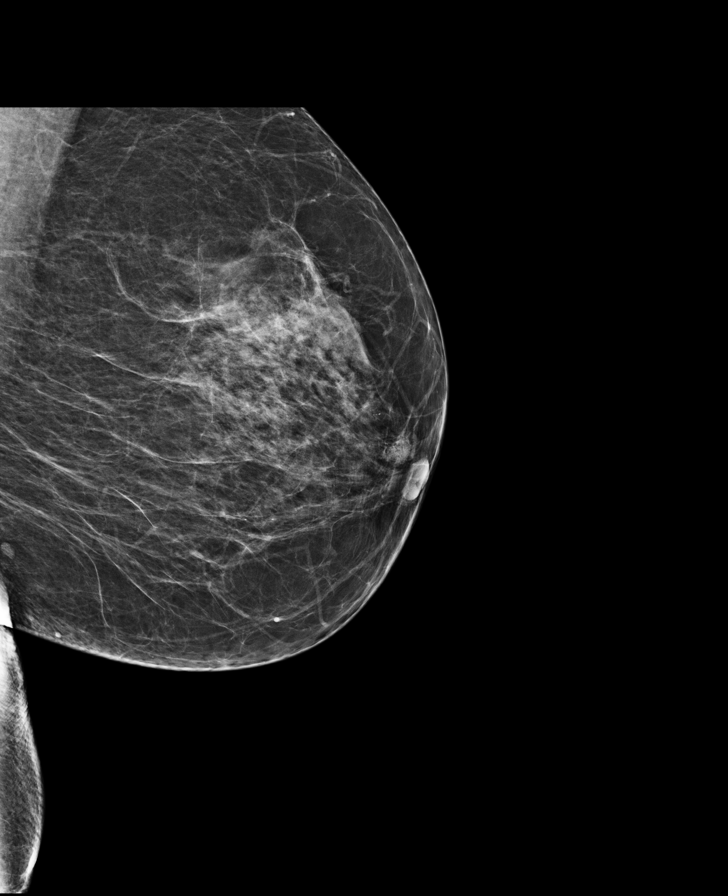

[R MLO]
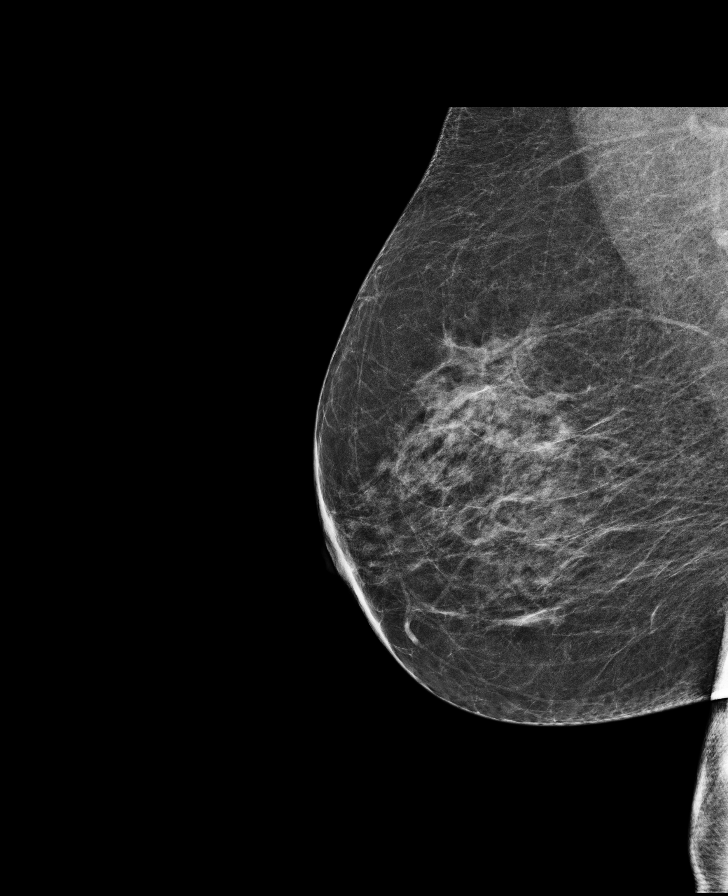

[L CC tomo · tomo slice 28/55.0]
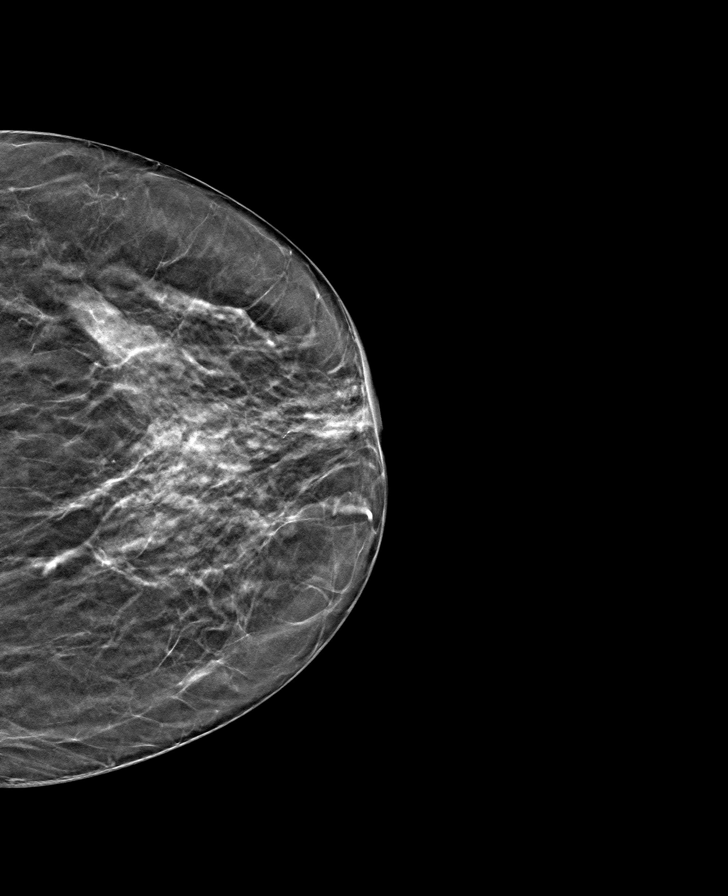

[R MLO tomo · tomo slice 31/61.0]
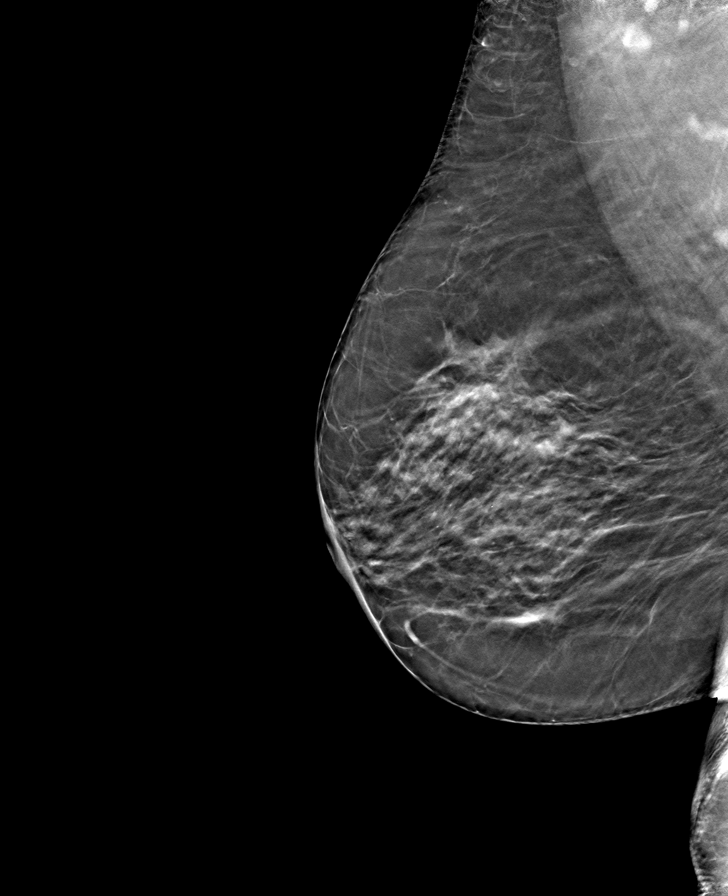

[R CC tomo · tomo slice 29/56.0]
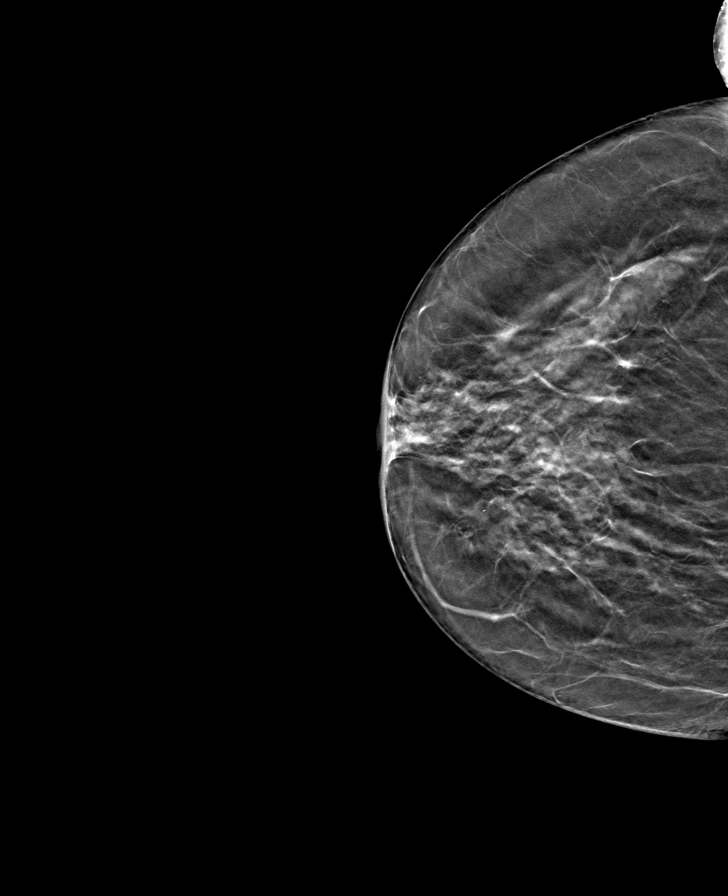

[L MLO tomo · tomo slice 31/60.0]
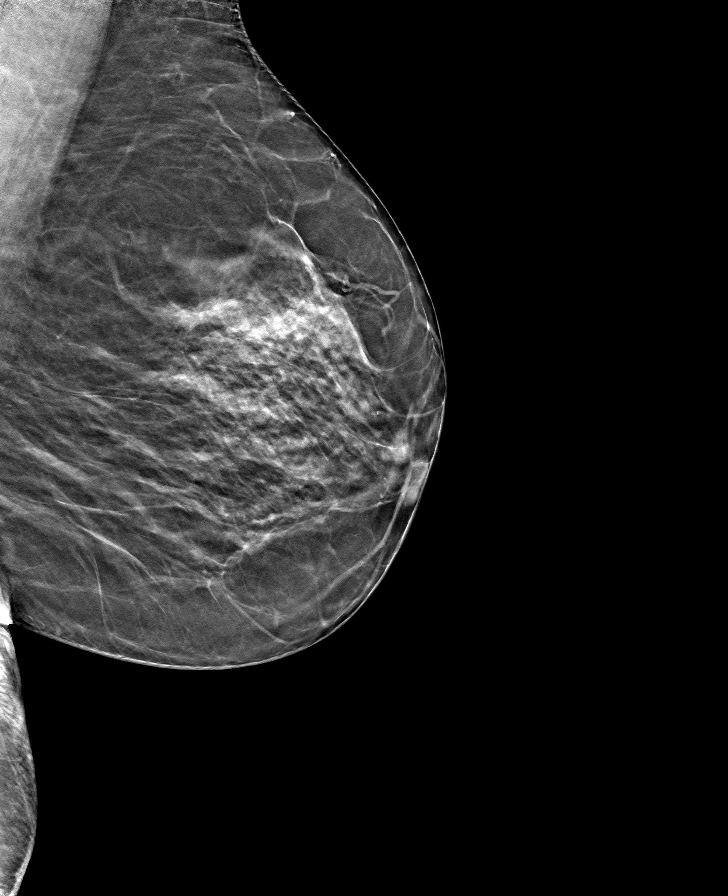

[8 of 24 positions shown; findings below may reference images not displayed]

IMPRESSION: There is no mammographic evidence of malignancy.
Screening mammogram recommended in 1 year.
BI-RADS Category 1: Negative

## 2022-02-08 NOTE — Telephone Encounter (Signed)
Can you ask Rhonda [name] if she received her CPAP machine?

## 2022-02-12 NOTE — Telephone Encounter (Signed)
Please review, if no action is needed sign encounter and close

## 2022-02-12 NOTE — Telephone Encounter (Signed)
Rhonda [name] - She confirms she received her CPAP machine and supplies today.    Supplier: [facility].

## 2022-02-15 NOTE — Telephone Encounter (Signed)
Form faxed scanned and mailed

## 2022-02-15 NOTE — Telephone Encounter (Signed)
Paper work signed and sent to state.

## 2022-02-16 MED ORDER — MELOXICAM 15 MG PO TABS
15 MG | ORAL_TABLET | Freq: Two times a day (BID) | ORAL | 3 refills | Status: DC
Start: 2022-02-16 — End: 2022-06-07

## 2022-02-16 NOTE — Telephone Encounter (Signed)
-----   Message from Lavone Orn, Lynch sent at 02/16/2022  7:49 AM EDT -----  Regarding: FW: CPAP machine  Contact: [phone]    ----- Message -----  From: Mariel Sleet, MA  Sent: 02/16/2022   7:05 AM EDT  To: [facility]  Subject: FW: CPAP machine                                   ----- Message -----  From: Annye Asa  Sent: 02/16/2022   6:10 AM EDT  To: [facility] Clinical Staff  Subject: CPAP machine                                     Yes, I got my machine on 4/7 and it is working well.  Someone called Fri. afternoon to verify and I told them I was just putting it together.  Used it last night for the first time...had trouble finding distilled water.  I didn't get this post until I checked in this morning to ask for a refill for meloxican 15mg .  I am out of refills.  Thanks.  

## 2022-02-16 NOTE — Telephone Encounter (Signed)
Meloxicam script sent.

## 2022-02-17 ENCOUNTER — Encounter (INDEPENDENT_AMBULATORY_CARE_PROVIDER_SITE_OTHER): Payer: Self-pay

## 2022-02-17 MED ORDER — LISINOPRIL 40 MG PO TABS
40 MG | ORAL_TABLET | Freq: Every day | ORAL | 1 refills | Status: AC
Start: 2022-02-17 — End: 2022-09-06

## 2022-02-17 NOTE — Telephone Encounter (Signed)
I replied to the patient via mychart. Form resent. Dr. Glade Lloyd completed based on new CPAP info

## 2022-02-17 NOTE — Telephone Encounter (Signed)
Reflled. Please confirm if she is taking 80mg  for a long time.

## 2022-02-17 NOTE — Telephone Encounter (Signed)
Forwarding request to provider for review of warning on RX

## 2022-02-17 NOTE — Telephone Encounter (Signed)
Replied via mychart. Info re new cpap sent 02/15/22

## 2022-02-17 NOTE — Telephone Encounter (Signed)
Medications Requested:  Requested Prescriptions     Pending Prescriptions Disp Refills    lisinopril (PRINIVIL;ZESTRIL) 40 MG tablet [Pharmacy Med Name: Lisinopril 40 MG Oral Tablet] 180 tablet 0     Sig: Take 2 tablets by mouth once daily       Preferred Pharmacy:   [facility] [address], [place] - [address] - P [phone] Carmon Ginsberg [phone]  [address]  [place] [address]  Phone: [phone] Fax: [phone]      Date of Last Refill: 10/08/2021 for 180tab with 3ref    Prescription Refill Protocol reviewed: Yes        Allergy List Reviewed and Verified: Yes    Possible medication to medication interactions reviewed: Yes    Last appt @ PCP Office: 12/21/2021     Future Appointments   Date Time Provider Department Center   04/28/2022  8:40 AM Harrington Challenger, MD [facility]       MOST RECENT BLOOD PRESSURES  BP Readings from Last 3 Encounters:   12/21/21 (!) 142/74   12/11/21 (!) 168/82   10/08/21 (!) 157/94         MOST RECENT LAB DATA  Lab Results   Component Value Date/Time    K 4.2 08/27/2021 10:55 AM    ALT 23 08/27/2021 10:55 AM    CHOL 174 02/23/2021 07:20 AM    HGB 12.5 02/23/2021 07:20 AM    HCT 37.8 02/23/2021 07:20 AM

## 2022-02-17 NOTE — Telephone Encounter (Signed)
-----   Message from [employer]. Rubens sent at 02/17/2022  8:20 AM EDT -----  Regarding: CPAP  Contact: [phone]  I have a new cpap machine and notified the state they told me they will need a "notification" from you and that you may need to see the simcard?  Please let me know what to do next.  Thanks [employer]

## 2022-02-18 NOTE — Telephone Encounter (Signed)
Yes, pt has been taking 2 right along to equal 80mg 

## 2022-02-23 ENCOUNTER — Other Ambulatory Visit: Payer: Self-pay

## 2022-02-23 MED ORDER — FLUTICASONE-SALMETEROL 250-50 MCG/ACT IN AEPB
250-50 MCG/ACT | Freq: Two times a day (BID) | RESPIRATORY_TRACT | 3 refills | Status: AC
Start: 2022-02-23 — End: 2023-01-19

## 2022-02-23 NOTE — Telephone Encounter (Signed)
Red warning comes up, so per new protocol unable to send. Please review and sign if appropriate.

## 2022-02-26 ENCOUNTER — Ambulatory Visit (INDEPENDENT_AMBULATORY_CARE_PROVIDER_SITE_OTHER)

## 2022-02-26 DIAGNOSIS — M533 Sacrococcygeal disorders, not elsewhere classified: Secondary | ICD-10-CM

## 2022-02-26 DIAGNOSIS — R1032 Left lower quadrant pain: Secondary | ICD-10-CM

## 2022-02-26 DIAGNOSIS — H6121 Impacted cerumen, right ear: Secondary | ICD-10-CM

## 2022-02-26 MED ORDER — TRIAMCINOLONE ACETONIDE 40 MG/ML IJ SUSP
40.0000 mg | Freq: Once | INTRAMUSCULAR | Status: AC
Start: 2022-02-26 — End: 2022-02-26
  Administered 2022-02-26: 40 mg via INTRAMUSCULAR

## 2022-02-26 NOTE — Progress Notes (Signed)
($)   Ear Cerumen Removal    Date/Time: 02/26/2022 2:17 PM    Performed by: Mercie Eon, PA  Authorized by: Mercie Eon, PA  Location details: right ear  Patient tolerance: patient tolerated the procedure well with no immediate complications  Procedure type: irrigation   Sedation:  Patient sedated: no

## 2022-02-26 NOTE — Assessment & Plan Note (Addendum)
Active   Suspecting muscular ideology since pain improves with ROM and stretching throughout the day  Not likely to be related to current SI joint pain   Tylenol/NSAIDS as needed   Consider physical therapy   X-ray ordered, follow up for results   Continue to monitor

## 2022-02-26 NOTE — Assessment & Plan Note (Signed)
Active  Ear lavage done in office, patient tolerated well  See proc doc   Avoid using Qtips   Continue to monitor

## 2022-02-26 NOTE — Patient Instructions (Signed)
magnesium glycinate 

## 2022-02-26 NOTE — Assessment & Plan Note (Addendum)
Active   IM injection given in office, patient tolerated well  Stretch as tolerated  Consider physical therapy   Continue to monitor

## 2022-02-26 NOTE — Progress Notes (Signed)
[place] - [place]-  [place] - [place] - [place]   [email or web address] and [email or web address]  Telephone: [phone]             Encounter Date:  02/26/22  2:06 PM   PCP: Adrienne Mocha  MRN: [id number]  DOB: December 29, 1945     CC: Ear Problem (76 y.o. f presents to clinic with right plugged ear since 10/2021) and Back Pain (Patient also experiencing lower back pain that is getting worse )       HPI:  Kathy Cherry is a 76 year old female presents     Chief Complaint   Patient presents with   . Ear Problem     76 y.o. f presents to clinic with right plugged ear since 10/2021   . Back Pain     Patient also experiencing lower back pain that is getting worse      Impacted cerumen   Patient has complaint of clogged right ear  She states that this has persisted since 10/2021   She attempted to clean her ear out with OTC methods and has not noticed improvement     Back pain  Patient has complaint of lower back pain, reports feeling pain going across her front side   She notes that the pain occurs for the first 40 minutes after getting up at the beginning of the day  Patient with perform stretching, which seems to help relief some of the pain.   Pain worsens when she walks or puts pressure on her leg - states pain is severe and stabbing.   She noticed that this began 12/2021 but admits she has always had lower back pain   She denies past surgeries       Other chronic conditions, if discussed, are documented below in the A/P    PROBLEM  LIST:  Patient Active Problem List   Diagnosis   . GERD (gastroesophageal reflux disease)   . IBS (irritable bowel syndrome)   . Chronic knee pain after total replacement of right knee joint   . Left shoulder pain   . Allergic rhinitis   . History of bilateral breast cancer   . Hyperlipidemia   . Blurred vision, bilateral   . Acute cystitis with hematuria   . Dysuria   . History of recurrent UTIs   . Family history of colon cancer requiring screening colonoscopy   . Atrophic  vaginitis   . History of peptic ulcer   . Urinary tract infection without hematuria, site unspecified   . Upper respiratory tract infection, unspecified type   . High cholesterol   . Insomnia, unspecified type   . Dry eye   . Need for hepatitis C screening test   . Annual physical exam   . Breast cancer screening   . Postmenopausal   . Encounter for routine adult health examination without abnormal findings   . Need for shingles vaccine   . Atherosclerosis of aorta (CMS-HCC)   . Presence of intraocular lens   . Puckering of macula, bilateral   . Presbyopia   . Macular puckering of retina   . Vitreous degeneration   . Tear film insufficiency   . Senile nuclear sclerosis   . Recurrent UTI   . Rash   . UTI symptoms   . Bilateral foot pain   . Bilateral hand pain   . Bilateral chronic knee pain   . Tingling of both feet   . Symptoms consistent with irritable bowel syndrome   .  Cough   . Postnasal drip   . Closed fracture of distal end of right humerus with routine healing   . Acute colitis   . Closed fracture of proximal end of right humerus, unspecified fracture morphology, sequela   . Preoperative examination   . Elevated LFTs   . Right shoulder pain, unspecified chronicity   . Need for vaccination   . Abdominal pain, unspecified abdominal location   . Sinusitis, unspecified chronicity, unspecified location   . Impacted cerumen of right ear   . Sacroiliac joint pain   . Left inguinal pain         PAST MEDICAL HISTORY:  Past Medical History:   Diagnosis Date   . Colitis     x 2 09/24/2017 and 10/28/2017       PAST SURGICAL HISTORY:  Past Surgical History:   Procedure Laterality Date   . SHOULDER OPEN ROTATOR CUFF REPAIR Left 2015   . TOTAL KNEE ARTHROPLASTY  2013   . APPENDECTOMY      76 y/o   . Bleeding Ulcers      76 y/o   . BREAST LUMPECTOMY Left     x 2    . CATARACT EXTRACTION     . LYMPHADENECTOMY Left         FAMILY HISTORY:   Family History   Problem Relation Name Age of Onset   . Breast Cancer Mother     .  Diabetes Mother     . Hypertension Mother     . Obesity Mother     . Colon Cancer Father     . Diabetes Father     . Hypertension Father     . Obesity Father     . Breast Cancer Maternal Grandmother     . Breast Cancer Paternal Grandmother           SOCIAL HISTORY:  Social History     Socioeconomic History   . Marital status: Single   Tobacco Use   . Smoking status: Former     Types: Cigarettes     Quit date: 2006     Years since quitting: 17.3   . Smokeless tobacco: Never   Substance and Sexual Activity   . Alcohol use: Yes     Alcohol/week: 8.3 standard drinks of alcohol     Types: 1 Glasses of wine per week     Comment: rare   . Drug use: No     Social History     Tobacco Use   Smoking Status Former   . Types: Cigarettes   . Quit date: 2006   . Years since quitting: 17.3   Smokeless Tobacco Never   Vaping Use   Vaping Status Not on file      Social History     Substance and Sexual Activity   Alcohol Use Yes   . Alcohol/week: 8.3 standard drinks of alcohol   . Types: 1 Glasses of wine per week    Comment: rare      Social History     Substance and Sexual Activity   Drug Use No        Immunization History   Administered Date(s) Administered   . COVID-19 (Moderna) Low Dose Red Cap >= 18 Years 08/10/2021   . COVID-19 (Moderna) Red Cap >= 12 Years 11/22/2019, 12/26/2019   . COVID-19 [employer]) Purple Cap >= 12 Years 09/24/2020   . H1N1 Vaccine 09/21/2008   . Influenza Vaccine (High Dose)  Quadrivalent >=65 Years 09/02/2018, 09/02/2018, 08/10/2021   . Influenza Vaccine >=6 Months 09/01/2020   . Pneumococcal 20 Vaccine (PREVNAR-20) 11/30/2021   . Tdap 05/01/2019         CURRENT  MEDICATIONS:  Current Outpatient Medications on File Prior to Visit   Medication Sig Dispense Refill   . albuterol 108 (90 Base) MCG/ACT inhaler Inhale 2 puffs by mouth every 6 hours as needed for Wheezing. 1 each 0   . azithromycin (ZITHROMAX) 250 MG tablet Take 1 tablet (250 mg) by mouth As Directed. Take 2 tablets by mouth on day 1, then take 1  tablet daily on days 2-5. 6 tablet 0   . CRANBERRY PO      . diphenoxylate-atropine (LOMOTIL) 2.5-0.025 MG tablet TAKE 1 TABLET BY MOUTH 4 TIMES A DAY AS NEEDED FOR DIARRHEA 90 tablet 0   . simvastatin (ZOCOR) 10 MG tablet Take 1 tablet (10 mg) by mouth every evening. 90 tablet 3   . traZODone (DESYREL) 50 MG tablet TAKE 1 TABLET BY MOUTH EVERY DAY AT NIGHT 90 tablet 2     No current facility-administered medications on file prior to visit.     Outpatient Medications Marked as Taking for the 02/26/22 encounter (Office Visit) with Mercie Eon, PA   Medication Sig Dispense Refill   . azithromycin (ZITHROMAX) 250 MG tablet Take 1 tablet (250 mg) by mouth As Directed. Take 2 tablets by mouth on day 1, then take 1 tablet daily on days 2-5. 6 tablet 0   . CRANBERRY PO      . diphenoxylate-atropine (LOMOTIL) 2.5-0.025 MG tablet TAKE 1 TABLET BY MOUTH 4 TIMES A DAY AS NEEDED FOR DIARRHEA 90 tablet 0   . simvastatin (ZOCOR) 10 MG tablet Take 1 tablet (10 mg) by mouth every evening. 90 tablet 3   . traZODone (DESYREL) 50 MG tablet TAKE 1 TABLET BY MOUTH EVERY DAY AT NIGHT 90 tablet 2     Current Facility-Administered Medications for the 02/26/22 encounter (Office Visit) with Mercie Eon, PA   Medication Dose Route Frequency Provider Last Rate Last Admin   . [COMPLETED] triamcinolone acetonide (KENALOG-40) 40 MG/ML injection 40 mg  40 mg IntraMUSCULAR Once Jermey Closs, PA   40 mg at 02/26/22 0230        ALLERGIES:    Allergies   Allergen Reactions   . Valium [Diazepam] Other     Per pt gets hyper sensitive          REVIEW OF SYSTEMS:  Review of Systems  Negative except as noted in HPI         PHYSICAL EXAM:   02/26/22  1404   BP: 142/80   Pulse: 98   Resp: 16   Temp: 97.5 F (36.4 C)     Body mass index is 29.23 kg/m.      Ht Readings from Last 1 Encounters:   02/26/22  (1.575 m)     Wt Readings from Last 1 Encounters:   02/26/22 72.5 kg (159 lb 12.8 oz)     Date Weight Recorded 02/26/2022 11/30/2021  04/16/2021 02/18/2021 12/02/2020 10/29/2020   Metric 72.485 kg 73.029 kg 65.772 kg 71.578 kg 65.772 kg 68.04 kg   Pounds/Ounces 159 lb 12.8 oz 161 lb 145 lb 157 lb 12.8 oz 145 lb 150 lb            Physical Exam  Vitals and nursing note reviewed.   Constitutional:       General: She  is not in acute distress.     Appearance: Normal appearance. She is well-developed. She is not ill-appearing.   HENT:      Right Ear: There is impacted cerumen.      Left Ear: There is no impacted cerumen.   Cardiovascular:      Rate and Rhythm: Normal rate and regular rhythm.      Pulses: Normal pulses.      Heart sounds: Normal heart sounds. No murmur heard.     No friction rub. No gallop.   Pulmonary:      Effort: Pulmonary effort is normal. No respiratory distress.      Breath sounds: Normal breath sounds. No stridor. No wheezing, rhonchi or rales.   Musculoskeletal:      Lumbar back: No swelling, edema, deformity, signs of trauma, spasms, tenderness or bony tenderness. Normal range of motion. Negative right straight leg raise test and negative left straight leg raise test.      Comments: Left SI joint TTP.  Negative straight leg and reverse straight leg test  Negative FABER test   Normal flexion/extension of the hip  Normal external/internal rotation of the hip    Skin:     General: Skin is warm and dry.   Neurological:      Mental Status: She is alert and oriented to person, place, and time.   Psychiatric:         Mood and Affect: Mood normal.         Behavior: Behavior normal.         Thought Content: Thought content normal.         Judgment: Judgment normal.                  ASSESSMENT & PLAN:    Kathy Cherry was seen today for ear problem and back pain.    Diagnoses and all orders for this visit:    Impacted cerumen of right ear  Assessment & Plan:  Active  Ear lavage done in office, patient tolerated well  See proc doc   Avoid using Qtips   Continue to monitor       Orders:  -     ($) Ear Cerumen Removal    Sacroiliac joint  pain  Assessment & Plan:  Active   IM injection given in office, patient tolerated well  Stretch as tolerated  Consider physical therapy   Consider magnesium glycinate   Continue to monitor     Orders:  -     X-Ray Lumbosacral Spine 2 Or 3 Views  -     triamcinolone acetonide (KENALOG-40) 40 MG/ML injection 40 mg    Left inguinal pain  Assessment & Plan:  Active   Suspecting muscular ideology since pain improves with ROM throughout the day  Not likely to be related to SI joint pain   Tylenol/NSAIDS as needed   Consider physical therapy   X-ray ordered, follow up for results   Continue to monitor     Orders:  -     X-Ray Lumbosacral Spine 2 Or 3 Views        ICD-10-CM ICD-9-CM    1. Impacted cerumen of right ear  H61.21 380.4 ($) Ear Cerumen Removal      2. Sacroiliac joint pain  M53.3 724.6 X-Ray Lumbosacral Spine 2 Or 3 Views      triamcinolone acetonide (KENALOG-40) 40 MG/ML injection 40 mg      3. Left inguinal pain  R10.32 789.04  X-Ray Lumbosacral Spine 2 Or 3 Views            By signing below, I acknowledge that I have reviewed the above note, dictated by me and scribed by Lidia CollumLayla Ayson, for accuracy and edited  where necessary. The note accurately reflects the services provided at this  encounter. We retain the right to modify this information in the event of  errors. Occasional errors in punctuation, grammar and content may occur.        Electronically reviewed and signed by Mercie Eonharles Tylesha Gibeault, PA-C.       [facility]  [email or web address]

## 2022-03-09 ENCOUNTER — Encounter (INDEPENDENT_AMBULATORY_CARE_PROVIDER_SITE_OTHER): Payer: Self-pay | Admitting: Family Medicine

## 2022-03-18 ENCOUNTER — Telehealth (INDEPENDENT_AMBULATORY_CARE_PROVIDER_SITE_OTHER): Payer: Self-pay

## 2022-03-18 DIAGNOSIS — M533 Sacrococcygeal disorders, not elsewhere classified: Secondary | ICD-10-CM

## 2022-03-18 NOTE — Telephone Encounter (Signed)
Please let pt know her XR lumbosacral spine showed: moderate osteoarthritis.    Osteoarthritis, or degenerative joint disease is a condition that affects the joints, causing "wear-and-tear" over time. It is a common condition that can happen to anyone as they age.    In this condition, the cartilage that cushions the joints wears away, causing the bones to rub against each other. This can lead to pain, stiffness, and limited mobility in the affected joints. While it cannot be cured, the symptoms of degenerative joint disease can be managed with medication, physical therapy, and lifestyle changes. It is important to maintain a healthy weight and exercise regularly to reduce the strain on the joints.    Please do not hesitate to reach out if you have any questions or concerns about these findings.

## 2022-03-24 MED ORDER — NAPROXEN 250 MG OR TABS
250.0000 mg | ORAL_TABLET | Freq: Every day | ORAL | 0 refills | Status: DC | PRN
Start: 2022-03-24 — End: 2022-03-30

## 2022-03-24 NOTE — Addendum Note (Signed)
Addended by: Arrie Aran on: 03/24/2022 06:24 PM     Modules accepted: Orders

## 2022-03-24 NOTE — Telephone Encounter (Signed)
Patient has been notified and asked if a pain medication can be sent to her pharmacy? Please advise

## 2022-03-24 NOTE — Telephone Encounter (Signed)
Will send Naproxen to use AS NEEDED since pt should refrain from Tylenol use (recent elevated liver enzymes - pt should have scheduled with GI by now).

## 2022-03-26 IMAGING — US DOP ARTERIAL LWR EXT BIL
1 series · 13 of 16 positions shown · non-contrast
Comparison: None.

HISTORY: 76-year-old female with pain in both legs.
TECHNIQUE: Multiple gray-scale and color-flow images of the bilateral lower extremity arteries were obtained with ultrasound.  Spectral Doppler analysis was also performed.

[Series 2: dop arterial lwr ext bil · arterial · 13 of 66 slices shown]
[im 1/66]
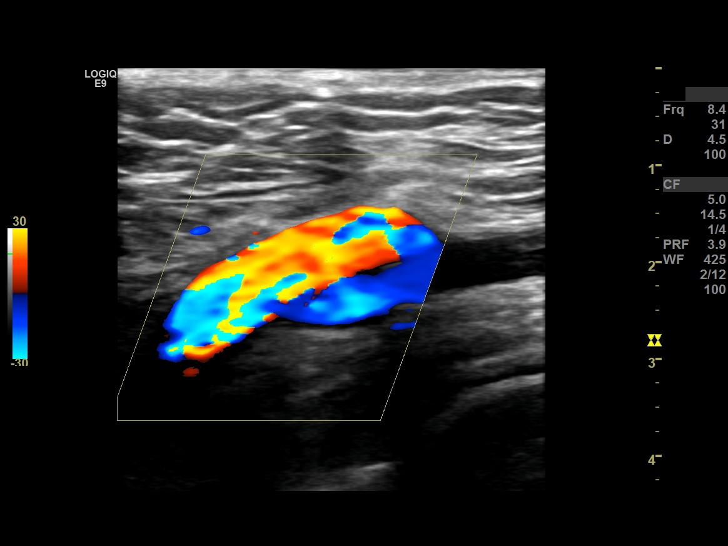
[im 5/66]
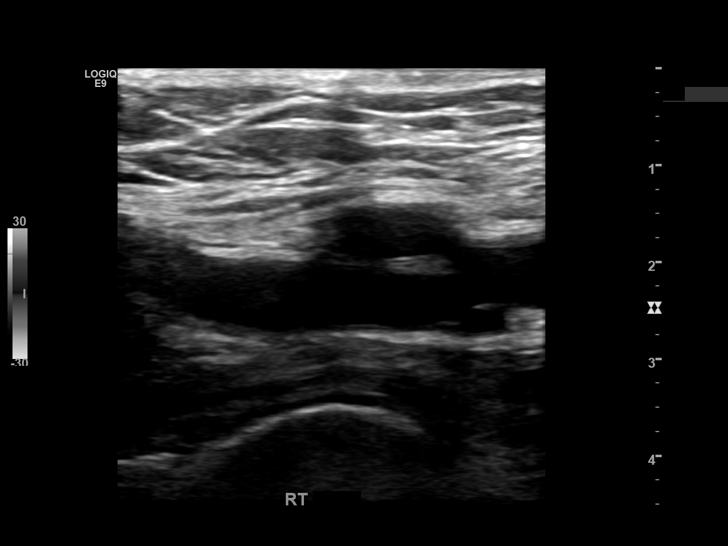
[im 14/66]
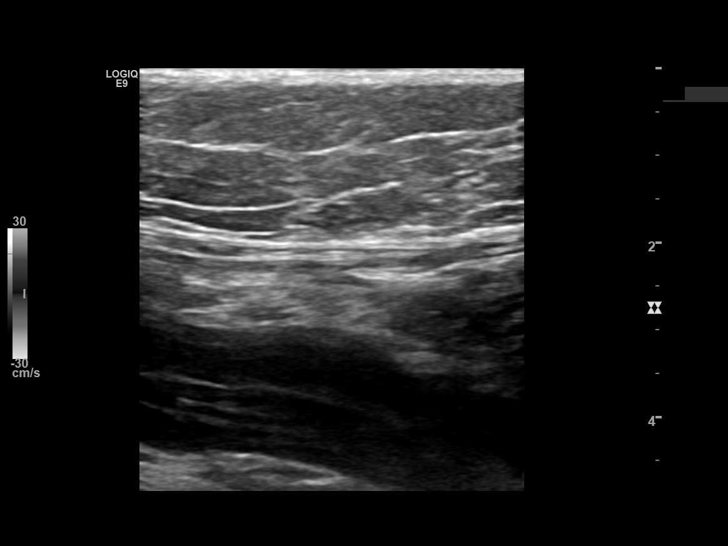
[im 18/66]
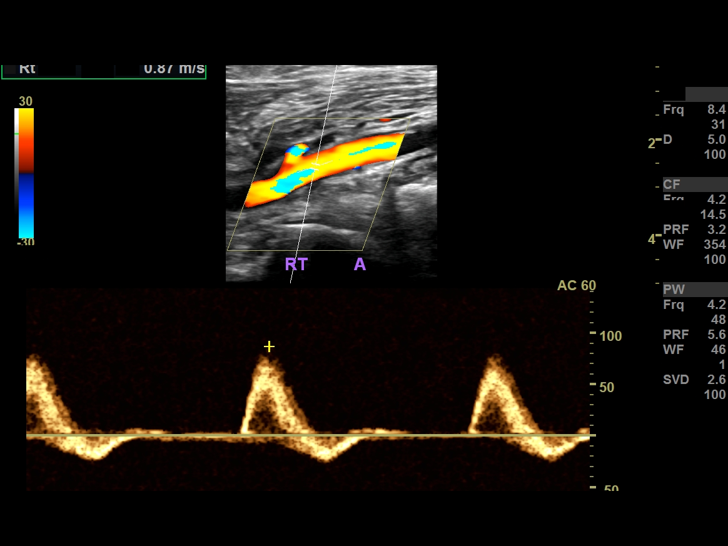
[im 22/66]
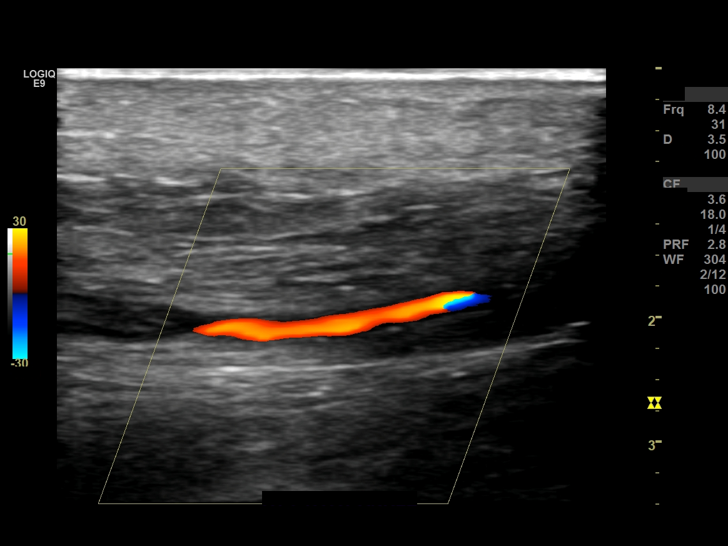
[im 27/66]
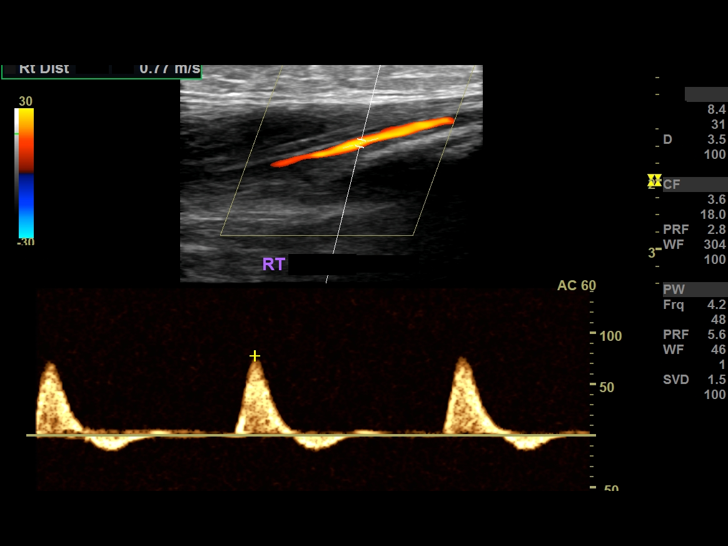
[im 35/66]
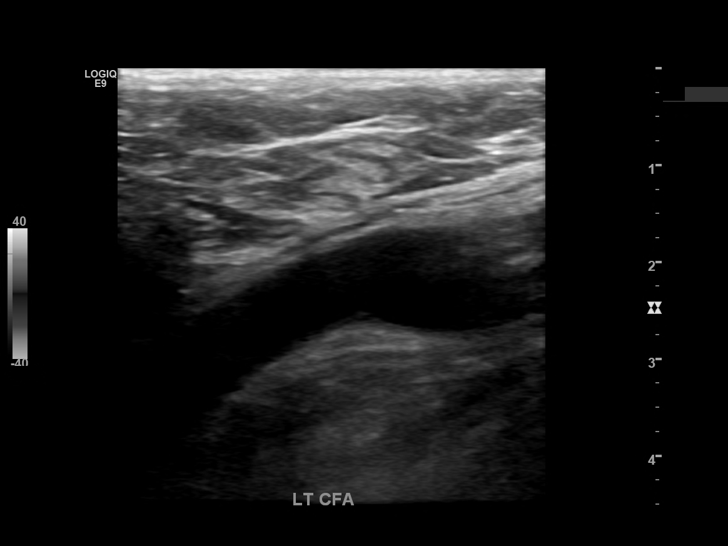
[im 40/66]
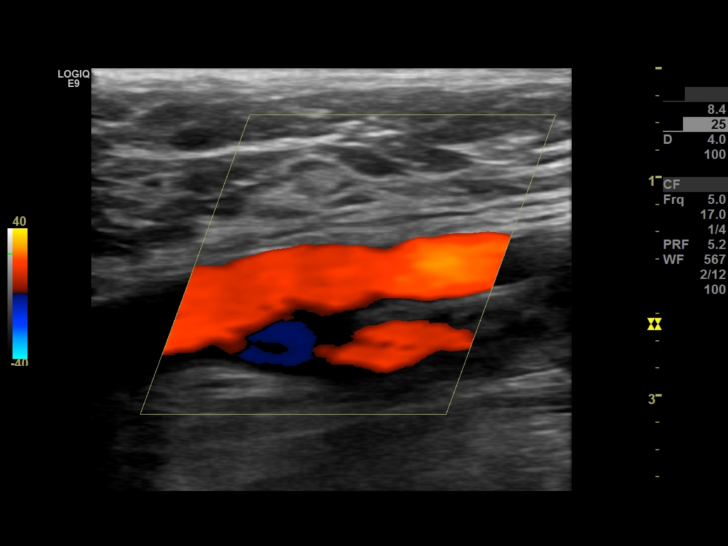
[im 44/66]
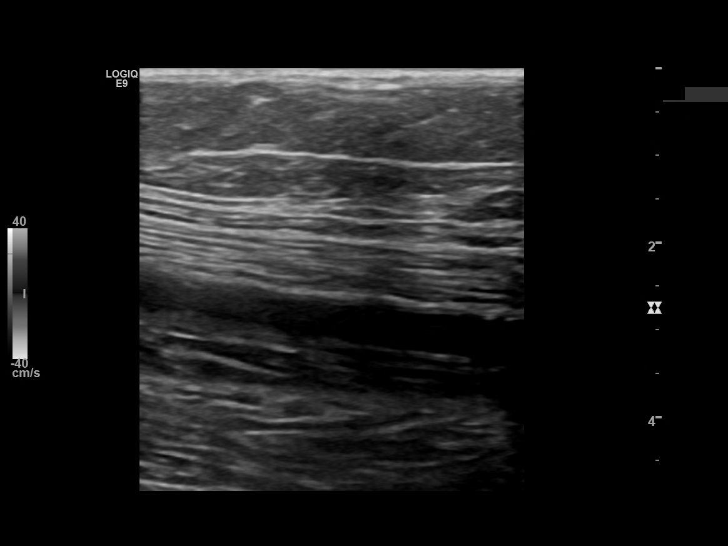
[im 48/66]
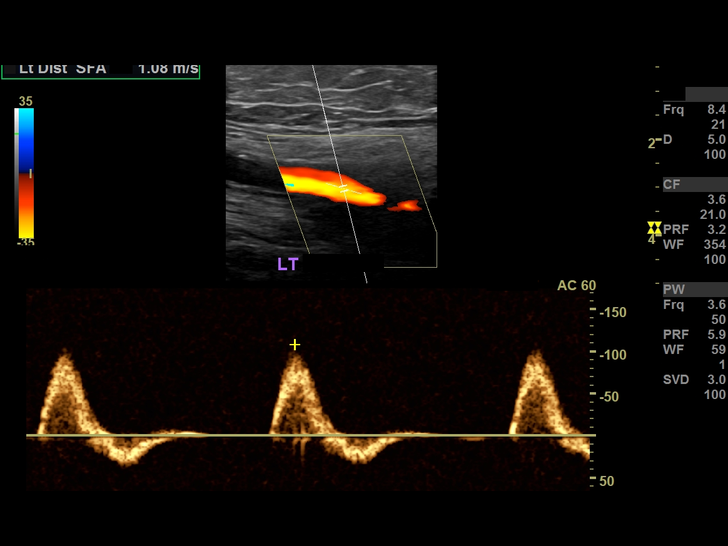
[im 53/66]
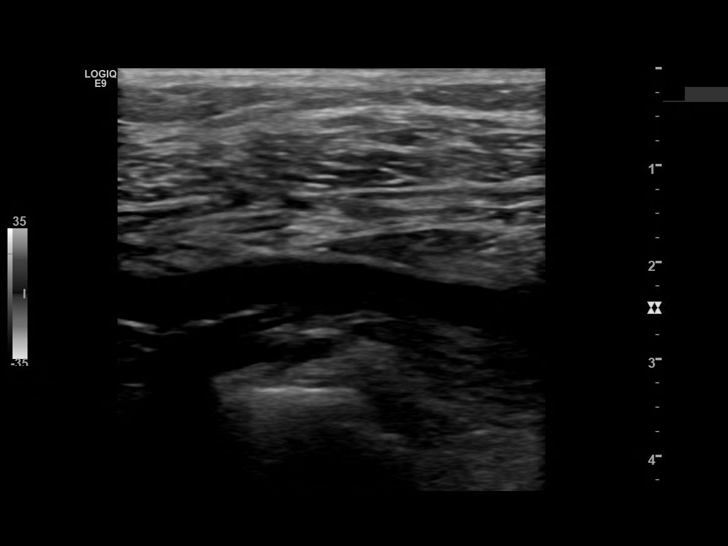
[im 61/66]
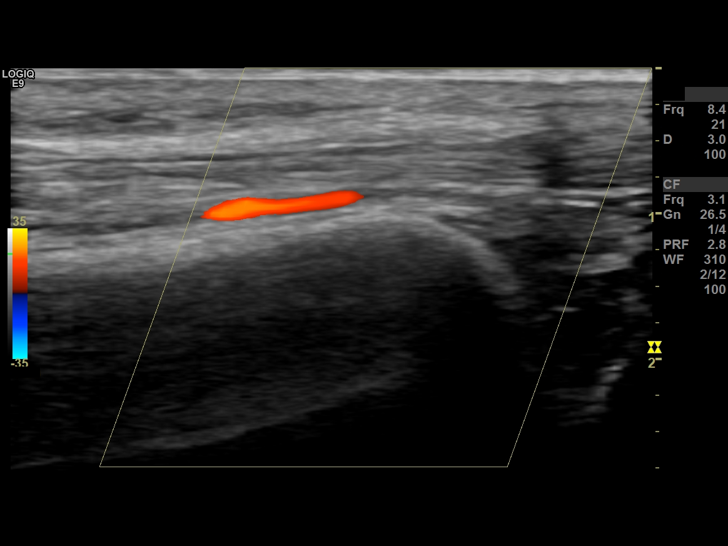
[im 66/66]
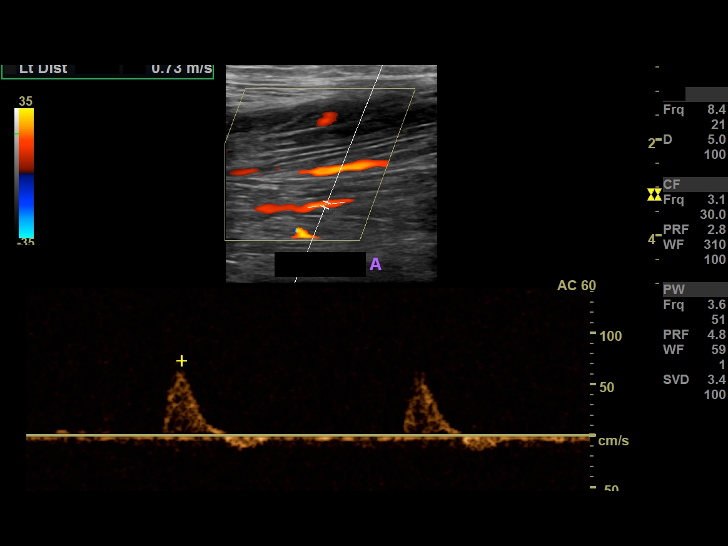

[13 of 16 positions shown; findings below may reference images not displayed]

FINDINGS: The right leg arterial system demonstrates the following waveforms and peak systolic velocities (m/s):

Common femoral artery: TRIPHASIC,

Profunda femoral artery: BIPHASIC,

Superficial femoral artery proximal: TRIPHASIC,

Superficial femoral artery mid: TRIPHASIC,

Superficial femoral artery distal: TRIPHASIC,

Popliteal artery proximal: TRIPHASIC,

Popliteal artery distal: TRIPHASIC,

Anterior tibial artery: TRIPHASIC,

Posterior tibial artery: BIPHASIC,

Peroneal artery: BIPHASIC,

Dorsalis pedis artery: BIPHASIC,

The left leg arterial system demonstrates the following waveforms and peak systolic velocities (m/s):

Common femoral artery: TRIPHASIC,

Profunda femoral artery: TRIPHASIC,

Superficial femoral artery proximal: TRIPHASIC,

Superficial femoral artery mid: TRIPHASIC,

Superficial femoral artery distal: TRIPHASIC,

Popliteal artery proximal: TRIPHASIC,

Popliteal artery distal: TRIPHASIC,

Anterior tibial artery: BIPHASIC.

Posterior tibial artery: TRIPHASIC,

Peroneal artery: BIPHASIC,

Dorsalis pedis artery: BIPHASIC,

Cardiac rhythm: REGULAR.
IMPRESSION: 1. Hi flow velocity within the common femoral arteries bilaterally, more so on the right. Finding may relate to more proximal aortoiliac disease not visible on this study. Consider further evaluation MRA bilateral lower extremities.

2. Mild atherosclerotic irregularity remainder of the legs. No convincing evidence of high-grade focal stenosis. Predominantly triphasic waveforms above the level of the knee and predominantly biphasic waveforms below the level of the knee.

## 2022-03-26 IMAGING — US DOP VENOUS LWR EXT BIL
1 series · 15 of 24 positions shown · non-contrast
Comparison: None.

HISTORY: Pain in both legs.
TECHNIQUE: Bilateral  lower extremity Doppler venous ultrasound was performed with real-time, gray-scale, color-flow Doppler and spectral analysis

[Series 1: dop venous lwr ext bil · portal-venous · 15 of 38 slices shown]
[im 1/38]
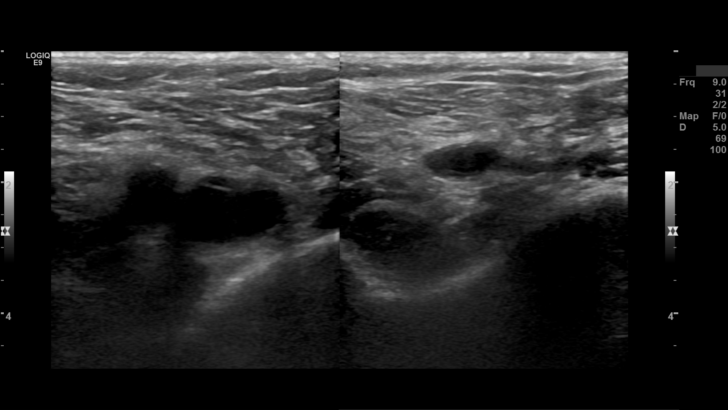
[im 4/38]
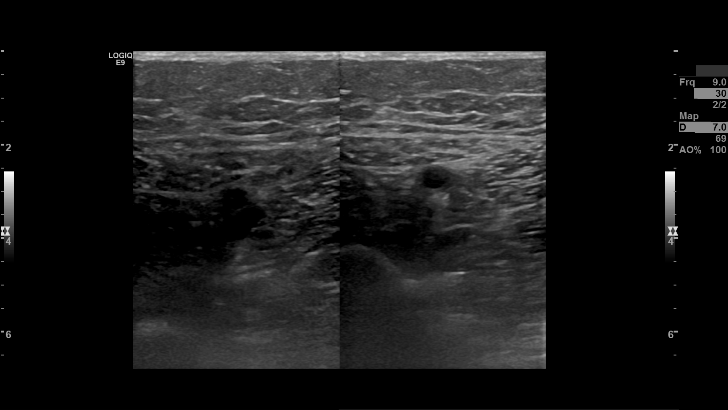
[im 7/38]
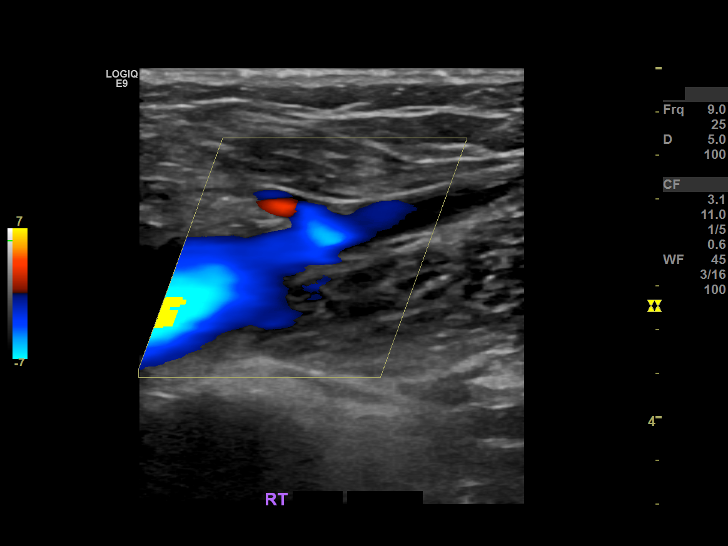
[im 9/38]
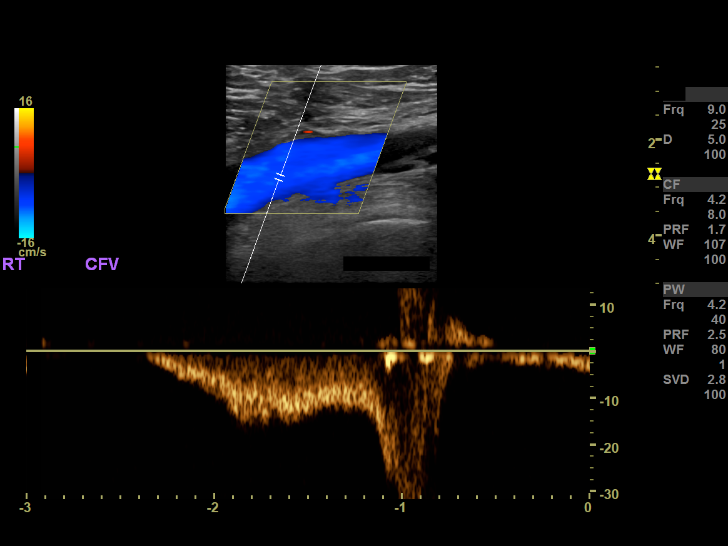
[im 12/38]
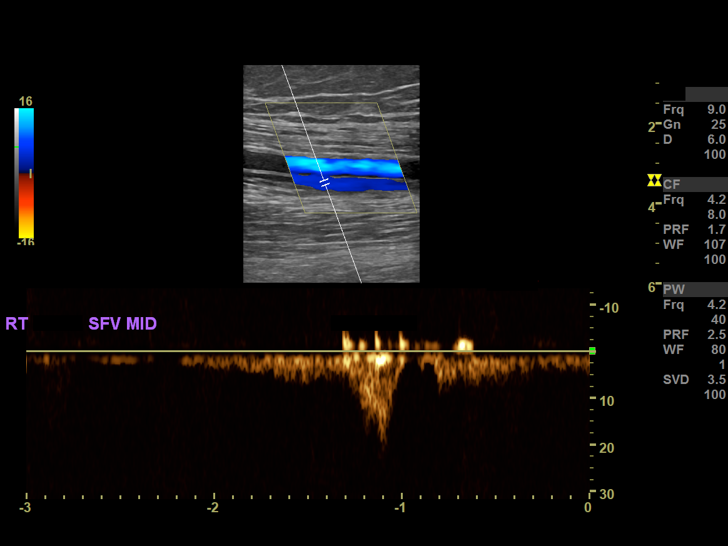
[im 13/38]
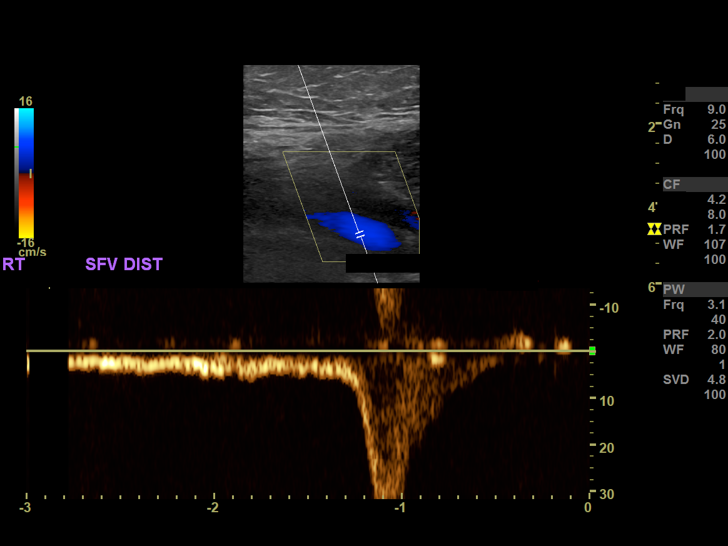
[im 17/38]
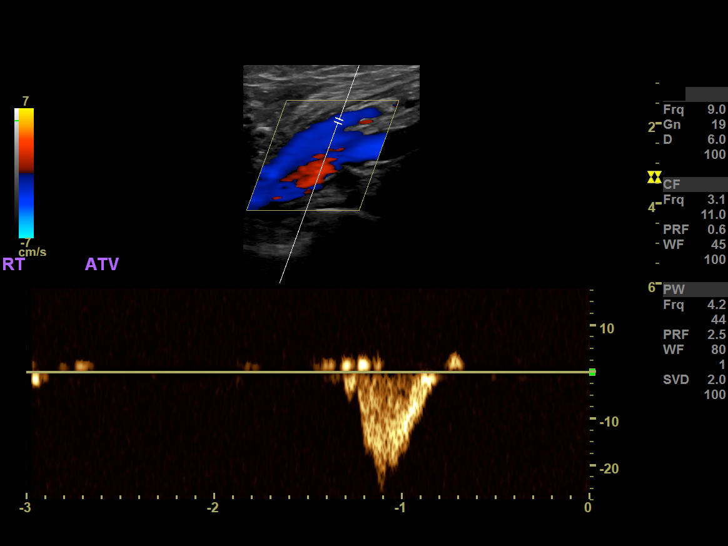
[im 20/38]
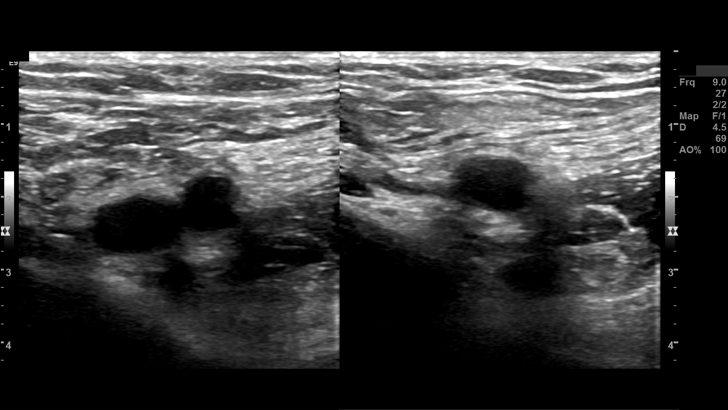
[im 21/38]
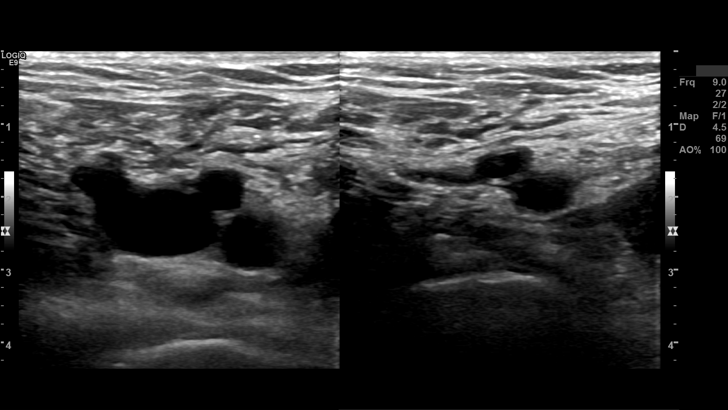
[im 25/38]
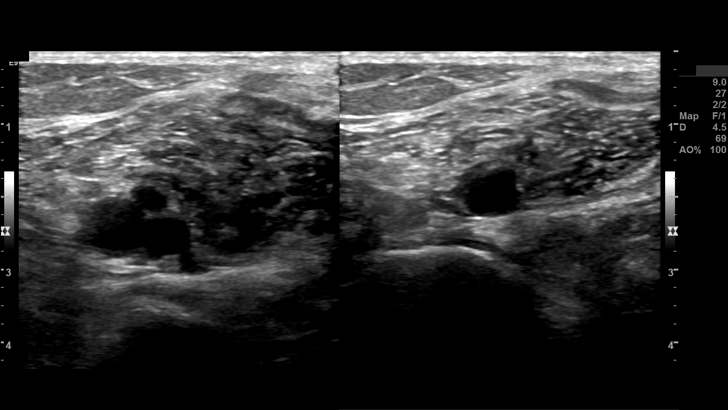
[im 26/38]
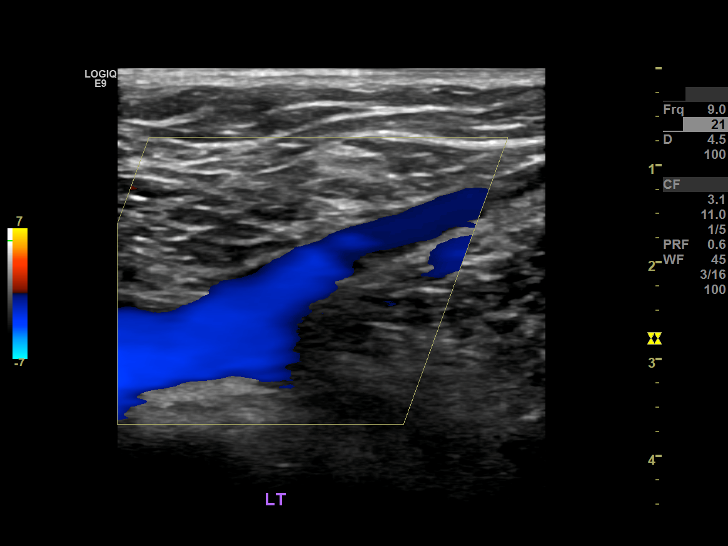
[im 29/38]
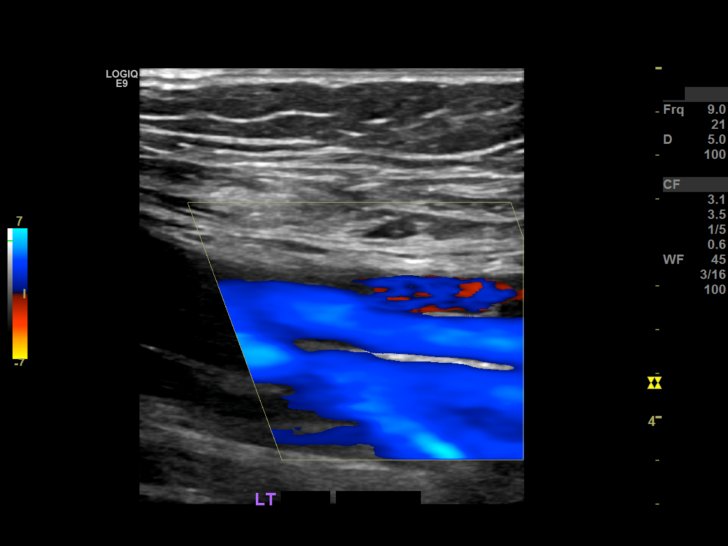
[im 33/38]
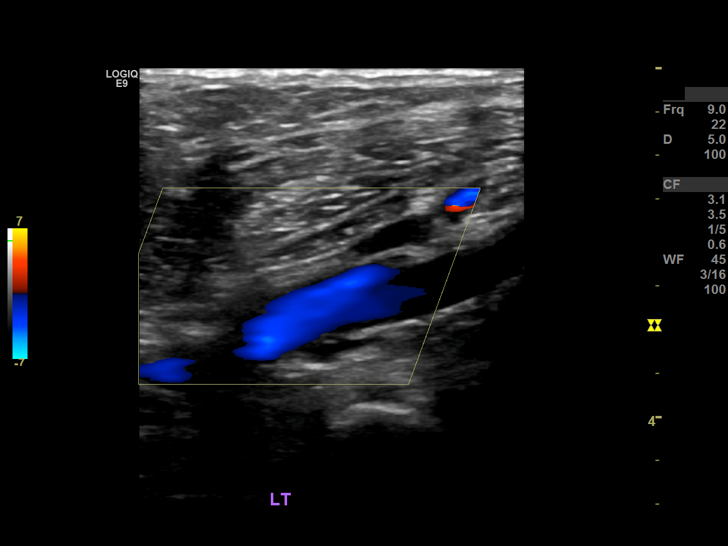
[im 34/38]
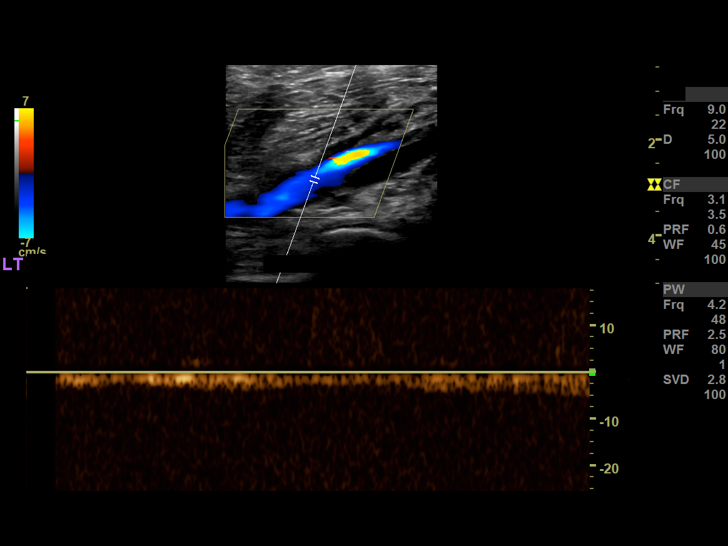
[im 38/38]
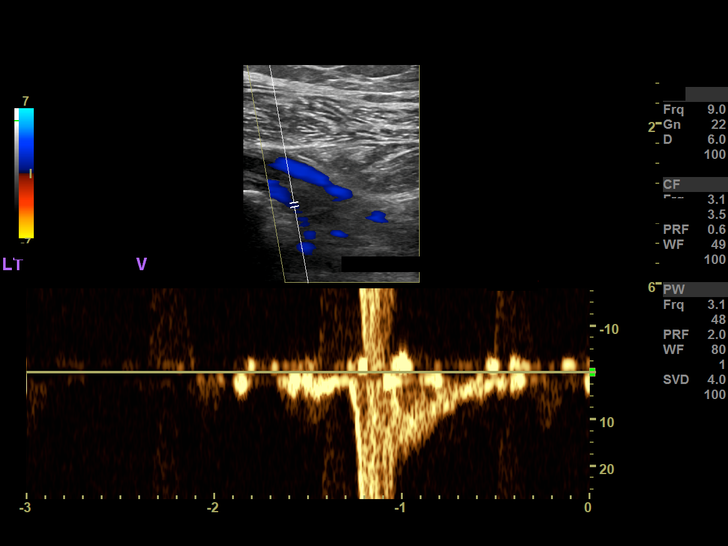

[15 of 24 positions shown; findings below may reference images not displayed]

FINDINGS: The veins of the bilateral lower extremities were interrogated including the common femoral, superficial femoral, popliteal and saphenous veins.  Flow is present in these vessels.  They demonstrate normal compressibility and augmentation of flow with appropriate maneuvers. Normal respiratory variation of flow is noted.
IMPRESSION: No evidence of deep venous thrombosis of the bilateral lower extremities.

## 2022-03-29 ENCOUNTER — Other Ambulatory Visit (INDEPENDENT_AMBULATORY_CARE_PROVIDER_SITE_OTHER): Payer: Self-pay

## 2022-03-29 ENCOUNTER — Other Ambulatory Visit (INDEPENDENT_AMBULATORY_CARE_PROVIDER_SITE_OTHER): Payer: Self-pay | Admitting: Family Medicine

## 2022-03-29 DIAGNOSIS — M533 Sacrococcygeal disorders, not elsewhere classified: Secondary | ICD-10-CM

## 2022-03-29 DIAGNOSIS — K589 Irritable bowel syndrome without diarrhea: Secondary | ICD-10-CM

## 2022-03-29 MED ORDER — DIPHENOXYLATE-ATROPINE 2.5-0.025 MG OR TABS
ORAL_TABLET | ORAL | 0 refills | Status: AC
Start: 2022-03-29 — End: ?

## 2022-03-30 MED ORDER — NAPROXEN 250 MG OR TABS
250.00 mg | ORAL_TABLET | Freq: Every day | ORAL | 0 refills | Status: AC | PRN
Start: 2022-03-30 — End: ?

## 2022-04-14 IMAGING — MR MRA LWR BIL EXT WO/W CONTRAST
7 of 8 series · 34 of 40 positions shown · IV contrast (30CC PROHANCE)
Comparison: Bilateral leg arterial Doppler ultrasound study 03/26/2022.

Images Obtained from [facility]
HISTORY: 76-year-old female with peripheral vascular disease, unspecified, varicose veins of bilateral lower extremities with pain.
TECHNIQUE: Time-resolved MR angiography of the lower legs is performed first without contrast followed by 10 cc of intravenous ProHance. Then 3D contrast-enhanced MR angiogram of the abdominal aorta,
pelvis and bilateral lower extremities is performed using an additional 30 cc of IV ProHance. 3D reconstructions including coronal and sagittal images and 3D reconstruction using MIP and volume
rendering algorithms are performed. The serum creatinine draw is 1.0 mg/dL, adequate for intravenous contrast. Post-processing software generated rotating 3-D and MIP images also performed.

[Series 7: time_resolve_fl3d_cor_+c_sub_mip_cor · coronal · 66.0mm · 1.33mm/px · 1 of 5 slices shown]
[im 1/5]
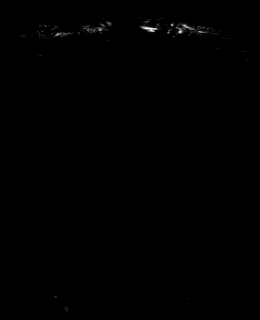

[Series 19: time_resolve_fl3d_cor_+c_sub · coronal · 1.5mm · 1.33mm/px · 3 of 38 slices shown]
[im 1/38]
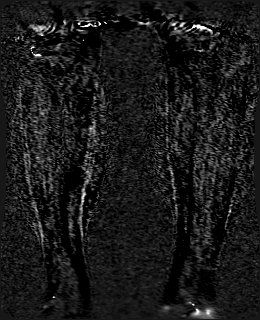
[im 19/38]
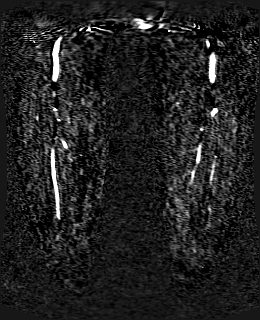
[im 38/38]
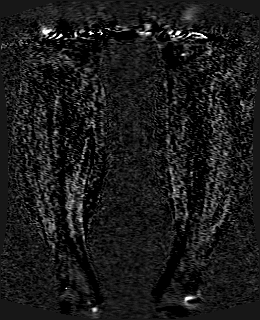

[Series 60: fl3d_cor_+c_pelvis_tt=1.0s_sub · coronal · 1.5mm · 1.00mm/px · 8 of 71 slices shown]
[im 1/71]
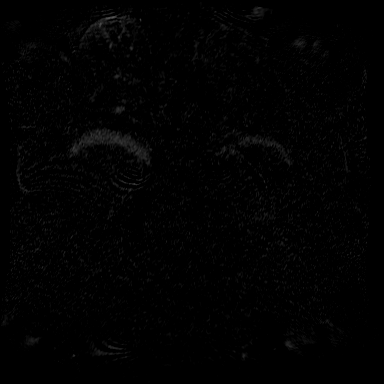
[im 11/71]
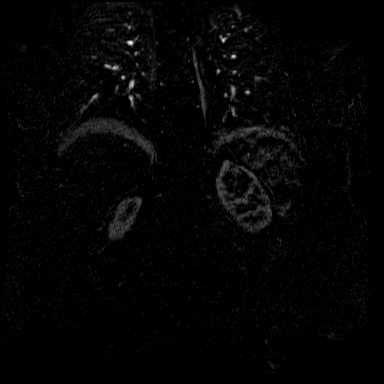
[im 21/71]
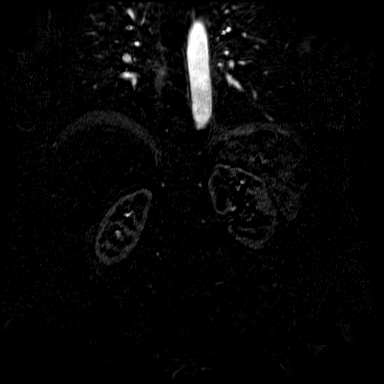
[im 31/71]
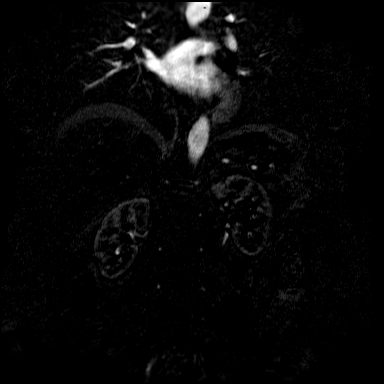
[im 41/71]
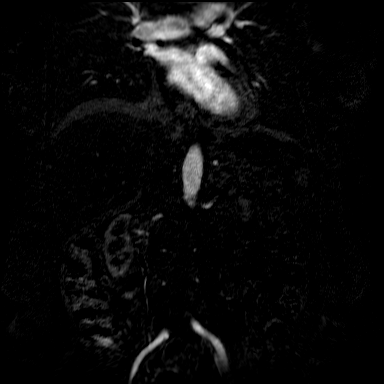
[im 51/71]
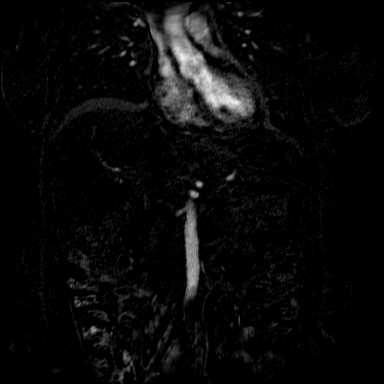
[im 61/71]
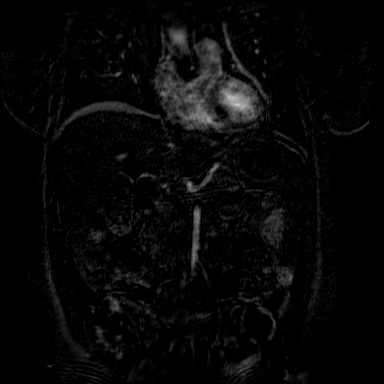
[im 71/71]
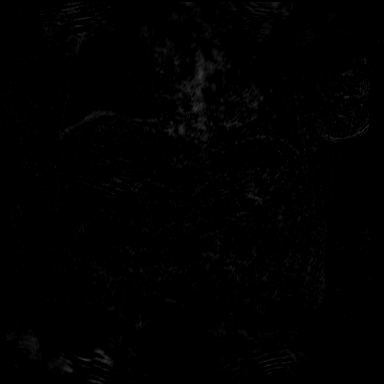

[Series 64: fl3d_cor_+c_upr_legs_sub · coronal · 1.5mm · 1.00mm/px · 7 of 64 slices shown]
[im 1/64]
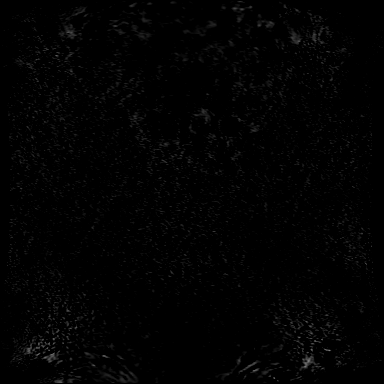
[im 11/64]
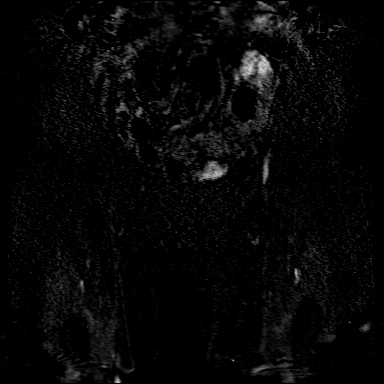
[im 22/64]
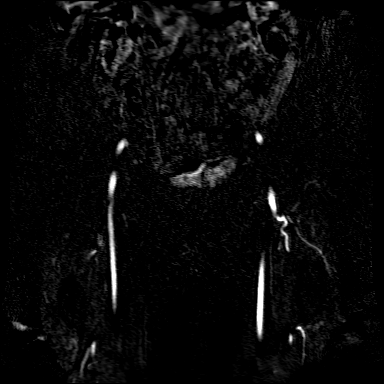
[im 32/64]
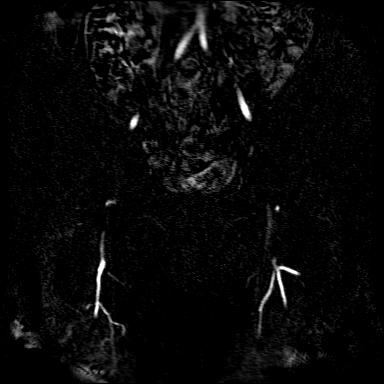
[im 43/64]
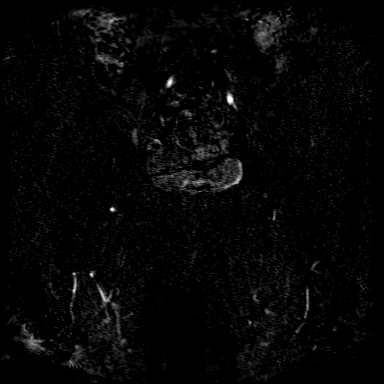
[im 53/64]
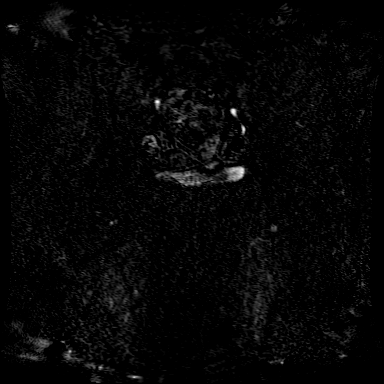
[im 64/64]
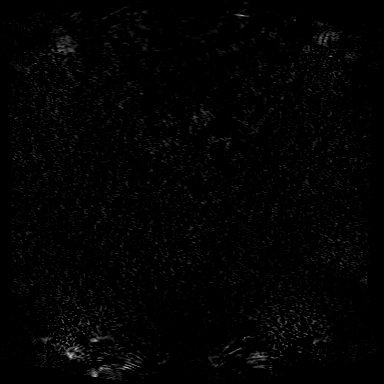

[Series 68: fl3d_cor_+c_knees_tt=1.0s_sub · coronal · 1.5mm · 1.00mm/px · 6 of 53 slices shown]
[im 1/53]
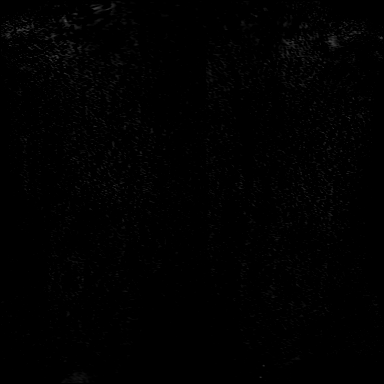
[im 11/53]
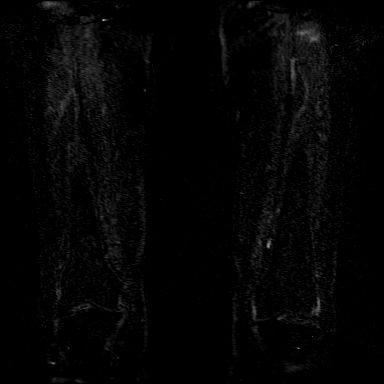
[im 21/53]
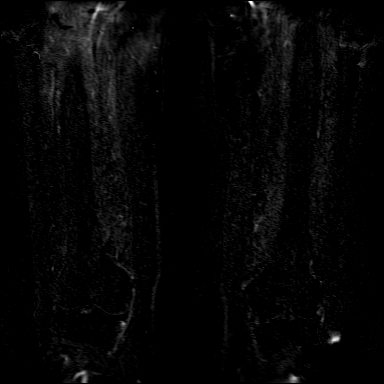
[im 32/53]
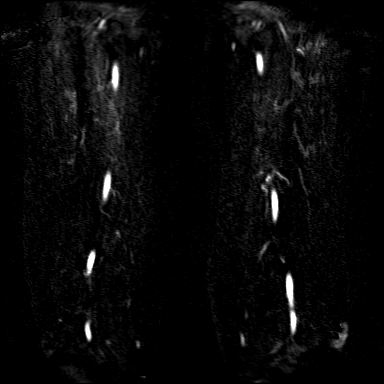
[im 42/53]
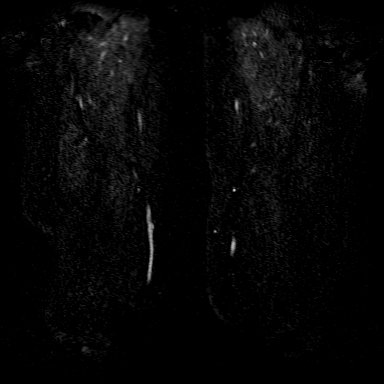
[im 53/53]
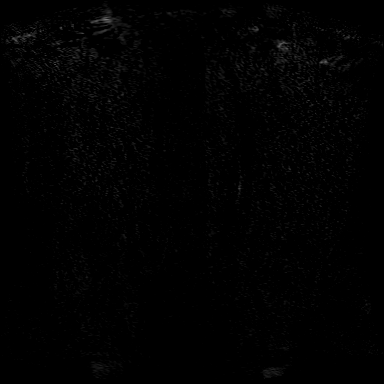

[Series 72: fl3d_cor_+c_lwr_legs_tt=1.0s_sub · coronal · 1.5mm · 1.00mm/px · 7 of 61 slices shown]
[im 1/61]
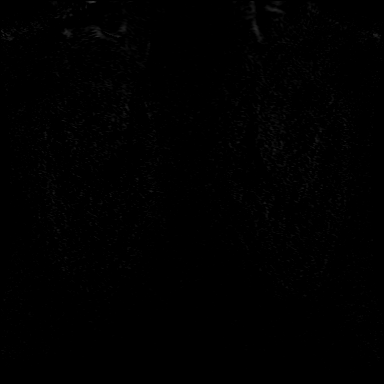
[im 11/61]
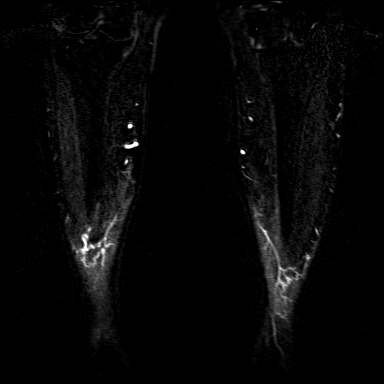
[im 21/61]
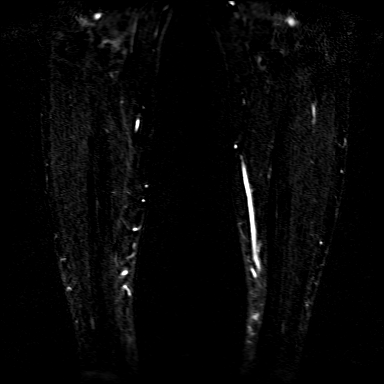
[im 31/61]
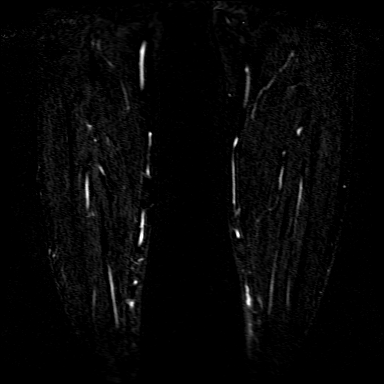
[im 41/61]
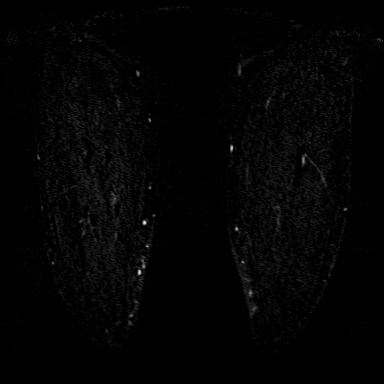
[im 51/61]
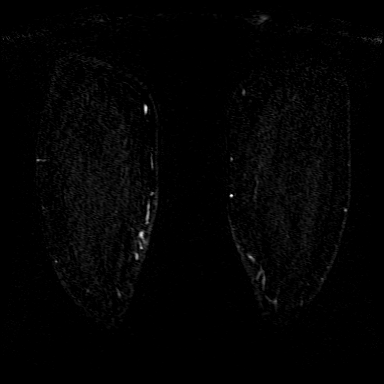
[im 61/61]
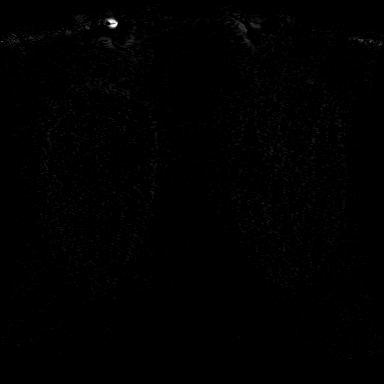

[Series 76: fl3d_cor_+c_feet_tt=1.0s_sub · coronal · 1.5mm · 1.00mm/px · 2 of 64 slices shown]
[im 1/64]
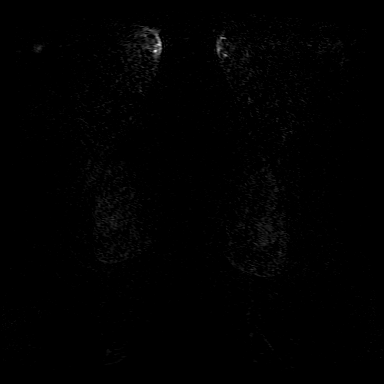
[im 11/64]
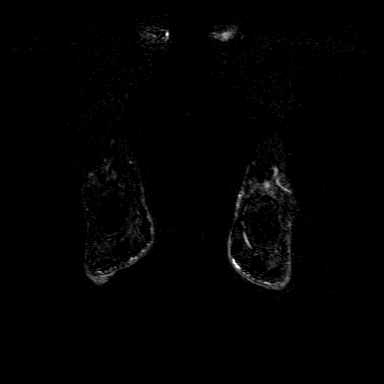

[34 of 40 positions shown; findings below may reference images not displayed]

FINDINGS: Abdominal aortogram: The abdominal aorta is patent.
Right common iliac artery: Patent.
Right external iliac artery: Patent.
Right internal iliac artery: Patent..
Left common iliac artery: Patent.
Left external iliac artery: Patent.
Left internal iliac artery: Patent.
Renal arteries: The right renal artery is patent. The left renal artery is patent.
Celiac artery: The celiac artery is unremarkable.
Superior mesenteric artery: The superior mesenteric artery is patent.
Inferior mesenteric artery: The inferior mesenteric artery is within normal limits.
Right leg arteriogram: The right common femoral, profunda femoral and the superficial femoral arteries are patent. The popliteal artery is unremarkable. There is a three-vessel runoff to the level of
the ankle. Both anterior and posterior tibial arteries continue into the right foot.
Left leg arteriogram: The left common femoral, profunda femoral, and the superficial femoral arteries are also patent. The popliteal artery is unremarkable. There is a three-vessel runoff to the
level of the ankle. Both anterior and posterior tibial arteries continue into the left foot.
2-D and 3-D reconstructed images confirm the above findings.
IMPRESSION: 1.  No evidence of a severe stenosis identified in either right or left leg arterial system.
2.  Patent abdominal aorta and bilateral iliac arteries.
3.  Patent bilateral leg arterial systems.

## 2022-04-28 ENCOUNTER — Ambulatory Visit: Admit: 2022-04-28 | Discharge: 2022-04-28 | Payer: MEDICARE | Attending: Internal Medicine | Primary: Internal Medicine

## 2022-04-28 DIAGNOSIS — Z0000 Encounter for general adult medical examination without abnormal findings: Secondary | ICD-10-CM

## 2022-04-28 NOTE — Patient Instructions (Signed)
Preventing Falls: Care Instructions    Talk to your doctor about the medicines you take. Ask if any of them increase the risk of falls and whether they can be changed or stopped.   Try to exercise regularly. It can help improve your strength and balance. This can help lower your risk of falling.     Practice fall safety and prevention.    Wear low-heeled shoes that fit well and give your feet good support. Talk to your doctor if you have foot problems that make this hard.  Carry a cellphone or wear a medical alert device that you can use to call for help.  Use stepladders instead of chairs to reach high objects. Don't climb if you're at risk for falls. Ask for help, if needed.  Wear the correct eyeglasses, if you need them.    Make your home safer.    Remove rugs, cords, clutter, and furniture from walkways.  Keep your house well lit. Use night-lights in hallways and bathrooms.  Install and use sturdy handrails on stairways.  Wear nonskid footwear, even inside. Don't walk barefoot or in socks without shoes.    Be safe outside.    Use handrails, curb cuts, and ramps whenever possible.  Keep your hands free by using a shoulder bag or backpack.  Try to walk in well-lit areas. Watch out for uneven ground, changes in pavement, and debris.  Be careful in the winter. Walk on the grass or gravel when sidewalks are slippery. Use de-icer on steps and walkways. Add non-slip devices to shoes.    Put grab bars and nonskid mats in your shower or tub and near the toilet. Try to use a shower chair or bath bench when bathing.   Get into a tub or shower by putting in your weaker leg first. Get out with your strong side first. Have a phone or medical alert device in the bathroom with you.   Where can you learn more?  Go to https://www.bennett.info/ and enter G117 to learn more about "Preventing Falls: Care Instructions."  Current as of: November 9, 2022Content Version: 13.7   2006-2023 Healthwise,  Incorporated.   Care instructions adapted under license by [facility]. If you have questions about a medical condition or this instruction, always ask your healthcare professional. [place] any warranty or liability for your use of this information.           Fatigue: Care Instructions  Your Care Instructions     Fatigue is a feeling of tiredness, exhaustion, or lack of energy. You may feel fatigue because of too much or not enough activity. It can also come from stress, lack of sleep, boredom, and poor diet. Many medical problems, such as viral infections, can cause fatigue. Emotional problems, especially depression, are often the cause of fatigue.  Fatigue is most often a symptom of another problem. Treatment for fatigue depends on the cause. For example, if you have fatigue because you have a certain health problem, treating this problem also treats your fatigue. If depression or anxiety is the cause, treatment may help.  Follow-up care is a key part of your treatment and safety. Be sure to make and go to all appointments, and call your doctor if you are having problems. It's also a good idea to know your test results and keep a list of the medicines you take.  How can you care for yourself at home?  Get regular exercise. But don't overdo it. Go back and  forth between rest and exercise.  Get plenty of rest.  Eat a healthy diet. Do not skip meals, especially breakfast.  Reduce your use of caffeine, tobacco, and alcohol. Caffeine is most often found in coffee, tea, cola drinks, and chocolate.  Limit medicines that can cause fatigue. This includes tranquilizers and cold and allergy medicines.  When should you call for help?  Watch closely for changes in your health, and be sure to contact your doctor if:   You have new symptoms such as fever or a rash.    Your fatigue gets worse.    You have been feeling down, depressed, or hopeless. Or you may have lost interest in things that you  usually enjoy.    You are not getting better as expected.   Where can you learn more?  Go to https://www.bennett.info/ and enter W864 to learn more about "Fatigue: Care Instructions."  Current as of: October 20, 2022Content Version: 13.7   2006-2023 Healthwise, Incorporated.   Care instructions adapted under license by [facility] If you have questions about a medical condition or this instruction, always ask your healthcare professional. [place] any warranty or liability for your use of this information.           A Healthy Heart: Care Instructions  Your Care Instructions     Coronary artery disease, also called heart disease, occurs when a substance called plaque builds up in the vessels that supply oxygen-rich blood to your heart muscle. This can narrow the blood vessels and reduce blood flow. A heart attack happens when blood flow is completely blocked. A high-fat diet, smoking, and other factors increase the risk of heart disease.  Your doctor has found that you have a chance of having heart disease. You can do lots of things to keep your heart healthy. It may not be easy, but you can change your diet, exercise more, and quit smoking. These steps really work to lower your chance of heart disease.  Follow-up care is a key part of your treatment and safety. Be sure to make and go to all appointments, and call your doctor if you are having problems. It's also a good idea to know your test results and keep a list of the medicines you take.  How can you care for yourself at home?  Diet   Use less salt when you cook and eat. This helps lower your blood pressure. Taste food before salting. Add only a little salt when you think you need it. With time, your taste buds will adjust to less salt.    Eat fewer snack items, fast foods, canned soups, and other high-salt, high-fat, processed foods.    Read food labels and try to avoid saturated and trans fats. They  increase your risk of heart disease by raising cholesterol levels.    Limit the amount of solid fat-butter, margarine, and shortening-you eat. Use olive, peanut, or canola oil when you cook. Bake, broil, and steam foods instead of frying them.    Eat a variety of fruit and vegetables every day. Dark green, deep orange, red, or yellow fruits and vegetables are especially good for you. Examples include spinach, carrots, peaches, and berries.    Foods high in fiber can reduce your cholesterol and provide important vitamins and minerals. High-fiber foods include whole-grain cereals and breads, oatmeal, beans, [place] rice, citrus fruits, and apples.    Eat lean proteins. Heart-healthy proteins include seafood, lean meats and poultry, eggs, beans, peas, nuts,  seeds, and soy products.    Limit drinks and foods with added sugar. These include candy, desserts, and soda pop.   Lifestyle changes   If your doctor recommends it, get more exercise. Walking is a good choice. Bit by bit, increase the amount you walk every day. Try for at least 30 minutes on most days of the week. You also may want to swim, bike, or do other activities.    Do not smoke. If you need help quitting, talk to your doctor about stop-smoking programs and medicines. These can increase your chances of quitting for good. Quitting smoking may be the most important step you can take to protect your heart. It is never too late to quit.    Limit alcohol to 2 drinks a day for men and 1 drink a day for women. Too much alcohol can cause health problems.    Manage other health problems such as diabetes, high blood pressure, and high cholesterol. If you think you may have a problem with alcohol or drug use, talk to your doctor.   Medicines   Take your medicines exactly as prescribed. Call your doctor if you think you are having a problem with your medicine.    If your doctor recommends aspirin, take the amount directed each day. Make sure you take aspirin  and not another kind of pain reliever, such as acetaminophen (Tylenol).   When should you call for help?   Call 911 if you have symptoms of a heart attack. These may include:   Chest pain or pressure, or a strange feeling in the chest.    Sweating.    Shortness of breath.    Pain, pressure, or a strange feeling in the back, neck, jaw, or upper belly or in one or both shoulders or arms.    Lightheadedness or sudden weakness.    A fast or irregular heartbeat.   After you call 911, the operator may tell you to chew 1 adult-strength or 2 to 4 low-dose aspirin. Wait for an ambulance. Do not try to drive yourself.  Watch closely for changes in your health, and be sure to contact your doctor if you have any problems.  Where can you learn more?  Go to RecruitS[employer].ca and enter F075 to learn more about "A Healthy Heart: Care Instructions."  Current as of: September 7, 2022Content Version: 13.7   2006-2023 Healthwise, Incorporated.   Care instructions adapted under license by [facility]. If you have questions about a medical condition or this instruction, always ask your healthcare professional. Healthwise, Incorporated disclaims any warranty or liability for your use of this information.      Personalized Preventive Plan for Rhonda [name] - 04/28/2022  Medicare offers a range of preventive health benefits. Some of the tests and screenings are paid in full while other may be subject to a deductible, co-insurance, and/or copay.    Some of these benefits include a comprehensive review of your medical history including lifestyle, illnesses that may run in your family, and various assessments and screenings as appropriate.    After reviewing your medical record and screening and assessments performed today your provider may have ordered immunizations, labs, imaging, and/or referrals for you.  A list of these orders (if applicable) as well as your Preventive Care list are included  within your After Visit Summary for your review.    Other Preventive Recommendations:    A preventive eye exam performed by an eye specialist is recommended every 1-2  years to screen for glaucoma; cataracts, macular degeneration, and other eye disorders.  A preventive dental visit is recommended every 6 months.  Try to get at least 150 minutes of exercise per week or 10,000 steps per day on a pedometer .  Order or download the FREE "Exercise & Physical Activity: Your Everyday Guide" from [employer]. Call 848-332-1375 or search [employer] online.  You need 1200-1500 mg of calcium and 1000-2000 IU of vitamin D per day. It is possible to meet your calcium requirement with diet alone, but a vitamin D supplement is usually necessary to meet this goal.  When exposed to the sun, use a sunscreen that protects against both UVA and UVB radiation with an SPF of 30 or greater. Reapply every 2 to 3 hours or after sweating, drying off with a towel, or swimming.  Always wear a seat belt when traveling in a car. Always wear a helmet when riding a bicycle or motorcycle.

## 2022-04-28 NOTE — Progress Notes (Signed)
Medicare Annual Wellness Visit    Rhonda [name] is here for Medicare AWV, Gastroesophageal Reflux, Asthma, and Sleep Apnea    Assessment & Plan   Medicare annual wellness visit, subsequent  Vaccination uptodate.   Colonoscopy normal on 08/23/2016, next in 10 years.   Mammogram ordered.    Hypertension, benign  BP is stable.    Moderate persistent asthma without complication  Continue current inhalers.     Obstructive sleep apnea syndrome  Continue CPAP.    Screening breast examination  Mammogram ordered.    Preventative health care  Recommended flu shot in the fall.    Bilateral leg cramps  Unclear cause. Will check electrolytes including magnesium today.   Recommendations for Preventive Services Due: see orders and patient instructions/AVS.  Recommended screening schedule for the next 5-10 years is provided to the patient in written form: see Patient Instructions/AVS.     Return in about 6 months (around 10/28/2022) for OV 30.     Subjective   Rhonda [name] is here for MWV/PE.  She is doing well.   Sleep is stable and restorative with CPAP.  Breathing is stable.   She reports occasional lower leg cramps especially at night. She has not noted any joint swelling or redness.  She does her low back exercises everyday which improves her occasional low back pain.    She feels the cramps improve once she starts going about her daily work.  Eye exam is uptodate.   Weight is stable.   Bowel habits are normal and regular.   Hearing is stable.    Patient's complete Health Risk Assessment and screening values have been reviewed and are found in Flowsheets. The following problems were reviewed today and where indicated follow up appointments were made and/or referrals ordered.    Positive Risk Factor Screenings with Interventions:    Fall Risk:  Do you feel unsteady or are you worried about falling? : no  2 or more falls in past year?: (!) yes  Fall with injury in past year?: (!) yes     Interventions:    Advised daily low back  exercises.             General HRA Questions:  Select all that apply: (!) New or Increased Pain, New or Increased Fatigue    Pain Interventions:  Continue Meloxicam as needed.     Fatigue Interventions:                     Objective   Vitals:    04/28/22 0804   BP: 137/75   Pulse: 73   Weight: 185 lb 6.4 oz (84.1 kg)   Height: 5\' 6"  (1.676 m)      Body mass index is 29.92 kg/m.        General Appearance: alert and oriented to person, place and time, well developed and well- nourished, in no acute distress  Eyes: pupils equal, round, and reactive to light, extraocular eye movements intact, conjunctivae normal  ENT: tympanic membrane, external ear and ear canal normal bilaterally, nose without deformity, nasal mucosa and turbinates normal without polyps  Neck: supple and non-tender without mass, no thyromegaly or thyroid nodules, no cervical lymphadenopathy  Pulmonary/Chest: clear to auscultation bilaterally- no wheezes, rales or rhonchi, normal air movement, no respiratory distress  Cardiovascular: normal rate, regular rhythm, normal S1 and S2, no murmurs, rubs, clicks, or gallops, distal pulses intact, no carotid bruits  Abdomen: soft, non-tender, non-distended, normal bowel sounds, no  masses or organomegaly  Extremities: no cyanosis, clubbing or edema  Musculoskeletal: normal range of motion, no joint swelling, deformity or tenderness  Neurologic: AAO X 3, no focal deficits.     Allergies   Allergen Reactions    Cefaclor      Other reaction(s): Not Reported This Time    Methylprednisolone Other (See Comments)     Pt states that she has a reaction     Prednisone      Other reaction(s): Not Reported This Time     Prior to Visit Medications    Medication Sig Taking? Authorizing Provider   fluticasone-salmeterol (ADVAIR) 250-50 MCG/ACT AEPB diskus inhaler Inhale 1 puff into the lungs 2 times daily Yes Devon C Malay, APRN - NP   lisinopril (PRINIVIL;ZESTRIL) 40 MG tablet Take 2 tablets by mouth daily Yes Harrington Challenger, MD   meloxicam (MOBIC) 15 MG tablet Take 0.5 tablets by mouth 2 times daily Yes Harrington Challenger, MD   pantoprazole (PROTONIX) 40 MG tablet Take 1 tablet by mouth daily Yes Harrington Challenger, MD   hydroCHLOROthiazide (HYDRODIURIL) 25 MG tablet Take 1 tablet by mouth daily Yes [name], MD   Multiple Vitamins-Minerals (WOMENS DAILY FORMULA) TABS Take 1 tablet by mouth daily Yes Historical Provider, MD   turmeric 500 MG CAPS Take 1 capsule by mouth daily Gummy chewable. 2 per day Yes Historical Provider, MD   albuterol sulfate HFA (PROVENTIL;VENTOLIN;PROAIR) 108 (90 Base) MCG/ACT inhaler Inhale 2 puffs into the lungs every 6 hours as needed Yes Ar Automatic Reconciliation   ipratropium-albuterol (DUONEB) 0.5-2.5 (3) MG/3ML SOLN nebulizer solution Inhale 3 mLs into the lungs every 6 hours as needed Yes Ar Automatic Reconciliation       CareTeam (Including outside providers/suppliers regularly involved in providing care):   Patient Care Team:  Harrington Challenger, MD as PCP - General     Reviewed and updated this visit:  Tobacco  Allergies  Meds  Problems  Med Hx  Surg Hx  Soc Hx  Fam Hx

## 2022-04-29 ENCOUNTER — Inpatient Hospital Stay: Admit: 2022-04-29 | Payer: MEDICARE | Primary: Internal Medicine

## 2022-04-29 DIAGNOSIS — Z0000 Encounter for general adult medical examination without abnormal findings: Secondary | ICD-10-CM

## 2022-04-29 LAB — COMPREHENSIVE METABOLIC PANEL
ALT: 27 U/L (ref 3–35)
AST: 29 U/L (ref 15–40)
Albumin/Globulin Ratio: 1.3
Albumin: 3.8 g/dL (ref 3.5–5.0)
Alk Phosphatase: 50 U/L (ref 35–100)
Anion Gap: 12 mmol/L
BUN: 25 MG/DL — ABNORMAL HIGH (ref 7–20)
Bun/Cre Ratio: 21
CO2: 30 mmol/L (ref 20–32)
Calcium: 9.7 MG/DL (ref 8.8–10.5)
Chloride: 103 mmol/L (ref 100–110)
Creatinine: 1.19 MG/DL (ref 0.40–1.20)
Est, Glom Filt Rate: 48 mL/min/{1.73_m2}
Globulin: 2.9 g/dL
Glucose: 89 mg/dL (ref 75–110)
Potassium: 4.3 mmol/L (ref 3.5–5.0)
Sodium: 141 mmol/L (ref 135–145)
Total Bilirubin: 0.7 mg/dL (ref 0.10–1.20)
Total Protein: 6.7 g/dL (ref 6.2–8.0)

## 2022-04-29 LAB — CBC WITH AUTO DIFFERENTIAL
Absolute Eos #: 0.1 10*3/uL (ref 0.0–0.5)
Absolute Immature Granulocyte: 0 10*3/uL (ref 0.0–0.1)
Absolute Lymph #: 2.2 10*3/uL (ref 1.0–4.5)
Absolute Mono #: 0.6 10*3/uL (ref 0.1–0.8)
Basophils Absolute: 0.1 10*3/uL (ref 0.0–0.2)
Basophils: 1 %
Eosinophils %: 2 %
Hematocrit: 35.6 % — ABNORMAL LOW (ref 36.0–47.0)
Hemoglobin: 12.2 g/dL (ref 12.0–16.0)
Immature Granulocytes: 0 %
Lymphocytes: 26 %
MCH: 31.9 PG (ref 28.0–34.0)
MCHC: 34.3 g/dL (ref 32.0–36.0)
MCV: 93 FL (ref 80.0–100.0)
MPV: 9.1 FL (ref 7.0–12.0)
Monocytes: 6 %
Nucleated RBCs: 0 PER 100 WBC
Platelets: 341 10*3/uL (ref 150–400)
RBC: 3.83 M/uL — ABNORMAL LOW (ref 4.20–5.40)
RDW: 12.6 % (ref 11.5–13.5)
Seg Neutrophils: 65 %
Segs Absolute: 5.6 10*3/uL (ref 1.9–7.8)
WBC: 8.5 10*3/uL (ref 4.8–10.8)
nRBC: 0 10*3/uL

## 2022-04-29 LAB — LIPID PANEL
Cholesterol: 208 MG/DL — ABNORMAL HIGH (ref 115–200)
HDL: 42 MG/DL (ref 40–60)
LDL Cholesterol: 143 MG/DL — ABNORMAL HIGH (ref 65–130)
Non-HDL Cholesterol: 166 mg/dL
Triglycerides: 115 MG/DL (ref 30–150)

## 2022-05-19 ENCOUNTER — Telehealth

## 2022-05-19 NOTE — Telephone Encounter (Signed)
On that same message you responded to I also asked to have someone "interpret"  my blood tests done one the same day as Phil's.  Could some one look at that and tell me if there's anything there to be concerned about?  Thanks, [employer]

## 2022-05-19 NOTE — Telephone Encounter (Signed)
Code changed to Z12.31

## 2022-05-19 NOTE — Telephone Encounter (Signed)
Noted. Called and informed Angie at pre-registration that this has been updated. Expressed understanding. Nothing further needed at this time.

## 2022-05-19 NOTE — Telephone Encounter (Signed)
Tiffany from [employer] called stating that they received the MAM TOMO DIGITAL SCREEN BILATERAL [[id number]] (Order [id number]) order however, medicare will not cover the current diagnosis code of Z12.39 however, they will cover if the diagnosis code is Z12.31.    Please advise,  CB# [phone]

## 2022-05-19 NOTE — Telephone Encounter (Signed)
Please ask her to read mychart message I have sent to her today.

## 2022-05-31 MED ORDER — ALBUTEROL SULFATE HFA 108 (90 BASE) MCG/ACT IN AERS
108 (90 Base) MCG/ACT | Freq: Four times a day (QID) | RESPIRATORY_TRACT | 2 refills | Status: AC | PRN
Start: 2022-05-31 — End: ?

## 2022-05-31 NOTE — Telephone Encounter (Signed)
-----   Message from Rise Mu, [place] sent at 05/31/2022  7:58 AM EDT -----  Regarding: FW: albuterol sulfate HFA/inhalation aerosal  Contact: [phone]    ----- Message -----  From: Annye Asa  Sent: 05/31/2022   7:48 AM EDT  To: [facility]  Subject: albuterol sulfate HFA/inhalation aerosal         Good morning, I have had an aerosol inhaler for many   years and hadn't been using it on a regular basis, but lately I've been using it mornings, 2 puffs.  I checked it this morning, since it doesn't seem to be effective,and found it was dated Oct. 2022.  Do you think I should have a refill or just continue to use fluticasone propionate morning and night?  Thanks, [employer]

## 2022-05-31 NOTE — Telephone Encounter (Signed)
Let her know I sent in a script for Ventolin to her pharmacy.

## 2022-05-31 NOTE — Addendum Note (Signed)
Addended by: Harrington Challenger on: 05/31/2022 03:05 PM     Modules accepted: Orders

## 2022-05-31 NOTE — Telephone Encounter (Signed)
Can you confirm with her what was she using? Is it albuterol rescue inhaler?

## 2022-05-31 NOTE — Telephone Encounter (Signed)
Mychart message sent.

## 2022-06-07 MED ORDER — MELOXICAM 15 MG PO TABS
15 MG | ORAL_TABLET | Freq: Two times a day (BID) | ORAL | 1 refills | Status: DC
Start: 2022-06-07 — End: 2022-12-17

## 2022-06-07 NOTE — Telephone Encounter (Signed)
Medications Requested:  Requested Prescriptions     Pending Prescriptions Disp Refills    meloxicam (MOBIC) 15 MG tablet [Pharmacy Med Name: Meloxicam 15 MG Oral Tablet] 30 tablet 0     Sig: Take 1/2 (one-half) tablet by mouth twice daily       Preferred Pharmacy:   [facility] [address] [place] - [phone] [phone]  [address]  [place] [address]  Phone: [phone] Fax: [phone]      Date of Last Refill: 02/16/2022 for 30tab with 3ref    Prescription Refill Protocol reviewed: Yes    Medication: Meloxicam  Last Office Visit: OV within 1 year  Lab Monitoring: Creatinine Annually  Last Creatinine Result Recorded: 04/29/2022  Category: COX-2 Inhibitor  Length of refill: 1 year  Brand Name: No  Comments: Refill Protocol Passed    Allergy List Reviewed and Verified: Yes    Possible medication to medication interactions reviewed: Yes    Last appt @ PCP Office: 04/28/2022     Future Appointments   Date Time Provider Department Center   08/18/2022  8:00 AM [facility] RM 1 [facility]   11/11/2022  8:30 AM Harrington Challenger, MD [facility]       MOST RECENT BLOOD PRESSURES  BP Readings from Last 3 Encounters:   04/28/22 137/75   12/21/21 (!) 142/74   12/11/21 (!) 168/82         MOST RECENT LAB DATA  Lab Results   Component Value Date/Time    K 4.3 04/29/2022 06:53 AM    ALT 27 04/29/2022 06:53 AM    CHOL 208 04/29/2022 06:53 AM    CHOL 174 02/23/2021 07:20 AM    HGB 12.2 04/29/2022 06:53 AM    HCT 35.6 04/29/2022 06:53 AM

## 2022-06-16 MED ORDER — HYDROCHLOROTHIAZIDE 25 MG PO TABS
25 MG | ORAL_TABLET | Freq: Every day | ORAL | 3 refills | Status: AC
Start: 2022-06-16 — End: 2023-06-14

## 2022-06-16 NOTE — Telephone Encounter (Signed)
Medications Requested:  Requested Prescriptions     Pending Prescriptions Disp Refills    hydroCHLOROthiazide (HYDRODIURIL) 25 MG tablet [Pharmacy Med Name: hydroCHLOROthiazide 25 MG Oral Tablet] 90 tablet 0     Sig: Take 1 tablet by mouth once daily       Preferred Pharmacy:   [facility] [address], [place] - [address] - P [phone] [address] [phone]  [address]  [place] [address]  Phone: [phone] Fax: [phone]      Date of Last Refill: 12/28/21    Prescription Refill Protocol reviewed: Yes    Allergy List Reviewed and Verified: Yes    Possible medication to medication interactions reviewed: Yes    Last appt @ PCP Office: 04/28/2022     Future Appointments   Date Time Provider Department Center   08/18/2022  8:00 AM [facility] RM 1 [facility]   11/11/2022  8:30 AM Harrington Challenger, MD [facility]       MOST RECENT BLOOD PRESSURES  BP Readings from Last 3 Encounters:   04/28/22 137/75   12/21/21 (!) 142/74   12/11/21 (!) 168/82         MOST RECENT LAB DATA  Lab Results   Component Value Date/Time    K 4.3 04/29/2022 06:53 AM    ALT 27 04/29/2022 06:53 AM    CHOL 208 04/29/2022 06:53 AM    CHOL 174 02/23/2021 07:20 AM    HGB 12.2 04/29/2022 06:53 AM    HCT 35.6 04/29/2022 06:53 AM      Diuretics Protocol Passed 06/16/2022 03:10 PM   Protocol Details  Last creatinine level resulted within the past 12 months    Last potassium level normal, within the past 12 months    Last sodium level normal, within the past 12 months    Visit with authorizing provider in past 9 months or upcoming 90 days

## 2022-07-29 MED ORDER — ALBUTEROL SULFATE HFA 108 (90 BASE) MCG/ACT IN AERS
10890 (90 Base) MCG/ACT | Freq: Four times a day (QID) | RESPIRATORY_TRACT | 2 refills | Status: AC | PRN
Start: 2022-07-29 — End: 2023-11-23

## 2022-07-29 NOTE — Telephone Encounter (Signed)
From: Tacy Dura  To: Dr. Jerilee Field  Sent: 05/31/2022 7:48 AM EDT  Subject: albuterol sulfate HFA/inhalation aerosal    Good morning, I have had an aerosol inhaler for many   years and hadn't been using it on a regular basis, but lately I've been using it mornings, 2 puffs. I checked it this morning, since it doesn't seem to be effective,and found it was dated Oct. 2022. Do you think I should have a refill or just continue to use fluticasone propionate morning and night?  Thanks, [employer]

## 2022-07-29 NOTE — Telephone Encounter (Signed)
Please see pt's message and advise

## 2022-07-29 NOTE — Telephone Encounter (Signed)
Albuterol inhaler sent. Please ask her what aerosol inhaler is she referring to?

## 2022-07-30 NOTE — Telephone Encounter (Signed)
-----   Message from Sibyl Parr, [place] sent at 07/30/2022  7:20 AM EDT -----  Regarding: FW: albuterol sulfate HFA/inhalation aerosal  Contact: [phone]    ----- Message -----  From: Annia Belt, CMA  Sent: 07/29/2022   6:07 PM EDT  To: [facility]  Subject: FW: albuterol sulfate HFA/inhalation aerosal       ----- Message -----  From: Tacy Dura  Sent: 07/29/2022   6:02 PM EDT  To: [facility] Clinical Staff  Subject: albuterol sulfate HFA/inhalation aerosal         Just checked again and the Albuterol Sulfate Inhalation Aerosol inhaler I got earlier still has 120 inhales...never saw the measures on the side of the inhaler before.  Sorry to bother you.  Pamala Hurry

## 2022-07-30 NOTE — Telephone Encounter (Signed)
What is her question?

## 2022-08-18 ENCOUNTER — Inpatient Hospital Stay: Admit: 2022-08-18 | Payer: MEDICARE | Attending: Internal Medicine | Primary: Internal Medicine

## 2022-08-18 DIAGNOSIS — Z1231 Encounter for screening mammogram for malignant neoplasm of breast: Secondary | ICD-10-CM

## 2022-09-06 MED ORDER — LISINOPRIL 40 MG PO TABS
40 MG | ORAL_TABLET | Freq: Every day | ORAL | 3 refills | Status: DC
Start: 2022-09-06 — End: 2023-10-05

## 2022-09-06 NOTE — Telephone Encounter (Signed)
Medications Requested:  Requested Prescriptions     Pending Prescriptions Disp Refills    lisinopril (PRINIVIL;ZESTRIL) 40 MG tablet [Pharmacy Med Name: Lisinopril 40 MG Oral Tablet] 180 tablet 0     Sig: Take 2 tablets by mouth once daily       Preferred Pharmacy:   [facility] [address] [place] Plump [phone]  [place]  [place] [address]  Phone: [phone] Fax: [phone]      Date of Last Refill: 02/17/2022    Prescription Refill Protocol reviewed:YES    Allergy List Reviewed and Verified: YES    Possible medication to medication interactions reviewed: YES    Last appt @ PCP Office: 04/28/2022     Future Appointments   Date Time Provider [place]   11/11/2022  8:30 AM Jerilee Field, MD [facility] AMB       MOST RECENT BLOOD PRESSURES  BP Readings from Last 3 Encounters:   04/28/22 137/75   12/21/21 (!) 142/74   12/11/21 (!) 168/82         MOST RECENT LAB DATA  Lab Results   Component Value Date/Time    K 4.3 04/29/2022 06:53 AM    ALT 27 04/29/2022 06:53 AM    CHOL 208 04/29/2022 06:53 AM    CHOL 174 02/23/2021 07:20 AM    HGB 12.2 04/29/2022 06:53 AM    HCT 35.6 04/29/2022 06:53 AM

## 2022-11-11 ENCOUNTER — Encounter: Payer: MEDICARE | Attending: Internal Medicine | Primary: Internal Medicine

## 2022-11-24 ENCOUNTER — Encounter: Payer: MEDICARE | Attending: Internal Medicine | Primary: Internal Medicine

## 2022-11-29 ENCOUNTER — Ambulatory Visit: Admit: 2022-11-29 | Discharge: 2022-11-29 | Payer: MEDICARE | Attending: Internal Medicine | Primary: Internal Medicine

## 2022-11-29 DIAGNOSIS — L0292 Furuncle, unspecified: Secondary | ICD-10-CM

## 2022-11-29 DIAGNOSIS — K219 Gastro-esophageal reflux disease without esophagitis: Secondary | ICD-10-CM

## 2022-11-29 MED ORDER — DOXYCYCLINE HYCLATE 100 MG PO TABS
100 MG | ORAL_TABLET | Freq: Two times a day (BID) | ORAL | 0 refills | Status: AC
Start: 2022-11-29 — End: 2022-12-06

## 2022-11-29 NOTE — Progress Notes (Signed)
ST [facility] INTERNAL MEDICINE   [place] [address]    ASSESSMENT AND PLAN     1. GERD without esophagitis  Stable on Pantoprazole.    2. Hypertension, benign  Repeat BP is acceptable. Continue current medication.     3. Uncomplicated asthma, unspecified asthma severity, unspecified whether persistent  Breathing is stable. Continue current inhalers.     4. Obstructive sleep apnea syndrome  Sleep is stable on CPAP.Continue CPAP.     5. Furuncle  Growth appears to be a furuncle. I will start her on doxycycline for 7 days. Advised daily dressing with topical antibiotic and clean gauze pad. She will give me a call if the swelling does not regress.   - doxycycline hyclate (VIBRA-TABS) 100 MG tablet; Take 1 tablet by mouth 2 times daily for 7 days  Dispense: 14 tablet; Refill: 0       CHIEF COMPLAINT   Rhonda [name] is a 77 y.o. female who presents to clinic for   Chief Complaint   Patient presents with    Follow-up        HPI   Rhonda [name] is here for a follow up.   She reports a boil like growth below the right breast area for about 5 days. She has felt it increase in size and turn red. She has not noted any discharge but feels a hard nodule around it.   She has not seen any similar swelling anywhere else.   Repeat BP is 140/80 mm Hg. She denies any anginal symptoms. Home BP is lower.   Sleep is stable on CPAP.   GERD symptoms are stable on Pantoprazole.         Current Outpatient Medications:     doxycycline hyclate (VIBRA-TABS) 100 MG tablet, Take 1 tablet by mouth 2 times daily for 7 days, Disp: 14 tablet, Rfl: 0    lisinopril (PRINIVIL;ZESTRIL) 40 MG tablet, Take 2 tablets by mouth once daily, Disp: 180 tablet, Rfl: 3    albuterol sulfate HFA (VENTOLIN HFA) 108 (90 Base) MCG/ACT inhaler, Inhale 2 puffs into the lungs 4 times daily as needed for Wheezing, Disp: 18 g, Rfl: 2    hydroCHLOROthiazide (HYDRODIURIL) 25 MG tablet, Take 1 tablet by mouth daily, Disp: 90 tablet, Rfl: 3    meloxicam (MOBIC) 15 MG  tablet, Take 0.5 tablets by mouth in the morning and at bedtime, Disp: 90 tablet, Rfl: 1    fluticasone-salmeterol (ADVAIR) 250-50 MCG/ACT AEPB diskus inhaler, Inhale 1 puff into the lungs 2 times daily, Disp: 3 each, Rfl: 3    pantoprazole (PROTONIX) 40 MG tablet, Take 1 tablet by mouth daily, Disp: 90 tablet, Rfl: 3    Multiple Vitamins-Minerals (WOMENS DAILY FORMULA) TABS, Take 1 tablet by mouth daily, Disp: , Rfl:     turmeric 500 MG CAPS, Take 1 capsule by mouth daily Gummy chewable. 2 per day, Disp: , Rfl:      Active Problem List, Past Medical, Surgical, Family, Social History and Allergies reviewed and updated.        REVIEW OF SYSTEMS   Review of Systems   Constitutional:  Negative for chills and fever.   HENT:  Negative for sore throat.    Respiratory:  Negative for cough and shortness of breath.    Cardiovascular:  Negative for chest pain and leg swelling.   Gastrointestinal:  Negative for abdominal pain.   Musculoskeletal:  Negative for back pain.   Neurological:  Negative for dizziness, tremors  and weakness.        PHYSICAL EXAM   BP (!) 143/80 (Site: Right Upper Arm, Position: Sitting, Cuff Size: Large Adult)   Pulse 81   Resp 16   Ht 1.676 m (5' 5.98")   Wt 83.9 kg (185 lb)   BMI 29.87 kg/m       Physical Exam  Vitals and nursing note reviewed.   Constitutional:       Appearance: Normal appearance.   Eyes:      General: No scleral icterus.     Conjunctiva/sclera: Conjunctivae normal.   Cardiovascular:      Rate and Rhythm: Normal rate and regular rhythm.      Heart sounds: Normal heart sounds.   Pulmonary:      Breath sounds: Normal breath sounds. No wheezing or rhonchi.   Abdominal:      General: Bowel sounds are normal.      Palpations: Abdomen is soft.   Skin:     Comments: Boil like growth over the left upper abdomen below the left breast with central induration. No discharge noted.    Neurological:      General: No focal deficit present.      Mental Status: She is alert and oriented to  person, place, and time. Mental status is at baseline.            LABS/IMAGING     No results found for any visits on 11/29/22.

## 2022-12-17 MED ORDER — MELOXICAM 15 MG PO TABS
15 MG | ORAL_TABLET | ORAL | 0 refills | Status: AC
Start: 2022-12-17 — End: 2023-02-25

## 2022-12-17 NOTE — Telephone Encounter (Signed)
Medications Requested:  Requested Prescriptions     Pending Prescriptions Disp Refills    meloxicam (MOBIC) 15 MG tablet [Pharmacy Med Name: Meloxicam 15 MG Oral Tablet] 90 tablet 0     Sig: TAKE 1/2 (ONE-HALF) TABLET BY MOUTH IN THE MORNING AND AT BEDTIME       Preferred Pharmacy:   [facility] [address] [place] [phone]  [place]  [place] [address]  Phone: [phone] Fax: [phone]    "Medication: Meloxicam  Last Office Visit: OV within 1 year  Lab Monitoring: Creatinine Annually  Category: COX-2 Inhibitor  Length of refill: 1 year  Brand Name: No  Comments:"  Date of Last Refill: 06/07/2022    Prescription Refill Protocol reviewed: Yes    Allergy List Reviewed and Verified: Yes    Possible medication to medication interactions reviewed: Yes    Last appt @ PCP Office: 11/29/2022     Future Appointments   Date Time Provider [place]   06/08/2023  8:30 AM Jerilee Field, MD [facility] AMB       MOST RECENT BLOOD PRESSURES  BP Readings from Last 3 Encounters:   11/29/22 (!) 140/80   04/28/22 137/75   12/21/21 (!) 142/74         MOST RECENT LAB DATA  Lab Results   Component Value Date/Time    K 4.3 04/29/2022 06:53 AM    ALT 27 04/29/2022 06:53 AM    CHOL 208 04/29/2022 06:53 AM    CHOL 174 02/23/2021 07:20 AM    HGB 12.2 04/29/2022 06:53 AM    HCT 35.6 04/29/2022 06:53 AM

## 2023-01-17 MED ORDER — PANTOPRAZOLE SODIUM 40 MG PO TBEC
40 MG | ORAL_TABLET | Freq: Every day | ORAL | 3 refills | Status: DC
Start: 2023-01-17 — End: 2024-01-02

## 2023-01-17 NOTE — Telephone Encounter (Signed)
Rx Refill Protocol Proton Pump Inhibitors Passed03/08/2023 08:26 AM    Visit with authorizing provider in past 9 months or upcoming 90 days    No positive pregnancy test or active pregnancy on record in past 12 months    No history of osteoporosis    No history of chronic kidney disease (CKD)       Medications Requested:  Requested Prescriptions     Pending Prescriptions Disp Refills    pantoprazole (PROTONIX) 40 MG tablet [Pharmacy Med Name: Pantoprazole Sodium 40 MG Oral Tablet Delayed Release] 90 tablet 0     Sig: Take 1 tablet by mouth once daily       Preferred Pharmacy:   [facility] [address] [place] [phone]  [place]  [place] [address]  Phone: [phone] Fax: [phone]      Date of Last Refill: 01/20/2022    Prescription Refill Protocol reviewed:YES    Allergy List Reviewed and Verified: YES    Possible medication to medication interactions reviewed: YES    Last appt @ PCP Office: 11/29/2022     Future Appointments   Date Time Provider [place]   06/08/2023  8:30 AM Jerilee Field, MD [facility] AMB       MOST RECENT BLOOD PRESSURES  BP Readings from Last 3 Encounters:   11/29/22 (!) 140/80   04/28/22 137/75   12/21/21 (!) 142/74         MOST RECENT LAB DATA  Lab Results   Component Value Date/Time    K 4.3 04/29/2022 06:53 AM    ALT 27 04/29/2022 06:53 AM    CHOL 208 04/29/2022 06:53 AM    CHOL 174 02/23/2021 07:20 AM    HGB 12.2 04/29/2022 06:53 AM    HCT 35.6 04/29/2022 06:53 AM

## 2023-01-19 MED ORDER — FLUTICASONE-SALMETEROL 250-50 MCG/ACT IN AEPB
250-50 | RESPIRATORY_TRACT | 3 refills | 75.00000 days | Status: DC
Start: 2023-01-19 — End: 2024-03-09

## 2023-01-19 NOTE — Telephone Encounter (Signed)
Medications Requested:  Requested Prescriptions     Pending Prescriptions Disp Refills    fluticasone-salmeterol (ADVAIR) 250-50 MCG/ACT AEPB diskus inhaler [Pharmacy Med Name: Fluticasone-Salmeterol 250-50 MCG/DOSE Inhalation Aerosol Powder Breath Activated] 180 each 0     Sig: INHALE 1 DOSE BY MOUTH TWICE DAILY       Preferred Pharmacy:   [facility] [address], [place] [phone]  [place]  [place] [address]  Phone: [phone] Fax: [phone]      Date of Last Refill: 02/23/22    Prescription Refill Protocol reviewed:YES    Allergy List Reviewed and Verified: YES    Possible medication to medication interactions reviewed: YES    Last appt @ PCP Office: 11/29/2022     Future Appointments   Date Time Provider [place]   06/08/2023  8:30 AM Jerilee Field, MD [facility] AMB       MOST RECENT BLOOD PRESSURES  BP Readings from Last 3 Encounters:   11/29/22 (!) 140/80   04/28/22 137/75   12/21/21 (!) 142/74         MOST RECENT LAB DATA  Lab Results   Component Value Date/Time    K 4.3 04/29/2022 06:53 AM    ALT 27 04/29/2022 06:53 AM    CHOL 208 04/29/2022 06:53 AM    CHOL 174 02/23/2021 07:20 AM    HGB 12.2 04/29/2022 06:53 AM    HCT 35.6 04/29/2022 06:53 AM

## 2023-02-25 MED ORDER — MELOXICAM 15 MG PO TABS
15 MG | ORAL_TABLET | ORAL | 3 refills | Status: DC
Start: 2023-02-25 — End: 2024-02-15

## 2023-02-25 NOTE — Telephone Encounter (Signed)
"  Medication: Meloxicam  Last Office Visit: OV within 1 year  Lab Monitoring: Creatinine Annually  Category: COX-2 Inhibitor  Length of refill: 1 year  Brand Name: No  Comments:"     Medications Requested:  Requested Prescriptions     Pending Prescriptions Disp Refills    meloxicam (MOBIC) 15 MG tablet 90 tablet 0     Sig: TAKE 1/2 (ONE-HALF) TABLET BY MOUTH IN THE MORNING AND AT BEDTIME       Preferred Pharmacy:   [facility] [address], [place] - [address] - P [phone] - F [phone]  [address]  [place] [address]  Phone: [phone] Fax: [phone]      Date of Last Refill: 12/17/2022    Prescription Refill Protocol reviewed:YES    Allergy List Reviewed and Verified: YES    Possible medication to medication interactions reviewed: YES    Last appt @ PCP Office: 11/29/2022     Future Appointments   Date Time Provider Department Center   06/08/2023  8:30 AM Harrington Challenger, MD [facility] AMB       MOST RECENT BLOOD PRESSURES  BP Readings from Last 3 Encounters:   11/29/22 (!) 140/80   04/28/22 137/75   12/21/21 (!) 142/74         MOST RECENT LAB DATA  Lab Results   Component Value Date/Time    K 4.3 04/29/2022 06:53 AM    ALT 27 04/29/2022 06:53 AM    CHOL 208 04/29/2022 06:53 AM    CHOL 174 02/23/2021 07:20 AM    HGB 12.2 04/29/2022 06:53 AM    HCT 35.6 04/29/2022 06:53 AM

## 2023-02-25 NOTE — Telephone Encounter (Signed)
Patient's name and date of birth verified at start of call.   Incoming refill request.  Caller has been notified that we require a minimum of 2 business days for refill processing. (This does not include weekends, holidays, etc.)      Rhonda [name]  07/17/1946      Confirmed best contact number:   Home Phone [phone] (home)    Medications Requested:  Requested Prescriptions     Pending Prescriptions Disp Refills    meloxicam (MOBIC) 15 MG tablet 90 tablet 0       Preferred Pharmacy:   [facility] [address], [place] - [address] - P [phone] Carmon Ginsberg [phone]  [address]  [place] [address]  Phone: [phone] Fax: [phone]    Has patient already contacted pharmacy to confirm no refills were on file: Yes    Notes for office regarding medication request:  Pt said she needs this prescription sent to [facility] in [place]. She has been down there for a week because her husband is having cardiac surgery on Wednesday.       Other instructions and notes:  Last visit with provider: 11/29/2022  Next scheduled visit with provider: 06/08/2023  *If next visit is not scheduled, book next needed visit or document if recall was added to list prior to sending for processing*  Inform patient that this needs to be on file before sending request as we do require for them to remain up-to-date with their recommended healthcare in order to avoid any interruptions in our ability to provide ongoing care such as refills.     Patient MyChart Status:  For Active Patients - Patient has been notified that they will receive an automated notification via MyChart once their script has been processed.   For Inactive Patients - Patient is aware that we have a patient portal, MyChart, which offers many benefits such as being able to request their refills electronically and receive automated notifications when scripts are processed.  In addition, they can schedule and manage appointments, view their testing results and visit  notes, and stay connected with their care team.  Patient offered MyChart today.  Patient ACTIVE.  (Send link for set up if accepted)

## 2023-03-25 ENCOUNTER — Encounter

## 2023-03-31 ENCOUNTER — Encounter (INDEPENDENT_AMBULATORY_CARE_PROVIDER_SITE_OTHER): Payer: Self-pay | Admitting: Hospital

## 2023-06-08 ENCOUNTER — Ambulatory Visit: Admit: 2023-06-08 | Discharge: 2023-06-08 | Payer: MEDICARE | Attending: Internal Medicine | Primary: Internal Medicine

## 2023-06-08 DIAGNOSIS — Z0000 Encounter for general adult medical examination without abnormal findings: Secondary | ICD-10-CM

## 2023-06-08 NOTE — Progress Notes (Signed)
ST [facility] INTERNAL MEDICINE   [name]  [place] [address]    ASSESSMENT AND PLAN     1. Preventative health care  Will obtain fasting labs.   - CBC with Auto Differential; Future  - Comprehensive Metabolic Panel; Future  - Lipid Panel; Future    2. Obstructive sleep apnea syndrome  Continue CPAP.    3. Hypertension, benign  BP is stable. Continue Hctz.    4. GERD without esophagitis  Stable on Pantoprazole.    5. Uncomplicated asthma, unspecified asthma severity, unspecified whether persistent  Stable on albuterol and Advair.      Total Time spent in care of patient 30 Minutes.   CHIEF COMPLAINT   DEVONNE KITCHEN is a 77 y.o. female who presents to clinic for   Chief Complaint   Patient presents with    Follow-up     Pt here with husband phillip for follow up . No new concerns Tamala Julian, MA          HPI   Briannia is here for follow up.   She is doing well.  Sleep is stable on CPAP.   Repeat BP is 130/85 mm hg. She is compliant with her medications.   GERD symptoms are stable on Pantoprazole.   Breathing is stable on current inhalers.         Current Outpatient Medications:     meloxicam (MOBIC) 15 MG tablet, TAKE 1/2 (ONE-HALF) TABLET BY MOUTH IN THE MORNING AND AT BEDTIME, Disp: 90 tablet, Rfl: 3    fluticasone-salmeterol (ADVAIR) 250-50 MCG/ACT AEPB diskus inhaler, INHALE 1 DOSE BY MOUTH TWICE DAILY, Disp: 180 each, Rfl: 3    pantoprazole (PROTONIX) 40 MG tablet, Take 1 tablet by mouth once daily, Disp: 90 tablet, Rfl: 3    lisinopril (PRINIVIL;ZESTRIL) 40 MG tablet, Take 2 tablets by mouth once daily, Disp: 180 tablet, Rfl: 3    albuterol sulfate HFA (VENTOLIN HFA) 108 (90 Base) MCG/ACT inhaler, Inhale 2 puffs into the lungs 4 times daily as needed for Wheezing, Disp: 18 g, Rfl: 2    hydroCHLOROthiazide (HYDRODIURIL) 25 MG tablet, Take 1 tablet by mouth daily, Disp: 90 tablet, Rfl: 3    Multiple Vitamins-Minerals (WOMENS DAILY FORMULA) TABS, Take 1 tablet by mouth daily, Disp: , Rfl:     turmeric  500 MG CAPS, Take 1 capsule by mouth daily Gummy chewable. 2 per day, Disp: , Rfl:      Active Problem List, Past Medical, Surgical, Family, Social History and Allergies reviewed and updated.        REVIEW OF SYSTEMS   Review of Systems   Constitutional:  Negative for chills and fever.   HENT:  Negative for congestion and sore throat.    Respiratory:  Negative for cough and shortness of breath.    Cardiovascular:  Negative for chest pain and leg swelling.   Gastrointestinal:  Negative for abdominal pain.   Musculoskeletal:  Negative for back pain.   Neurological:  Negative for dizziness and weakness.        PHYSICAL EXAM   BP 130/85   Pulse 74   Wt 78 kg (172 lb)   SpO2 96%   BMI 27.77 kg/m       Physical Exam  Vitals and nursing note reviewed.   Constitutional:       Appearance: Normal appearance.   Eyes:      General: No scleral icterus.     Conjunctiva/sclera: Conjunctivae normal.  Cardiovascular:      Rate and Rhythm: Normal rate and regular rhythm.      Heart sounds: Normal heart sounds.   Pulmonary:      Breath sounds: Normal breath sounds. No wheezing or rhonchi.   Abdominal:      Palpations: Abdomen is soft.   Musculoskeletal:      Right lower leg: No edema.      Left lower leg: No edema.   Neurological:      General: No focal deficit present.      Mental Status: She is alert and oriented to person, place, and time. Mental status is at baseline.          LABS/IMAGING     No results found for any visits on 06/08/23.

## 2023-06-08 NOTE — Telephone Encounter (Signed)
While at check in, pt was late for her regular Medicare appt at 8:30 with her husband's appt at 9:30.     Pt was said she was told on the phone yesterday that her appt was at 9:30 along with her husband's appt.     PSR called MA of PCP and MA spoke with PCP. Switched both appts to OV and check both in.

## 2023-06-09 ENCOUNTER — Inpatient Hospital Stay: Admit: 2023-06-09 | Payer: MEDICARE | Primary: Internal Medicine

## 2023-06-09 DIAGNOSIS — Z0000 Encounter for general adult medical examination without abnormal findings: Secondary | ICD-10-CM

## 2023-06-09 LAB — CBC WITH AUTO DIFFERENTIAL
Basophils %: 1 %
Basophils Absolute: 0.1 10*3/uL (ref 0.0–0.2)
Eosinophils %: 1 %
Eosinophils Absolute: 0.1 10*3/uL (ref 0.0–0.5)
Hematocrit: 35.6 % — ABNORMAL LOW (ref 36.0–47.0)
Hemoglobin: 12.3 g/dL (ref 12.0–16.0)
Immature Granulocytes %: 1 %
Immature Granulocytes Absolute: 0 10*3/uL (ref 0.0–0.1)
Lymphocytes Absolute: 2.1 10*3/uL (ref 1.0–4.5)
Lymphocytes: 24 %
MCH: 31.1 PG (ref 28.0–34.0)
MCHC: 34.6 g/dL (ref 32.0–36.0)
MCV: 89.9 FL (ref 80.0–100.0)
MPV: 8.8 FL (ref 7.0–12.0)
Monocytes %: 6 %
Monocytes Absolute: 0.5 10*3/uL (ref 0.1–0.8)
Neutrophils Absolute: 5.9 10*3/uL (ref 1.9–7.8)
Nucleated RBCs: 0 PER 100 WBC
Platelets: 360 10*3/uL (ref 150–400)
RBC: 3.96 M/uL — ABNORMAL LOW (ref 4.20–5.40)
RDW: 12.3 % (ref 11.5–13.5)
Seg Neutrophils: 67 %
WBC: 8.7 10*3/uL (ref 4.8–10.8)
nRBC: 0 10*3/uL

## 2023-06-09 LAB — COMPREHENSIVE METABOLIC PANEL
ALT: 20 U/L (ref 3–35)
AST: 24 U/L (ref 15–40)
Albumin/Globulin Ratio: 1.4
Albumin: 3.8 g/dL (ref 3.5–5.0)
Alk Phosphatase: 54 U/L (ref 35–100)
Anion Gap: 8 mmol/L (ref 8–16)
BUN/Creatinine Ratio: 20
BUN: 26 MG/DL — ABNORMAL HIGH (ref 7–20)
CO2: 29 mmol/L (ref 20–32)
Calcium: 9.5 MG/DL (ref 8.8–10.5)
Chloride: 100 mmol/L (ref 100–110)
Creatinine: 1.33 MG/DL — ABNORMAL HIGH (ref 0.40–1.20)
Est, Glom Filt Rate: 41 mL/min/{1.73_m2}
Globulin: 2.7 g/dL
Glucose: 94 mg/dL (ref 75–110)
Potassium: 4.1 mmol/L (ref 3.5–5.0)
Sodium: 137 mmol/L (ref 135–145)
Total Bilirubin: 0.6 mg/dL (ref 0.10–1.20)
Total Protein: 6.5 g/dL (ref 6.2–8.0)

## 2023-06-09 LAB — LIPID PANEL
Cholesterol, Total: 189 MG/DL (ref 115–200)
HDL: 43 MG/DL (ref 40–60)
LDL Cholesterol: 125.4 MG/DL (ref 65–130)
Non-HDL Cholesterol: 146 mg/dL
Triglycerides: 103 MG/DL (ref 30–150)

## 2023-06-14 MED ORDER — HYDROCHLOROTHIAZIDE 25 MG PO TABS
25 | ORAL_TABLET | Freq: Every day | ORAL | 3 refills | Status: DC
Start: 2023-06-14 — End: 2024-06-13

## 2023-06-14 NOTE — Telephone Encounter (Signed)
Medications Requested:  Requested Prescriptions     Pending Prescriptions Disp Refills    hydroCHLOROthiazide (HYDRODIURIL) 25 MG tablet [Pharmacy Med Name: hydroCHLOROthiazide 25 MG Oral Tablet] 90 tablet 0     Sig: Take 1 tablet by mouth once daily       Preferred Pharmacy:   [facility] [address] [place] - [address] - P [phone] - F [phone]  [address]  [place] [address]  Phone: [phone] Fax: [phone]    [facility] [address], [place] - [address] - P [phone] Carmon Ginsberg [phone]  [address]  [place] [address]  Phone: [phone] Fax: [phone]     Name from pharmacy: hydroCHLOROthiazide 25 MG Oral Tablet         Will file in chart as: hydroCHLOROthiazide (HYDRODIURIL) 25 MG tablet    Sig: Take 1 tablet by mouth once daily    Disp: 90 tablet    Refills: 0    Start: 06/13/2023    Class: Normal    Non-formulary    Last ordered: 12 months ago (06/16/2022) by [name], MD    Last refill: 12/28/2022    Rx #: [id number]    Diuretics Protocol Passed08/03/2023 10:05 AM   Protocol Details Last creatinine level resulted within the past 12 months    Last potassium level normal, within the past 12 months    Last sodium level normal, within the past 12 months    Visit with authorizing provider in past 9 months or upcoming 90 days      To be filled at: [facility] [address], [place] - [address] Demetrius Charity [phone] Carmon Ginsberg [phone]           Date of Last Refill: 06/16/2022    Prescription Refill Protocol reviewed: Yes, RX passes protocol, RX sent.    Signed by Charlie Pitter, MA     Allergy List Reviewed and Verified: Yes    Possible medication to medication interactions reviewed: Yes    Last appt @ PCP Office: 06/08/2023     Future Appointments   Date Time Provider Department Center   08/22/2023  7:30 AM S[facility]AM RM 1 [facility]   12/14/2023  9:00 AM Harrington Challenger, MD [facility] AMB       MOST RECENT BLOOD PRESSURES  BP Readings from Last 3 Encounters:   06/08/23 130/85    11/29/22 (!) 140/80   04/28/22 137/75         MOST RECENT LAB DATA  Lab Results   Component Value Date/Time    K 4.1 06/09/2023 07:55 AM    ALT 20 06/09/2023 07:55 AM    CHOL 189 06/09/2023 07:55 AM    HGB 12.3 06/09/2023 07:55 AM    HCT 35.6 06/09/2023 07:55 AM

## 2023-08-14 ENCOUNTER — Telehealth

## 2023-08-14 NOTE — Telephone Encounter (Signed)
Please ask Nadira to recheck BMP since it was mildly decreased in the past?Lab order sent.

## 2023-08-15 NOTE — Telephone Encounter (Signed)
Pt called back and message was relayed. She stated that she is currently in [place] and has been for 6-8 weeks now due to husband having surgeries and that she will get it done when she gets back.

## 2023-08-15 NOTE — Telephone Encounter (Signed)
Placed call no answer lmom for a cb It is okay for non-clinical staff to relay my exact comments to the patient if they cannot be reached and call back for results.      Michelle Y Keller, MA

## 2023-08-15 NOTE — Telephone Encounter (Signed)
Noted . Tamala Julian, MA

## 2023-10-05 MED ORDER — LISINOPRIL 40 MG PO TABS
40 | ORAL_TABLET | Freq: Every day | ORAL | 3 refills | Status: DC
Start: 2023-10-05 — End: 2024-10-10

## 2023-10-05 NOTE — Telephone Encounter (Signed)
 Medications Requested:  Requested Prescriptions     Pending Prescriptions Disp Refills    lisinopril (PRINIVIL;ZESTRIL) 40 MG tablet [Pharmacy Med Name: Lisinopril 40 MG Oral Tablet] 180 tablet 0     Sig: Take 2 tablets by mouth once daily       Preferred

## 2023-10-05 NOTE — Telephone Encounter (Signed)
 ACE Inhibitors Protocol Passed11/26/2024 08:06 AM   Protocol Details Last creatinine level resulted within the past 12 months    Last potassium level normal, within the past 12 months    Visit with authorizing provider in past 9 months or upcoming 90 days

## 2023-11-23 MED ORDER — ALBUTEROL SULFATE HFA 108 (90 BASE) MCG/ACT IN AERS
108 | Freq: Four times a day (QID) | RESPIRATORY_TRACT | 1 refills | Status: DC | PRN
Start: 2023-11-23 — End: 2024-07-12

## 2023-11-23 NOTE — Telephone Encounter (Signed)
The Pt is asking that We CANCEL the request above   She does NOT need a refill, she has plenty.   [phone]

## 2023-11-23 NOTE — Telephone Encounter (Addendum)
Medications Requested:  Requested Prescriptions     Pending Prescriptions Disp Refills    albuterol sulfate HFA (PROVENTIL;VENTOLIN;PROAIR) 108 (90 Base) MCG/ACT inhaler [Pharmacy Med Name: Albuterol Sulfate HFA 108 (90 Base) MCG/ACT Inhalation Aerosol Solution] 18 g 1     Sig: Inhale 2 puffs into the lungs 4 times daily as needed for Wheezing       Preferred Pharmacy:   [facility] [address], [place] - [address] - P [phone] - F [phone]  [address]  [place] [address]  Phone: [phone] Fax: [phone]    Date of Last Refill: 12/13/2022    Prescription Refill Protocol reviewed:YES    Allergy List Reviewed and Verified: YES    Possible medication to medication interactions reviewed: YES    Last appt @ PCP Office: 06/08/2023     Future Appointments   Date Time Provider Department Center   12/26/2023  9:00 AM Harrington Challenger, MD [facility] AMB       MOST RECENT BLOOD PRESSURES  BP Readings from Last 3 Encounters:   06/08/23 130/85   11/29/22 (!) 140/80   04/28/22 137/75         MOST RECENT LAB DATA  Lab Results   Component Value Date/Time    K 4.1 06/09/2023 07:55 AM    ALT 20 06/09/2023 07:55 AM    CHOL 189 06/09/2023 07:55 AM    HGB 12.3 06/09/2023 07:55 AM    HCT 35.6 06/09/2023 07:55 AM       Andres Shad, MA  11/23/23  10:49 AM

## 2023-11-23 NOTE — Telephone Encounter (Signed)
Patient's name and date of birth verified at start of call.   Incoming refill request.  Caller has been notified that we require a minimum of 2 business days for refill processing. (This does not include weekends, holidays, etc.)      Rhonda [name]  03/03/1946      Confirmed best contact number:   Home Phone [phone] (home)    Medications Requested:  Requested Prescriptions     Pending Prescriptions Disp Refills    albuterol sulfate HFA (PROVENTIL;VENTOLIN;PROAIR) 108 (90 Base) MCG/ACT inhaler [Pharmacy Med Name: Albuterol Sulfate HFA 108 (90 Base) MCG/ACT Inhalation Aerosol Solution] 18 g 0     Sig: INHALE 2 PUFFS BY MOUTH 4 TIMES DAILY AS NEEDED FOR WHEEZING       Preferred Pharmacy:  [facility] - [place], [place] - [address] - P [phone] - F [phone] [[id number]]       Has patient already contacted pharmacy to confirm no refills were on file: Yes    Notes for office regarding medication request:          Other instructions and notes:  Last visit with provider: 06/08/2023  Next scheduled visit with provider: 12/26/2023  *If next visit is not scheduled, book next needed visit or document if recall was added to list prior to sending for processing*  Inform patient that this needs to be on file before sending request as we do require for them to remain up-to-date with their recommended healthcare in order to avoid any interruptions in our ability to provide ongoing care such as refills.     Patient MyChart Status:  For Active Patients - Patient has been notified that they will receive an automated notification via MyChart once their script has been processed.   For Inactive Patients - Patient is aware that we have a patient portal, MyChart, which offers many benefits such as being able to request their refills electronically and receive automated notifications when scripts are processed.  In addition, they can schedule and manage appointments, view their testing results and visit notes, and stay  connected with their care team.  Patient offered MyChart today.  Patient .  (Send link for set up if accepted)

## 2023-11-23 NOTE — Telephone Encounter (Signed)
Per patient I will disregard

## 2023-11-23 NOTE — Telephone Encounter (Signed)
The pt is out of te Rx    and she said that she now needs the Rx to go to   [facility], [place] - [address] - P [phone] - F [phone] [[id number]]   As her spouse is in the hospital.\  Please call  [phone]

## 2023-12-01 NOTE — Telephone Encounter (Signed)
Noted

## 2023-12-01 NOTE — Telephone Encounter (Signed)
Name and DOB verified.   Who called? patient  Reason for cancelling? Husband is in the hospital and she said she will call back to reschedule when he is out  Appointment date being canceled: 2/17  Appointment time being canceled: 9  Appointment type (PE, MWV, AWV, acute, office visit, virtual visit, hospital follow up): AWV  Does patient want to be placed on waitlist: no  Was no show policy reviewed? no  Was Virtual Visit offered? (If applicable) no   Please document if Pt declines virtual.

## 2023-12-13 ENCOUNTER — Encounter: Payer: MEDICARE | Attending: Internal Medicine | Primary: Internal Medicine

## 2023-12-14 ENCOUNTER — Encounter: Payer: MEDICARE | Attending: Internal Medicine | Primary: Internal Medicine

## 2023-12-26 ENCOUNTER — Encounter: Payer: MEDICARE | Attending: Internal Medicine | Primary: Internal Medicine

## 2024-01-02 MED ORDER — PANTOPRAZOLE SODIUM 40 MG PO TBEC
40 | ORAL_TABLET | Freq: Every day | ORAL | 3 refills | Status: AC
Start: 2024-01-02 — End: ?

## 2024-01-02 NOTE — Telephone Encounter (Signed)
 Refilled

## 2024-01-02 NOTE — Telephone Encounter (Signed)
 Proton Pump Inhibitor Protocol Failed02/24/2025 08:33 AM    No history of chronic kidney disease (CKD)    Visit with authorizing provider in past 9 months or upcoming 90 days    No positive pregnancy test or active pregnancy on record in past 12 months    No history of osteoporosis       Medications Requested:  Requested Prescriptions     Pending Prescriptions Disp Refills    pantoprazole (PROTONIX) 40 MG tablet [Pharmacy Med Name: Pantoprazole Sodium 40 MG Oral Tablet Delayed Release] 90 tablet 3     Sig: Take 1 tablet by mouth once daily       Preferred Pharmacy:   [facility] [address] [place] - [address] - P [phone] - F [phone]  [address]  [place] [address]  Phone: [phone] Fax: [phone]    [facility] [address], [place] - [address] - P [phone] Carmon Ginsberg [phone]  [address]  [place] [address]  Phone: [phone] Fax: [phone]      Date of Last Refill: 01/17/2023    Prescription Refill Protocol reviewed:YES    Allergy List Reviewed and Verified: YES    Possible medication to medication interactions reviewed: YES    Last appt @ PCP Office: 06/08/2023     No future appointments.    MOST RECENT BLOOD PRESSURES  BP Readings from Last 3 Encounters:   06/08/23 130/85   11/29/22 (!) 140/80   04/28/22 137/75         MOST RECENT LAB DATA  Lab Results   Component Value Date/Time    K 4.1 06/09/2023 07:55 AM    ALT 20 06/09/2023 07:55 AM    CHOL 189 06/09/2023 07:55 AM    HGB 12.3 06/09/2023 07:55 AM    HCT 35.6 06/09/2023 07:55 AM

## 2024-01-31 NOTE — Telephone Encounter (Signed)
 Noted thank you

## 2024-01-31 NOTE — Telephone Encounter (Signed)
 Pt hasn't been seen in a while due to health issues, husband passing and being in [place].  Asked to be seen next week while she is in [place].  Scheduled for an OV on 03/31 at 1500.  Pt just wants to check in with PCP and do a f/u.

## 2024-02-06 ENCOUNTER — Ambulatory Visit: Admit: 2024-02-06 | Discharge: 2024-02-06 | Payer: MEDICARE | Attending: Internal Medicine | Primary: Internal Medicine

## 2024-02-06 VITALS — BP 132/73 | HR 88 | Ht 61.0 in | Wt 163.0 lb

## 2024-02-06 DIAGNOSIS — Z0000 Encounter for general adult medical examination without abnormal findings: Secondary | ICD-10-CM

## 2024-02-06 DIAGNOSIS — J45909 Unspecified asthma, uncomplicated: Secondary | ICD-10-CM

## 2024-02-06 NOTE — Telephone Encounter (Signed)
 Left message to call back, a recent review of our records shows that you may be due for your Annual Wellness or PE appointment. A letter has been sent with scheduled date & time.      Scheduled for 02/20/2024 @2 :00

## 2024-02-06 NOTE — Telephone Encounter (Signed)
 Pt called back and stated that date and time should be ok. Will reach out if she needs to change

## 2024-02-06 NOTE — Progress Notes (Signed)
 [facility]   [address]  [place] [address]    ASSESSMENT AND PLAN     1. Uncomplicated asthma, unspecified asthma severity, unspecified whether persistent      2. GERD without esophagitis      3. Hypertension, benign      4. Preventative health care    - CBC with Auto Differential; Future  - Comprehensive Metabolic Panel; Future  - Lipid Panel; Future    5. Pain in left shin      6. Seborrheic keratosis      7. Recent bereavement       Assessment & Plan  1. Asthma.  Her asthma is stable with the current regimen of albuterol and Advair. She will continue using these medications as needed.    2. Gastroesophageal reflux disease (GERD).  Her GERD symptoms are stable with the use of Protonix. She will continue taking Protonix as prescribed.    3. Hypertension.  Her blood pressure is stable with the current medications, hydrochlorothiazide and lisinopril. She will continue taking these medications as prescribed.    4. Left ankle swelling.  She reports occasional swelling in her left ankle, which is not present today. She is advised to perform morning exercises and stretches to help alleviate the swelling.    5. Muscle cramps.  She experiences muscle cramps at night and uses Theraworx as a muscle relaxer. She is advised to continue using Theraworx and may take ibuprofen as needed for pain relief.    6. Seborrheic keratosis.  A spot on her chest has been identified as seborrheic keratosis. It is benign and will be monitored for any changes. A picture of the spot was taken for future reference.    7.Recent bereavement  [name] recently lost her husband. She has occasional nights of anxiety and sleep disturbance.Her family is close for emotional support. Discussed support services if needed but she wishes to be with her family for now.     Follow-up  The patient will follow up next month.  Total Time spent in care of patient _30_ Minutes.   CHIEF COMPLAINT   Rhonda [name] is a 78 y.o. female who  presents to clinic for   Chief Complaint   Patient presents with    Follow-up        HPI     History of Present Illness  The patient is here for a follow-up visit. Asthma is stable on albuterol and Advair. GERD symptoms are stable on Protonix. Blood pressure is stable on hydrochlorothiazide and lisinopril.    She reports overall good health, with no complaints of chest or abdominal pain. Her respiratory function remains satisfactory.    She recently lost her husband of 50 years. She has experienced weight loss, currently weighing 163 pounds. She has been residing with her sons since August 2024 but relocated last week. She reports good sleep quality without the need for any sleep aids except on some nights.     She notes frequent swelling in her left ankle, which is not severe today but varies in intensity. She has been less active recently, spending significant time seated in the hospital. There is no redness or soreness associated with the ankle swelling.    She experiences muscle cramps at night, predominantly affecting the anterior aspect of her leg, which occasionally disrupts her sleep. She uses Theraworx, a muscle relaxer, and sometimes resorts to ibuprofen for pain management.    She has a spot on her chest  that has remained unchanged in size for a considerable period. It is not associated with any pain or bleeding.    MEDICATIONS  Current: Albuterol, Advair, Protonix, hydrochlorothiazide, lisinopril, Theraworx.         Current Outpatient Medications:     pantoprazole (PROTONIX) 40 MG tablet, Take 1 tablet by mouth once daily, Disp: 90 tablet, Rfl: 3    albuterol sulfate HFA (PROVENTIL;VENTOLIN;PROAIR) 108 (90 Base) MCG/ACT inhaler, Inhale 2 puffs into the lungs 4 times daily as needed for Wheezing, Disp: 18 g, Rfl: 1    lisinopril (PRINIVIL;ZESTRIL) 40 MG tablet, Take 2 tablets by mouth once daily, Disp: 180 tablet, Rfl: 3    hydroCHLOROthiazide (HYDRODIURIL) 25 MG tablet, Take 1 tablet by mouth once daily,  Disp: 90 tablet, Rfl: 3    meloxicam (MOBIC) 15 MG tablet, TAKE 1/2 (ONE-HALF) TABLET BY MOUTH IN THE MORNING AND AT BEDTIME, Disp: 90 tablet, Rfl: 3    fluticasone-salmeterol (ADVAIR) 250-50 MCG/ACT AEPB diskus inhaler, INHALE 1 DOSE BY MOUTH TWICE DAILY, Disp: 180 each, Rfl: 3    Multiple Vitamins-Minerals (WOMENS DAILY FORMULA) TABS, Take 1 tablet by mouth daily, Disp: , Rfl:     turmeric 500 MG CAPS, Take 1 capsule by mouth daily Gummy chewable. 2 per day, Disp: , Rfl:      Active Problem List, Past Medical, Surgical, Family, Social History and Allergies reviewed and updated.        REVIEW OF SYSTEMS   Review of Systems   Constitutional:  Negative for chills and fever.   HENT:  Negative for congestion and sore throat.    Eyes:  Negative for photophobia.   Respiratory:  Negative for cough, shortness of breath and wheezing.    Cardiovascular:  Negative for chest pain, palpitations and leg swelling.   Gastrointestinal:  Negative for abdominal pain and diarrhea.   Genitourinary:  Negative for dysuria.   Musculoskeletal:  Negative for back pain.   Neurological:  Negative for dizziness and weakness.   Psychiatric/Behavioral:  Positive for sleep disturbance. Negative for confusion. The patient is nervous/anxious.         PHYSICAL EXAM   BP 132/73 (BP Site: Right Upper Arm, Patient Position: Sitting, BP Cuff Size: Medium Adult)   Pulse 88   Ht 1.549 m (5\' 1" )   Wt 73.9 kg (163 lb)   SpO2 97%   BMI 30.80 kg/m   Physical Exam  Lungs were auscultated.  Heart was examined.  Seborrheic keratosis noted on the skin of the chest.    Vital Signs  Weight is 163 pounds.      Physical Exam  Vitals and nursing note reviewed.   Constitutional:       Appearance: Normal appearance.   Eyes:      General: No scleral icterus.     Conjunctiva/sclera: Conjunctivae normal.   Cardiovascular:      Rate and Rhythm: Normal rate and regular rhythm.      Heart sounds: Normal heart sounds.   Pulmonary:      Breath sounds: Normal breath  sounds. No wheezing or rhonchi.   Abdominal:      Palpations: Abdomen is soft.      Tenderness: There is no abdominal tenderness.   Musculoskeletal:      Right lower leg: No edema.      Left lower leg: No edema.      Comments: Left ankle:no swelling or redness. No tenderness over the medial/lateral mammeolus   Normal ROM.   Neurological:  General: No focal deficit present.      Mental Status: She is alert and oriented to person, place, and time. Mental status is at baseline.          LABS/IMAGING     Results       No results found for any visits on 02/06/24.      The patient (or guardian, if applicable) and other individuals in attendance with the patient were advised that Artificial Intelligence will be utilized during this visit to record, process the conversation to generate a clinical note, and support improvement of the AI technology. The patient (or guardian, if applicable) and other individuals in attendance at the appointment consented to the use of AI, including the recording.

## 2024-02-10 ENCOUNTER — Inpatient Hospital Stay: Admit: 2024-02-10 | Payer: MEDICARE | Primary: Internal Medicine

## 2024-02-10 DIAGNOSIS — Z0000 Encounter for general adult medical examination without abnormal findings: Secondary | ICD-10-CM

## 2024-02-10 DIAGNOSIS — I10 Essential (primary) hypertension: Secondary | ICD-10-CM

## 2024-02-10 LAB — CBC WITH AUTO DIFFERENTIAL
Basophils %: 0.6 %
Basophils Absolute: 0.05 10*3/uL (ref 0.0–0.2)
Eosinophils %: 1 %
Eosinophils Absolute: 0.09 10*3/uL (ref 0.0–0.5)
Hematocrit: 34.7 % — ABNORMAL LOW (ref 36.0–47.0)
Hemoglobin: 11.9 g/dL — ABNORMAL LOW (ref 12.0–16.0)
Immature Granulocytes %: 0.3 %
Immature Granulocytes Absolute: 0.03 10*3/uL (ref 0.00–0.06)
Lymphocytes Absolute: 1.93 10*3/uL (ref 1.0–4.5)
Lymphocytes: 22.4 %
MCH: 31.6 pg (ref 28.0–34.0)
MCHC: 34.3 g/dL (ref 32.0–36.0)
MCV: 92 FL (ref 80.0–100.0)
MPV: 9.4 FL (ref 7.0–12.0)
Monocytes %: 6.7 %
Monocytes Absolute: 0.58 10*3/uL (ref 0.1–0.8)
Neutrophils Absolute: 5.95 10*3/uL (ref 1.9–7.8)
Nucleated RBCs: 0 /100{WBCs}
Platelets: 347 10*3/uL (ref 150–400)
RBC: 3.77 M/uL — ABNORMAL LOW (ref 4.20–5.40)
RDW: 12.1 % (ref 11.5–13.5)
Seg Neutrophils: 69 %
WBC: 8.6 10*3/uL (ref 4.8–10.8)
nRBC: 0 10*3/uL

## 2024-02-10 LAB — LIPID PANEL
Cholesterol, Total: 159 mg/dL (ref 155–199)
HDL: 46 mg/dL (ref 40–91)
LDL Cholesterol: 99.4 mg/dL (ref 70–129)
Non-HDL Cholesterol: 113 mg/dL (ref <150)
Triglycerides: 68 mg/dL (ref 50–149)

## 2024-02-10 LAB — COMPREHENSIVE METABOLIC PANEL
ALT: 19 U/L (ref 3–35)
AST: 24 U/L (ref 15–40)
Albumin/Globulin Ratio: 1.7
Albumin: 3.8 g/dL (ref 3.5–5.7)
Alk Phosphatase: 58 U/L (ref 34–104)
Anion Gap: 5 mmol/L (ref 3–16)
BUN/Creatinine Ratio: 20
BUN: 27 mg/dL — ABNORMAL HIGH (ref 7–25)
CO2: 32 mmol/L (ref 20–32)
Calcium: 9.6 mg/dL (ref 8.6–10.3)
Chloride: 102 mmol/L (ref 98–107)
Creatinine: 1.36 mg/dL — ABNORMAL HIGH (ref 0.6–1.2)
Est, Glom Filt Rate: 40 mL/min/{1.73_m2}
Globulin: 2.2 g/dL
Glucose: 88 mg/dL (ref 70–99)
Potassium: 4.7 mmol/L — ABNORMAL HIGH (ref 3.4–4.5)
Sodium: 139 mmol/L (ref 136–145)
Total Bilirubin: 0.4 mg/dL (ref 0.1–1.2)
Total Protein: 6 g/dL (ref 6.0–8.3)

## 2024-02-10 NOTE — Telephone Encounter (Signed)
 Pt has upcoming appt with PCP 4/14. Appt notes updated to review labs at OV

## 2024-02-15 MED ORDER — MELOXICAM 15 MG PO TABS
15 | ORAL_TABLET | ORAL | 0 refills | Status: AC
Start: 2024-02-15 — End: ?

## 2024-02-15 NOTE — Telephone Encounter (Signed)
 Last creatinine  Out of range at 1.36 on 02/10/24. Unable to sign prescription.     "Medication: Meloxicam  Last Office Visit: OV within 1 year  Lab Monitoring: Creatinine Annually  Category: COX-2 Inhibitor  Length of refill: 1 year  Brand Name: No  Comments:"    Medications Requested:  Requested Prescriptions     Pending Prescriptions Disp Refills    meloxicam (MOBIC) 15 MG tablet [Pharmacy Med Name: Meloxicam 15 MG Oral Tablet] 90 tablet 0     Sig: TAKE 1/2 (ONE-HALF) TABLET BY MOUTH IN THE MORNING AND 1/2  TABLET AT BEDTIME       Preferred Pharmacy:   [facility] [address] [place] - [address] - P [phone] - F [phone]  [address]  [place] [address]  Phone: [phone] Fax: [phone]    [facility] [address], [place] - [address] - P [phone] Carmon Ginsberg [phone]  [address]  [place] [address]  Phone: [phone] Fax: [phone]      Date of Last Refill: 02/25/23    Prescription Refill Protocol reviewed:YES    Allergy List Reviewed and Verified: YES    Possible medication to medication interactions reviewed: YES    Last appt @ PCP Office: 02/06/2024     Future Appointments   Date Time Provider Department Center   02/20/2024  2:00 PM Harrington Challenger, MD [facility]       MOST RECENT BLOOD PRESSURES  BP Readings from Last 3 Encounters:   02/06/24 132/73   06/08/23 130/85   11/29/22 (!) 140/80         MOST RECENT LAB DATA  Lab Results   Component Value Date/Time    K 4.7 02/10/2024 08:34 AM    ALT 19 02/10/2024 08:34 AM    CHOL 159 02/10/2024 08:34 AM    HGB 11.9 02/10/2024 08:34 AM    HCT 34.7 02/10/2024 08:34 AM

## 2024-02-15 NOTE — Telephone Encounter (Signed)
 Refilled

## 2024-02-20 ENCOUNTER — Ambulatory Visit: Admit: 2024-02-20 | Discharge: 2024-02-20 | Payer: MEDICARE | Attending: Internal Medicine | Primary: Internal Medicine

## 2024-02-20 VITALS — BP 140/85 | HR 89 | Ht 63.9 in | Wt 161.8 lb

## 2024-02-20 DIAGNOSIS — Z0000 Encounter for general adult medical examination without abnormal findings: Secondary | ICD-10-CM

## 2024-02-20 NOTE — Progress Notes (Signed)
 Medicare Annual Wellness Visit    Rhonda [name] is here for Annual Exam and Medicare AWV (Pt states she has a head cold but has not gone away. )    Assessment & Plan  1. Medicare wellness visit.  Her cholesterol levels are commendable, however, there is a slight decrease in kidney function, potentially due to dehydration or the use of hydrochlorothiazide. Blood pressure readings have been consistently above 142. She is due for a mammogram this month. The upper respiratory tract, including lungs, throat, and sinuses, are in good condition. She is advised to maintain adequate hydration and keep herself warm. A re-evaluation of blood pressure will be conducted in the upcoming week. A kidney function test will be ordered to be completed within the next 2 weeks. A BMP will also be ordered. The current regimen of hydrochlorothiazide will be continued without any modifications. She is advised to stay active and keep herself warm.    2. Asthma.  Her asthma is well controlled with albuterol and Advair. She should continue using these medications as prescribed.    3. Gastroesophageal reflux disease.  She is taking Protonix as needed for GERD. No changes to this regimen are necessary at this time.    4. Hypertension.  Her blood pressure is slightly elevated at 140/85. She is currently on hydrochlorothiazide and lisinopril. No changes to her medication regimen are necessary at this time. Blood pressure will be rechecked in the next week.    5. Sleep apnea.  Her sleep is stable with the use of a CPAP machine. No changes are necessary at this time.    Follow-up  The patient will follow up in 6 months.    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    Medicare annual wellness visit, subsequent  Moderate persistent asthma without complication  Obstructive sleep apnea syndrome  GERD without esophagitis  Preventative health care  Hypertension, benign  -     Basic Metabolic Panel;  Future  Viral upper respiratory tract infection    Results  Laboratory Studies  Cholesterol levels are excellent. Kidney function is slightly decreased at 1.36.       Return in about 6 months (around 08/21/2024) for OV 30.  Total Time spent in care of patient 30 Minutes.      Subjective     History of Present Illness  The patient is here for a Medicare well visit.    She reports a persistent cold that has been ongoing for 2 weeks, which has been disrupting her sleep. She has been using a double pillow to alleviate her symptoms, which she finds somewhat helpful. She does not believe an x-ray is necessary at this time. She is currently utilizing inhalers and a CPAP machine. She expresses uncertainty about a potential allergy to prednisone. She reports no abdominal pain and notes an improvement in her ankle condition.    Her asthma is well controlled with albuterol and Advair.    For GERD, she is taking Protonix as needed.    For blood pressure, she is on hydrochlorothiazide and lisinopril.    Her sleep is stable on CPAP.    She had a blood test since her last visit. She is due for a mammogram this month. Her last colonoscopy was up to date on 08/23/2016, and the next one is due in 2027.    Supplemental Information  She has a history of left knee joint replacement and is  doing well.    ALLERGIES  The patient has an unknown allergy to PREDNISONE.    MEDICATIONS  Current: Albuterol, Advair, Protonix (as needed), hydrochlorothiazide, lisinopril      Patient's complete Health Risk Assessment and screening values have been reviewed and are found in Flowsheets. The following problems were reviewed today and where indicated follow up appointments were made and/or referrals ordered.    No Positive Risk Factors identified today.                                    Objective   Vitals:    02/20/24 1410 02/20/24 1425   BP: (!) 142/60 (!) 140/85   BP Site: Right Upper Arm    Patient Position: Sitting    BP Cuff Size: Medium Adult     Pulse: 89    SpO2: 97%    Weight: 73.4 kg (161 lb 12.8 oz)    Height: 1.623 m (5' 3.9")       Body mass index is 27.86 kg/m.        Physical Exam  Oral exam was performed.  Lungs are clear.    Vital Signs  Blood pressure reading is 140/85.       Physical Exam  Vitals and nursing note reviewed.   Constitutional:       Appearance: Normal appearance.   HENT:      Head:      Comments: -ve sinus tenderness bilaterally.     Right Ear: Tympanic membrane, ear canal and external ear normal.      Left Ear: Tympanic membrane, ear canal and external ear normal.      Mouth/Throat:      Pharynx: Oropharynx is clear. No oropharyngeal exudate or posterior oropharyngeal erythema.   Eyes:      General: No scleral icterus.     Conjunctiva/sclera: Conjunctivae normal.   Neck:      Vascular: No carotid bruit.   Cardiovascular:      Rate and Rhythm: Normal rate and regular rhythm.      Heart sounds: Normal heart sounds. No murmur heard.     No friction rub. No gallop.   Pulmonary:      Breath sounds: Normal breath sounds. No wheezing or rhonchi.   Abdominal:      General: Bowel sounds are normal.      Palpations: Abdomen is soft.      Tenderness: There is no abdominal tenderness. There is no guarding or rebound.   Musculoskeletal:      Cervical back: Neck supple.      Right lower leg: No edema.      Left lower leg: No edema.   Lymphadenopathy:      Cervical: No cervical adenopathy.   Skin:     Findings: No rash.   Neurological:      General: No focal deficit present.      Mental Status: She is alert and oriented to person, place, and time.      Cranial Nerves: No cranial nerve deficit.      Sensory: No sensory deficit.      Motor: No weakness.      Coordination: Coordination normal.      Gait: Gait normal.      Deep Tendon Reflexes: Reflexes normal.      Review of Systems   Constitutional:  Negative for chills, fatigue and fever.   HENT:  Negative for congestion, ear pain, sinus pain and sore throat.    Eyes:  Negative for photophobia  and redness.   Respiratory:  Negative for cough, chest tightness, shortness of breath and wheezing.    Cardiovascular:  Negative for chest pain, palpitations and leg swelling.   Gastrointestinal:  Negative for abdominal pain and diarrhea.   Genitourinary:  Negative for dysuria and urgency.   Musculoskeletal:  Negative for back pain.   Neurological:  Negative for dizziness.   Psychiatric/Behavioral:  The patient is not nervous/anxious.            Allergies   Allergen Reactions    Cefaclor      Other reaction(s): Not Reported This Time    Methylprednisolone Other (See Comments)     Pt states that she has a reaction     Prednisone      Other reaction(s): Not Reported This Time     Prior to Visit Medications    Medication Sig Taking? Authorizing Provider   meloxicam (MOBIC) 15 MG tablet TAKE 1/2 (ONE-HALF) TABLET BY MOUTH IN THE MORNING AND 1/2  TABLET AT BEDTIME Yes Venessa Gilbert, MD   pantoprazole (PROTONIX) 40 MG tablet Take 1 tablet by mouth once daily Yes Amreen Raczkowski, Linder Revere, MD   albuterol sulfate HFA (PROVENTIL;VENTOLIN;PROAIR) 108 (90 Base) MCG/ACT inhaler Inhale 2 puffs into the lungs 4 times daily as needed for Wheezing Yes Venessa Gilbert, MD   lisinopril (PRINIVIL;ZESTRIL) 40 MG tablet Take 2 tablets by mouth once daily Yes Laurella Tull P, MD   hydroCHLOROthiazide (HYDRODIURIL) 25 MG tablet Take 1 tablet by mouth once daily Yes German Koller, Linder Revere, MD   fluticasone-salmeterol (ADVAIR) 250-50 MCG/ACT AEPB diskus inhaler INHALE 1 DOSE BY MOUTH TWICE DAILY Yes German Koller, Linder Revere, MD   Multiple Vitamins-Minerals (WOMENS DAILY FORMULA) TABS Take 1 tablet by mouth daily Yes [provider]       CareTeam (Including outside providers/suppliers regularly involved in providing care):   Patient Care Team:  Venessa Gilbert, MD as PCP - General     Recommendations for Preventive Services Due: see orders and patient instructions/AVS.  Recommended screening schedule for the next 5-10 years is provided to the  patient in written form: see Patient Instructions/AVS.     Reviewed and updated this visit:  Tobacco  Allergies  Meds  Problems  Med Hx  Surg Hx  Fam Hx  Sexual   Hx                  The patient (or guardian, if applicable) and other individuals in attendance with the patient were advised that Artificial Intelligence will be utilized during this visit to record and process the conversation to generate a clinical note. The patient (or guardian, if applicable) and other individuals in attendance at the appointment consented to the use of AI, including the recording.

## 2024-02-20 NOTE — Patient Instructions (Signed)
 A Healthy Heart: Care Instructions  Overview     Coronary artery disease, also called heart disease, occurs when a substance called plaque builds up in the vessels that supply oxygen-rich blood to your heart muscle. This can narrow the blood vessels and reduce blood flow. A heart attack happens when blood flow is completely blocked. A high-fat diet, smoking, and other factors increase the risk of heart disease.  Your doctor has found that you have a chance of having heart disease. A heart-healthy lifestyle can help keep your heart healthy and prevent heart disease. This lifestyle includes eating healthy, being active, staying at a weight that's healthy for you, and not smoking or using tobacco. It also includes taking medicines as directed, managing other health conditions, and trying to get a healthy amount of sleep.  Follow-up care is a key part of your treatment and safety. Be sure to make and go to all appointments, and call your doctor if you are having problems. It's also a good idea to know your test results and keep a list of the medicines you take.  How can you care for yourself at home?  Diet    Use less salt when you cook and eat. This helps lower your blood pressure. Taste food before salting. Add only a little salt when you think you need it. With time, your taste buds will adjust to less salt.     Eat fewer snack items, fast foods, canned soups, and other high-salt, high-fat, processed foods.     Read food labels and try to avoid saturated and trans fats. They increase your risk of heart disease by raising cholesterol levels.     Limit the amount of solid fat--butter, margarine, and shortening--you eat. Use olive, peanut, or canola oil when you cook. Bake, broil, and steam foods instead of frying them.     Eat a variety of fruit and vegetables every day. Dark green, deep orange, red, or yellow fruits and vegetables are especially good for you. Examples include spinach, carrots, peaches, and  berries.     Foods high in fiber can reduce your cholesterol and provide important vitamins and minerals. High-fiber foods include whole-grain cereals and breads, oatmeal, beans, Selvage rice, citrus fruits, and apples.     Eat lean proteins. Heart-healthy proteins include seafood, lean meats and poultry, eggs, beans, peas, nuts, seeds, and soy products.     Limit drinks and foods with added sugar. These include candy, desserts, and soda pop.   Heart-healthy lifestyle    If your doctor recommends it, get more exercise. For many people, walking is a good choice. Or you may want to swim, bike, or do other activities. Bit by bit, increase the time you're active every day. Try for at least 30 minutes on most days of the week.     Try to quit or cut back on using tobacco and other nicotine products. This includes smoking and vaping. If you need help quitting, talk to your doctor about stop-smoking programs and medicines. These can increase your chances of quitting for good. Quitting is one of the most important things you can do to protect your heart. It is never too late to quit. Try to avoid secondhand smoke too.     Stay at a weight that's healthy for you. Talk to your doctor if you need help losing weight.     Try to get 7 to 9 hours of sleep each night.     Limit alcohol to 2  drinks a day for men and 1 drink a day for women. Too much alcohol can cause health problems.     Manage other health problems such as diabetes, high blood pressure, and high cholesterol. If you think you may have a problem with alcohol or drug use, talk to your doctor.   Medicines    Take your medicines exactly as prescribed. Call your doctor if you think you are having a problem with your medicine.     If your doctor recommends aspirin, take the amount directed each day. Make sure you take aspirin and not another kind of pain reliever, such as acetaminophen (Tylenol).   When should you call for help?   Call 911 if you have symptoms of a heart  attack. These may include:    Chest pain or pressure, or a strange feeling in the chest.     Sweating.     Shortness of breath.     Pain, pressure, or a strange feeling in the back, neck, jaw, or upper belly or in one or both shoulders or arms.     Lightheadedness or sudden weakness.     A fast or irregular heartbeat.   After you call 911, the operator may tell you to chew 1 adult-strength or 2 to 4 low-dose aspirin. Wait for an ambulance. Do not try to drive yourself.  Watch closely for changes in your health, and be sure to contact your doctor if you have any problems.  Where can you learn more?  Go to [employer] and enter F075 to learn more about "A Healthy Heart: Care Instructions."  Current as of: June 08, 2023  Content Version: 14.4   2024-2025 [place], [place].   Care instructions adapted under license by [facility]. If you have questions about a medical condition or this instruction, always ask your healthcare professional. Larene [place], [facility], disclaims any warranty or liability for your use of this information.    Personalized Preventive Plan for Rhonda [name] - 02/20/2024  Medicare offers a range of preventive health benefits. Some of the tests and screenings are paid in full while other may be subject to a deductible, co-insurance, and/or copay.  Some of these benefits include a comprehensive review of your medical history including lifestyle, illnesses that may run in your family, and various assessments and screenings as appropriate.  After reviewing your medical record and screening and assessments performed today your provider may have ordered immunizations, labs, imaging, and/or referrals for you.  A list of these orders (if applicable) as well as your Preventive Care list are included within your After Visit Summary for your review.

## 2024-02-27 ENCOUNTER — Encounter

## 2024-03-08 NOTE — Telephone Encounter (Signed)
 Pt called back just to double check her refill had been called over by pharmacy. I let her know it had.    [phone]

## 2024-03-09 MED ORDER — FLUTICASONE-SALMETEROL 250-50 MCG/ACT IN AEPB
250-50 | RESPIRATORY_TRACT | 3 refills | 75.00000 days | Status: AC
Start: 2024-03-09 — End: ?

## 2024-03-09 NOTE — Telephone Encounter (Signed)
 Medications Requested:  Requested Prescriptions     Pending Prescriptions Disp Refills    fluticasone -salmeterol (ADVAIR ) 250-50 MCG/ACT AEPB diskus inhaler [Pharmacy Med Name: Fluticasone -Salmeterol 250-50 MCG/DOSE Inhalation Aerosol Powder Breath Activated] 180 each 0     Sig: INHALE 1 DOSE BY MOUTH TWICE DAILY       Preferred Pharmacy:   [facility] [address] [place] - [address] - P [phone] - F [phone]  [address]  [place] [address]  Phone: [phone] Fax: [phone]    [facility] [address] [place] - [address] - P [phone] Kaylene Pascal [phone]  [address]  [place] [address]  Phone: [phone] Fax: [phone]      Date of Last Refill: 01/19/23 180 each 3 ref    Prescription Refill Protocol reviewed: Yes   Name from pharmacy: Fluticasone -Salmeterol 250-50 MCG/DOSE Inhalation Aerosol Powder Breath Activated         Will file in chart as: fluticasone -salmeterol (ADVAIR ) 250-50 MCG/ACT AEPB diskus inhaler    Sig: INHALE 1 DOSE BY MOUTH TWICE DAILY    Disp: 180 each    Refills: 0    Start: 03/08/2024    Class: Normal    Non-formulary    Last ordered: 1 year ago (01/19/2023) by Venessa Gilbert, MD    Last refill: 10/13/2023    Rx #: [id number]    Maintenance Inhalers - Long-Acting Beta Agonists & Combinations Protocol Passed05/11/2023 10:38 AM    Visit with authorizing provider in past 9 months or upcoming 90 days      Refill protocol passed    Allergy List Reviewed and Verified: Yes    Possible medication to medication interactions reviewed: Yes    Last appt @ PCP Office: 02/20/2024     Future Appointments   Date Time Provider Department Center   08/28/2024 12:30 PM Venessa Gilbert, MD [facility]   02/21/2025 12:30 PM Venessa Gilbert, MD [facility]       MOST RECENT BLOOD PRESSURES  BP Readings from Last 3 Encounters:   02/20/24 (!) 140/85   02/06/24 132/73   06/08/23 130/85         MOST RECENT LAB DATA  Lab Results   Component Value Date/Time    K 4.7 02/10/2024 08:34  AM    ALT 19 02/10/2024 08:34 AM    CHOL 159 02/10/2024 08:34 AM    HGB 11.9 02/10/2024 08:34 AM    HCT 34.7 02/10/2024 08:34 AM

## 2024-05-17 NOTE — Telephone Encounter (Signed)
 Medication: Meloxicam   Last Office Visit: OV within 1 year  Lab Monitoring: Creatinine Annually  Category: COX-2 Inhibitor  Length of refill: 1 year  Brand Name: No  Comments:  Date of Last Refill: 02/15/2024     Prescription Refill Protocol reviewed: yes, unable to sign, creatinine in April was elevated. Routing to covering provider for Dr.Pandey today. Thank you    Signed by Damien CHRISTELLA Dross, MA

## 2024-05-17 NOTE — Telephone Encounter (Signed)
 Please call patient.  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  I am not comfortable refilling it for her because of the abnormal kidney function. I recommend waiting till Dr. Arnetha returns next week to see what he advises since he knows her history.

## 2024-05-17 NOTE — Telephone Encounter (Signed)
 Medications Requested:  Requested Prescriptions     Pending Prescriptions Disp Refills    meloxicam  (MOBIC ) 15 MG tablet [Pharmacy Med Name: Meloxicam  15 MG Oral Tablet] 90 tablet 0     Sig: TAKE 1/2 (ONE-HALF) TABLET BY MOUTH IN THE MORNING AND 1/2 (ONE-HALF) AT BEDTIME       Preferred Pharmacy:   [facility] [address], [place] - [address] - P [phone] - F [phone]  [address]  [place] [address]  Phone: [phone] Fax: [phone]    [facility] [address] [place] - [address] - P [phone] [place] [phone]  [address]  [place] [address]  Phone: [phone] Fax: [phone]     Name from pharmacy: Meloxicam  15 MG Oral Tablet         Will file in chart as: meloxicam  (MOBIC ) 15 MG tablet    Sig: TAKE 1/2 (ONE-HALF) TABLET BY MOUTH IN THE MORNING AND 1/2 (ONE-HALF) AT BEDTIME    Disp: 90 tablet    Refills: 0    Start: 05/17/2024    Class: Normal    Non-formulary    Last ordered: 3 months ago (02/15/2024) by Ripley SHAUNNA Cliche, MD    Last refill: 02/17/2024    Rx #: [id number]       To be filled at: [facility] [address] [facility], [place] - [address] - P [phone] - F [phone]      Medication: Meloxicam   Last Office Visit: OV within 1 year  Lab Monitoring: Creatinine Annually  Category: COX-2 Inhibitor  Length of refill: 1 year  Brand Name: No  Comments:  Date of Last Refill: 02/15/2024    Prescription Refill Protocol reviewed: yes, unable to sign, creatinine in April was elevated. Routing to covering provider for Dr.Pandey today. Thank you    Signed by Damien CHRISTELLA Dross, MA     Allergy List Reviewed and Verified: Yes    Possible medication to medication interactions reviewed: Yes    Last appt @ PCP Office: 02/20/2024     Future Appointments   Date Time Provider Department Center   08/28/2024 12:30 PM Cliche Ripley SHAUNNA, MD [facility]   02/21/2025 12:30 PM Cliche Ripley SHAUNNA, MD [facility]       MOST RECENT BLOOD PRESSURES  BP Readings from Last 3 Encounters:   02/20/24 (!)  140/85   02/06/24 132/73   06/08/23 130/85         MOST RECENT LAB DATA  Lab Results   Component Value Date/Time    K 4.7 02/10/2024 08:34 AM    ALT 19 02/10/2024 08:34 AM    CHOL 159 02/10/2024 08:34 AM    HGB 11.9 02/10/2024 08:34 AM    HCT 34.7 02/10/2024 08:34 AM

## 2024-05-18 NOTE — Telephone Encounter (Signed)
 Portal to pt - covering deferring to PCP for when he is back in the office next week. To PCP to advise on pt meloxicam  and most recent kidney functions.           Karna FALCON Havelock, MA  05/18/24  4:47 AM

## 2024-05-22 NOTE — Telephone Encounter (Signed)
 Patient is aware

## 2024-05-22 NOTE — Telephone Encounter (Signed)
 Please ask her to avoid meloxicam  if she can. Opt for tylenol extra strength 650 mg as needed for pain if possible.

## 2024-06-13 MED ORDER — HYDROCHLOROTHIAZIDE 25 MG PO TABS
25 | ORAL_TABLET | Freq: Every day | ORAL | 0 refills | 90.00000 days | Status: DC
Start: 2024-06-13 — End: 2024-09-11

## 2024-06-13 NOTE — Telephone Encounter (Signed)
 Refilled

## 2024-06-13 NOTE — Telephone Encounter (Signed)
 Diuretics Protocol Failed08/03/2024 09:44 AM   Protocol Details Last potassium level normal, within the past 12 months    Last creatinine level resulted within the past 12 months    Last sodium level normal, within the past 12 months    Visit with authorizing provider in past 9 months or upcoming 90 days                                        Medications Requested:  Requested Prescriptions     Pending Prescriptions Disp Refills    hydroCHLOROthiazide  (HYDRODIURIL ) 25 MG tablet [Pharmacy Med Name: hydroCHLOROthiazide  25 MG Oral Tablet] 90 tablet 0     Sig: Take 1 tablet by mouth once daily       Preferred Pharmacy:   [facility] [address] [place] - [address] - P [phone] - F [phone]  [address]  [place] [address]  Phone: [phone] Fax: [phone]    [facility] [address] [place] - [address] - P [phone] [place] F[place]CON [phone]  [address]  [place] [address]  Phone: [phone] Fax: [phone]      Date of Last Refill: 06/14/2023    Prescription Refill Protocol reviewed:YES    Allergy List Reviewed and Verified: YES    Possible medication to medication interactions reviewed: YES    Last appt @ PCP Office: 02/20/2024     Future Appointments   Date Time Provider Department Center   08/28/2024 12:30 PM Arnetha Ripley SQUIBB, MD [facility]   02/21/2025 12:30 PM Arnetha Ripley SQUIBB, MD [facility]       MOST RECENT BLOOD PRESSURES  BP Readings from Last 3 Encounters:   02/20/24 (!) 140/85   02/06/24 132/73   06/08/23 130/85         MOST RECENT LAB DATA  Lab Results   Component Value Date/Time    K 4.7 02/10/2024 08:34 AM    ALT 19 02/10/2024 08:34 AM    CHOL 159 02/10/2024 08:34 AM    HGB 11.9 02/10/2024 08:34 AM    HCT 34.7 02/10/2024 08:34 AM

## 2024-07-04 MED ORDER — BACLOFEN 10 MG PO TABS
10 | ORAL_TABLET | Freq: Three times a day (TID) | ORAL | 0 refills | Status: AC
Start: 2024-07-04 — End: 2024-07-14

## 2024-07-04 NOTE — Telephone Encounter (Signed)
 I called the patient and relayed the message below. Patient expressed understanding.

## 2024-07-04 NOTE — Telephone Encounter (Signed)
 I called the patient and relayed the message below. Patient stated she uses [facility] in [place].

## 2024-07-04 NOTE — Telephone Encounter (Signed)
 Please let her know she really needs to see someone for a clinical evaluation. It sounds like she is having some muscle spasm. Please ask her if she would like to try some muscle relaxants and see how she does over the weekend?

## 2024-07-04 NOTE — Telephone Encounter (Signed)
 Pt states she is having problems with her back- hipline, right side- hurts when she is walking. Pt states it has been 3-4 days. Fine when sitting or laying down. No known injuries     Pt states she has moved to [place] area and has not been able to find a PCP. She would like to know if Dr. Arnetha can do VV for this.     Please advise Pt

## 2024-07-04 NOTE — Telephone Encounter (Signed)
 Where is her pharmacy in [place]?

## 2024-07-04 NOTE — Telephone Encounter (Signed)
 Script sent. Please ask her not to drive immediately after taking this medication.

## 2024-07-04 NOTE — Telephone Encounter (Signed)
 Spoke with pt she said yes to muscle relaxer. She would like to try this first and if doesn't help she will come in.

## 2024-07-12 MED ORDER — ALBUTEROL SULFATE HFA 108 (90 BASE) MCG/ACT IN AERS
108 | RESPIRATORY_TRACT | 3 refills | Status: AC | PRN
Start: 2024-07-12 — End: ?

## 2024-07-12 NOTE — Telephone Encounter (Signed)
 Short Acting Inhaled Beta-Agonists Protocol Passed09/02/2024 01:39 PM    Visit with authorizing provider in past 9 months or upcoming 90 days       To be filled at: [facility] [address] [place] - [address] - P [phone] [place] [phone]             Date of Last Refill: 11/23/2023     Prescription Refill Protocol reviewed: Yes  Signed by Damien CHRISTELLA Dross, MA

## 2024-07-12 NOTE — Telephone Encounter (Signed)
 Medications Requested:  Requested Prescriptions     Pending Prescriptions Disp Refills    albuterol  sulfate HFA (PROVENTIL ;VENTOLIN ;PROAIR ) 108 (90 Base) MCG/ACT inhaler [Pharmacy Med Name: Albuterol  Sulfate HFA 108 (90 Base) MCG/ACT Inhalation Aerosol Solution] 18 g 0     Sig: Inhale 2 puffs into the lungs every 4 hours as needed for Wheezing       Preferred Pharmacy:   [facility] [address], [place] - [address] - P [phone] - F [phone]  [address]  [place] [address]  Phone: [phone] Fax: [phone]    [facility] [address] [place] - [address] - P [phone] [place] [phone]  [address]  [place] [address]  Phone: [phone] Fax: [phone]     Name from pharmacy: Albuterol  Sulfate HFA 108 (90 Base) MCG/ACT Inhalation Aerosol Solution         Will file in chart as: albuterol  sulfate HFA (PROVENTIL ;VENTOLIN ;PROAIR ) 108 (90 Base) MCG/ACT inhaler    Sig: Inhale 2 puffs into the lungs every 4 hours as needed for Wheezing    Original sig: INHALE 2 PUFFS BY MOUTH 4 TIMES DAILY AS NEEDED FOR WHEEZING    Disp: 18 g    Refills: 0    Start: 07/12/2024    Class: Normal    Last ordered: 7 months ago (11/23/2023) by Ripley SHAUNNA Cliche, MD    Last refill: 03/08/2024    Rx #: [id number]    Short Acting Inhaled Beta-Agonists Protocol Passed09/02/2024 01:39 PM    Visit with authorizing provider in past 9 months or upcoming 90 days      To be filled at: [facility] [address] [place] - [address] - P [phone] [place] [phone]           Date of Last Refill: 11/23/2023    Prescription Refill Protocol reviewed: Yes  Signed by Damien CHRISTELLA Dross, MA   Allergy List Reviewed and Verified: Yes    Possible medication to medication interactions reviewed: Yes    Last appt @ PCP Office: 02/20/2024     Future Appointments   Date Time Provider Department Center   08/28/2024 12:30 PM Cliche Ripley SHAUNNA, MD [facility]   02/21/2025 12:30 PM Cliche Ripley SHAUNNA, MD [facility]       MOST RECENT BLOOD  PRESSURES  BP Readings from Last 3 Encounters:   02/20/24 (!) 140/85   02/06/24 132/73   06/08/23 130/85         MOST RECENT LAB DATA  Lab Results   Component Value Date/Time    K 4.7 02/10/2024 08:34 AM    ALT 19 02/10/2024 08:34 AM    CHOL 159 02/10/2024 08:34 AM    HGB 11.9 02/10/2024 08:34 AM    HCT 34.7 02/10/2024 08:34 AM

## 2024-08-27 NOTE — Telephone Encounter (Signed)
"  Called pt for reminder of 10/21 appointment.  Pt stated she would no longer be coming, she has moved out of area with her Son since her Husband has passed.   "

## 2024-08-27 NOTE — Telephone Encounter (Signed)
"  Pt had to move with her son in [place]. She is still trying to  establish a new pcp but hasn't received a call back yet. She isn't in any need of refills right now nut she might down the road if it takes awhile to get established. Her husband pasted recently this is why she moved with her son.    [phone]  "

## 2024-08-27 NOTE — Telephone Encounter (Signed)
"  Called and left message for pt to call back when pt calls back please ask     Ask pt if they have a new PCP   Have they established with new PCP  Does pt need any medications before we take provider off the banner   "

## 2024-08-27 NOTE — Telephone Encounter (Signed)
 Noted

## 2024-08-28 ENCOUNTER — Ambulatory Visit: Payer: Medicare (Managed Care) | Attending: Internal Medicine | Primary: Internal Medicine

## 2024-09-11 MED ORDER — HYDROCHLOROTHIAZIDE 25 MG PO TABS
25 | ORAL_TABLET | Freq: Every day | ORAL | 0 refills | Status: AC
Start: 2024-09-11 — End: ?

## 2024-09-11 NOTE — Telephone Encounter (Signed)
"  Patient's name and date of birth verified at start of call.   Incoming refill request.  Caller has been notified that we require a minimum of 2 business days for refill processing. (This does not include weekends, holidays, etc.)      Rhonda [name]  08/05/1946      Confirmed best contact number:   Home Phone There is no home phone number on file.    Medications Requested:  Requested Prescriptions     Pending Prescriptions Disp Refills    hydroCHLOROthiazide  (HYDRODIURIL ) 25 MG tablet [Pharmacy Med Name: hydroCHLOROthiazide  25 MG Oral Tablet] 90 tablet 0     Sig: Take 1 tablet by mouth once daily       Preferred Pharmacy:   [facility] [address] [place] - [address] - P [phone] [place] [phone]  [address]  [place] [address]  Phone: [phone] Fax: [phone]    Has patient already contacted pharmacy to confirm no refills were on file: Yes    Notes for office regarding medication request:          Other instructions and notes:  Last visit with provider: 02/20/2024  Next scheduled visit with provider: Visit date not found  *If next visit is not scheduled, book next needed visit or document if recall was added to list prior to sending for processing*  Inform patient that this needs to be on file before sending request as we do require for them to remain up-to-date with their recommended healthcare in order to avoid any interruptions in our ability to provide ongoing care such as refills.     Patient MyChart Status:  For Active Patients - Patient has been notified that they will receive an automated notification via MyChart once their script has been processed.   For Inactive Patients - Patient is aware that we have a patient portal, MyChart, which offers many benefits such as being able to request their refills electronically and receive automated notifications when scripts are processed.  In addition, they can schedule and manage appointments, view their testing results and visit notes, and  stay connected with their care team.  Patient offered MyChart today.  Patient accepted.  (Send link for set up if accepted)  "

## 2024-09-11 NOTE — Telephone Encounter (Signed)
"  Last CMP resulted 02/10/2024. Potassium = 4.7. per [facility] guidelines this is NOT out of range. Passes protocol sending med. Sending only 90 days as pt will be due for appt    Medications Requested:  Requested Prescriptions     Pending Prescriptions Disp Refills    hydroCHLOROthiazide  (HYDRODIURIL ) 25 MG tablet [Pharmacy Med Name: hydroCHLOROthiazide  25 MG Oral Tablet] 90 tablet 0     Sig: Take 1 tablet by mouth daily       Preferred Pharmacy:   [name] [address], [place] - [address] - P [phone] - F [phone]  [address]  [place] [address]  Phone: [phone] Fax: [phone]      Date of Last Refill: 06/13/2024    Prescription Refill Protocol reviewed:YES    Allergy List Reviewed and Verified: YES    Possible medication to medication interactions reviewed: YES    Last appt @ PCP Office: 02/20/2024     No future appointments.    MOST RECENT BLOOD PRESSURES  BP Readings from Last 3 Encounters:   02/20/24 (!) 140/85   02/06/24 132/73   06/08/23 130/85         MOST RECENT LAB DATA  Lab Results   Component Value Date/Time    K 4.7 02/10/2024 08:34 AM    ALT 19 02/10/2024 08:34 AM    CHOL 159 02/10/2024 08:34 AM    HGB 11.9 02/10/2024 08:34 AM    HCT 34.7 02/10/2024 08:34 AM       Karna JULIANNA Pinch, MA  09/11/24  9:48 AM    "

## 2024-09-27 NOTE — Telephone Encounter (Signed)
"  No roi received yet- checking with medical records and will follow up Monday   "

## 2024-09-28 NOTE — Telephone Encounter (Signed)
"  Rx not sent as this was sent on 01/02/24 for 90 tabs and 3 refills for a 1 year supply. Message sent to pt to make aware.    Medications Requested:  Requested Prescriptions     Pending Prescriptions Disp Refills    pantoprazole  (PROTONIX ) 40 MG tablet [Pharmacy Med Name: Pantoprazole  Sodium 40 MG Oral Tablet Delayed Release] 90 tablet 0     Sig: Take 1 tablet by mouth once daily       Preferred Pharmacy:   [facility] [address], [place] - [address] - P [phone] - F [phone]  [address]  [place] [address]  Phone: [phone] Fax: [phone]      Date of Last Refill: 01/02/24 90 tabs 3 refills     Prescription Refill Protocol reviewed: Yes   Name from pharmacy: Pantoprazole  Sodium 40 MG Oral Tablet Delayed Release         Will file in chart as: pantoprazole  (PROTONIX ) 40 MG tablet    Sig: Take 1 tablet by mouth once daily    Disp: 90 tablet    Refills: 0    Start: 09/27/2024    Class: Normal    Non-formulary    Last ordered: 9 months ago (01/02/2024) by Ripley SHAUNNA Cliche, MD    Last refill: 09/27/2024    Rx #: [id number]    Proton Pump Inhibitor Protocol Failed11/20/2025 08:41 AM    No history of chronic kidney disease (CKD)    Visit with authorizing provider in past 9 months or upcoming 90 days    No positive pregnancy test or active pregnancy on record in past 12 months    No history of osteoporosis      To be filled at: [facility] [address] [facility]           Allergy List Reviewed and Verified: Yes    Possible medication to medication interactions reviewed: Yes    Last appt @ PCP Office: 02/20/2024     No future appointments.    MOST RECENT BLOOD PRESSURES  BP Readings from Last 3 Encounters:   02/20/24 (!) 140/85   02/06/24 132/73   06/08/23 130/85         MOST RECENT LAB DATA  Lab Results   Component Value Date/Time    K 4.7 02/10/2024 08:34 AM    ALT 19 02/10/2024 08:34 AM    CHOL 159 02/10/2024 08:34 AM    HGB 11.9 02/10/2024 08:34 AM    HCT 34.7 02/10/2024 08:34 AM       "

## 2024-10-01 NOTE — Telephone Encounter (Signed)
"  Unable to reach the patient and we have not received an ROI.   Closing encounter and I have removed the banner     "

## 2024-10-05 NOTE — Telephone Encounter (Signed)
"  See message from 08/27/24 pt has moved out of area, no longer with us  as a patient, refusing RX    Signed by Damien CHRISTELLA Dross, MA   "

## 2024-10-06 ENCOUNTER — Encounter (INDEPENDENT_AMBULATORY_CARE_PROVIDER_SITE_OTHER): Payer: Self-pay | Admitting: Family Medicine

## 2024-10-10 MED ORDER — LISINOPRIL 40 MG PO TABS
40 | ORAL_TABLET | Freq: Every day | ORAL | 3 refills | 90.00000 days | Status: AC
Start: 2024-10-10 — End: ?

## 2024-10-10 NOTE — Telephone Encounter (Signed)
"  Pt states that she does not have her new PCP appt until March. She states Dr. Arnetha told her he would cover her refills until that time     Please advise Pt     [phone]   "

## 2024-10-10 NOTE — Telephone Encounter (Signed)
"  NO LONGER OUR PT.    Medications Requested:  Requested Prescriptions     Pending Prescriptions Disp Refills    lisinopril  (PRINIVIL ;ZESTRIL ) 40 MG tablet [Pharmacy Med Name: Lisinopril  40 MG Oral Tablet] 180 tablet 0     Sig: Take 2 tablets by mouth once daily       Preferred Pharmacy:   [facility] [address] [place] - [address] - P [phone] - F [phone]  [address]  [place] [address]  Phone: [phone] Fax: [phone]      Date of Last Refill: 10/05/23    Prescription Refill Protocol reviewed:YES    Allergy List Reviewed and Verified: YES    Possible medication to medication interactions reviewed: YES    Last appt @ PCP Office: 02/20/2024     No future appointments.    MOST RECENT BLOOD PRESSURES  BP Readings from Last 3 Encounters:   02/20/24 (!) 140/85   02/06/24 132/73   06/08/23 130/85         MOST RECENT LAB DATA  Lab Results   Component Value Date/Time    K 4.7 02/10/2024 08:34 AM    ALT 19 02/10/2024 08:34 AM    CHOL 159 02/10/2024 08:34 AM    HGB 11.9 02/10/2024 08:34 AM    HCT 34.7 02/10/2024 08:34 AM     "

## 2024-10-10 NOTE — Addendum Note (Signed)
"  Addended by: ARNETHA RIPLEY SQUIBB on: 10/10/2024 10:37 AM     Modules accepted: Orders    "

## 2024-10-10 NOTE — Telephone Encounter (Signed)
"  Call to pt - she is aware that meds have been sent.         Rhonda [name] [place], MA  10/10/24  10:47 AM    "

## 2024-10-10 NOTE — Telephone Encounter (Signed)
 Script sent  Let her know

## 2024-12-11 NOTE — Telephone Encounter (Signed)
 NO PCP on file- removing this refill request- closing this note          Rhonda JULIANNA Pinch, MA  12/11/24  9:27 AM
